# Patient Record
Sex: Male | Born: 1990 | Race: Black or African American | Hispanic: No | Marital: Single | Smoking: Never smoker
Health system: Southern US, Community
[De-identification: ages and names within clinical notes are randomized; demographics above are authoritative.]

## PROBLEM LIST (undated history)

## (undated) DIAGNOSIS — F319 Bipolar disorder, unspecified: Secondary | ICD-10-CM

## (undated) DIAGNOSIS — K5120 Ulcerative (chronic) proctitis without complications: Secondary | ICD-10-CM

## (undated) DIAGNOSIS — Z8619 Personal history of other infectious and parasitic diseases: Secondary | ICD-10-CM

## (undated) DIAGNOSIS — F909 Attention-deficit hyperactivity disorder, unspecified type: Secondary | ICD-10-CM

## (undated) DIAGNOSIS — K5190 Ulcerative colitis, unspecified, without complications: Secondary | ICD-10-CM

## (undated) HISTORY — DX: Ulcerative colitis, unspecified, without complications: K51.90

## (undated) HISTORY — DX: Attention-deficit hyperactivity disorder, unspecified type: F90.9

## (undated) HISTORY — PX: EXTERNAL EAR SURGERY: SHX627

## (undated) HISTORY — DX: Personal history of other infectious and parasitic diseases: Z86.19

## (undated) HISTORY — DX: Bipolar disorder, unspecified: F31.9

---
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
## 2009-07-24 ENCOUNTER — Ambulatory Visit: Payer: Self-pay | Admitting: Family Medicine

## 2009-07-24 LAB — CONVERTED CEMR LAB
ALT: 27 units/L (ref 0–53)
AST: 42 units/L — ABNORMAL HIGH (ref 0–37)
Albumin: 4 g/dL (ref 3.5–5.2)
Alkaline Phosphatase: 160 units/L — ABNORMAL HIGH (ref 39–117)
BUN: 16 mg/dL (ref 6–23)
Basophils Absolute: 0 10*3/uL (ref 0.0–0.1)
Basophils Relative: 0.1 % (ref 0.0–3.0)
Bilirubin, Direct: 0 mg/dL (ref 0.0–0.3)
CO2: 30 meq/L (ref 19–32)
Calcium: 9.4 mg/dL (ref 8.4–10.5)
Chloride: 107 meq/L (ref 96–112)
Creatinine, Ser: 1.1 mg/dL (ref 0.4–1.5)
Eosinophils Absolute: 0.1 10*3/uL (ref 0.0–0.7)
Eosinophils Relative: 1.8 % (ref 0.0–5.0)
GFR calc non Af Amer: 110.76 mL/min (ref >60)
Glucose, Bld: 92 mg/dL (ref 70–99)
HCT: 40.6 % (ref 39.0–52.0)
Hemoglobin: 13.8 g/dL (ref 13.0–17.0)
Lymphocytes Relative: 49.5 % — ABNORMAL HIGH (ref 12.0–46.0)
Lymphs Abs: 3.6 10*3/uL (ref 0.7–4.0)
MCHC: 34.1 g/dL (ref 30.0–36.0)
MCV: 94.8 fL (ref 78.0–100.0)
Monocytes Absolute: 0.6 10*3/uL (ref 0.1–1.0)
Monocytes Relative: 8 % (ref 3.0–12.0)
Neutro Abs: 3 10*3/uL (ref 1.4–7.7)
Neutrophils Relative %: 40.6 % — ABNORMAL LOW (ref 43.0–77.0)
Platelets: 227 10*3/uL (ref 150.0–400.0)
Potassium: 4.1 meq/L (ref 3.5–5.1)
RBC: 4.28 M/uL (ref 4.22–5.81)
RDW: 11.8 % (ref 11.5–14.6)
Sodium: 142 meq/L (ref 135–145)
TSH: 2.04 microintl units/mL (ref 0.35–5.50)
Total Bilirubin: 1 mg/dL (ref 0.3–1.2)
Total Protein: 6.8 g/dL (ref 6.0–8.3)
WBC: 7.3 10*3/uL (ref 4.5–10.5)

## 2009-07-30 ENCOUNTER — Ambulatory Visit: Payer: Self-pay | Admitting: Family Medicine

## 2009-12-04 ENCOUNTER — Emergency Department (HOSPITAL_COMMUNITY): Admission: EM | Admit: 2009-12-04 | Discharge: 2009-12-04 | Payer: Self-pay | Admitting: Emergency Medicine

## 2010-07-09 ENCOUNTER — Encounter (INDEPENDENT_AMBULATORY_CARE_PROVIDER_SITE_OTHER): Payer: Self-pay | Admitting: *Deleted

## 2010-09-09 ENCOUNTER — Ambulatory Visit: Payer: Self-pay | Admitting: Family Medicine

## 2010-12-22 ENCOUNTER — Ambulatory Visit: Admit: 2010-12-22 | Payer: Self-pay | Admitting: Family Medicine

## 2010-12-23 ENCOUNTER — Encounter: Payer: Self-pay | Admitting: Family Medicine

## 2010-12-23 ENCOUNTER — Ambulatory Visit
Admission: RE | Admit: 2010-12-23 | Discharge: 2010-12-23 | Payer: Self-pay | Source: Home / Self Care | Attending: Family Medicine | Admitting: Family Medicine

## 2010-12-24 LAB — CONVERTED CEMR LAB: Chlamydia, Swab/Urine, PCR: NEGATIVE

## 2011-01-04 LAB — CONVERTED CEMR LAB
Bilirubin Urine: NEGATIVE
Blood in Urine, dipstick: NEGATIVE
Chlamydia, Swab/Urine, PCR: POSITIVE — AB
GC Probe Amp, Urine: NEGATIVE
Glucose, Urine, Semiquant: NEGATIVE
Ketones, urine, test strip: NEGATIVE
Nitrite: NEGATIVE
Specific Gravity, Urine: >=1.03
Urobilinogen, UA: 0.2
WBC Urine, dipstick: NEGATIVE
pH: 5

## 2011-01-08 NOTE — Assessment & Plan Note (Signed)
Summary: BURNING WHEN URINATE X  1 WK .Marland KitchenCYD   Vital Signs:  Patient profile:   20 year old male Height:      69.5 inches Weight:      172 pounds BMI:     25.13 Temp:     97.8 degrees F oral Pulse rate:   68 / minute Pulse rhythm:   regular BP sitting:   122 / 82  (left arm) Cuff size:   regular  Vitals Entered By: Sherrian Divers CMA Deborra Medina) (September 09, 2010 10:38 AM) CC: burning upon urination   History of Present Illness: 20 yo here for dysuria.  Stared one week ago, not occuring every time he urinates. No back pain, fevers, chills, nausea or vomiting. Never had anything like this before. Sexually active but wears condoms with each encounter. No discharge from his penis or penile sores. No visible hematuria.  Current Medications (verified): 1)  None  Allergies (verified): No Known Drug Allergies  Past History:  Past Surgical History: Last updated: 07/30/2009 CHARTER ADMISSION:X 1 HBZJ:(6967) L EAR KELOID EXCISION (DR [facility]) 10/2007  Family History: Last updated: 07/30/2009 Father:A 38 (Estranged) [place] ; ELEVATED HEART RATE H/O OF DRUG PROBLEMS  Mother: A 63  RAD; BRONCHITIS Siblings: 1 HALF SISTER DECEASED 8 YOA MVA (Zohan WAS 7 YOA) CV: +MGGM DECEASED FROM MI ;ANGINA HBP: + MOTHERS FAMILY DM: IN MATERNAL FAMILY / M UNCLE GOUT/ARTHRITIS: PROSTATE CANCER:   MGM LUNG CANCER MET. TO BRAIN BREAST/OVARIAN/UTERINE CANCER:  COLON CANCER: MG AUNT DEPRESSION: +MGM DRUG ABUSE: + FATHER OTHER: + STROKE MGM  Social History: Last updated: 07/30/2009 Mother:LIVES WITH MOTHER Father: DAD IN [place]// DOES NOT SEE ON A REGULAR BASIS Siblings:  School name: GRADUATED FROM [employer]   Hobbies: BB, FB  Risk Factors: Caffeine Use: 0 (07/30/2009) Exercise: yes (07/30/2009)  Risk Factors: Smoking Status: quit (07/30/2009) Packs/Day: minimal exposure in the past. (07/30/2009)  Review of Systems      See HPI General:  Denies chills and  fever. GI:  Denies abdominal pain, nausea, and vomiting. GU:  Complains of dysuria; denies discharge, genital sores, hematuria, incontinence, urinary frequency, and urinary hesitancy. MS:  Denies low back pain.  Physical Exam  General:  Well appearing adolescent,no acute distress Abdomen:  Bowel sounds positive,abdomen soft and non-tender without masses, organomegaly or hernias noted. No CVA tenderness Psych:  Cognition and judgment appear intact. Alert and cooperative with normal attention span and concentration. No apparent delusions, illusions, hallucinations, good eye contact, good interaction.   Impression & Recommendations:  Problem # 1:  DYSURIA (ICD-788.1) Assessment New UA neg for infection, will send for culture. Will also check urine GC/Chlamydia.  Orders: UA Dipstick w/o Micro (manual) (81002) T-Chlamydia & GC Probe, Urine ([id number]) T-Culture, Urine ([id number])  Problem # 2:  ? of SEXUALLY TRANSMITTED DISEASE, EXPOSURE TO (ICD-V01.6) Assessment: New Check HIV and RPR as well today. Discussed sexual activity and STD risk.   Orders: Venipuncture (02585) T-HIV Antibody  (Reflex) [id number]) T-RPR (Syphilis) [phone])  Prior Medications (reviewed today): None Current Allergies (reviewed today): No known allergies   Laboratory Results   Urine Tests  Date/Time Received: September 09, 2010 10:48 AM   Routine Urinalysis   Color: yellow Appearance: Cloudy Glucose: negative   (Normal Range: Negative) Bilirubin: negative   (Normal Range: Negative) Ketone: negative   (Normal Range: Negative) Spec. Gravity: >=1.030   (Normal Range: 1.003-1.035) Blood: negative   (Normal Range: Negative) pH: 5.0   (Normal Range:  5.0-8.0) Protein: trace   (Normal Range: Negative) Urobilinogen: 0.2   (Normal Range: 0-1) Nitrite: negative   (Normal Range: Negative) Leukocyte Esterace: negative   (Normal Range: Negative)

## 2011-01-08 NOTE — Assessment & Plan Note (Signed)
Summary: DIARRHEA/CLE   Vital Signs:  Patient Profile:   20 Years Old Male Weight:      147.38 pounds Temp:     98 degrees F oral Pulse rate:   72 / minute Pulse rhythm:   regular BP sitting:   102 / 64  (left arm) Cuff size:   regular  Vitals Entered ByMarland Kitchen Christena Deem (January 04, 2008 4:05 PM)                 Chief Complaint:  Diarrhea.  Acute Pediatric Visit History:      The patient presents with abdominal pain, diarrhea, and headache.  These symptoms began one day ago.  He is not having fever.  Other comments include: LIGHTHEADNESS MOTHER WITH VIRUS , SICK TODAY.        There have been 4 diarrheal stools and 0 episodes of vomiting in the past 24 hours.        The abdominal pain has been present for the past day.  There is a history of anorexia.  The patient has no history of bloody stools.        Comments: 6/10 on pain scale in RUQ, diarhea started first then pain, no relief with BMs, constant.        Current Allergies (reviewed today): No known allergies      Physical Exam  General:      Well appearing adolescent,no acute distress Lungs:      Clear to ausc, no crackles, rhonchi or wheezing, no grunting, flaring or retractions  Heart:      RRR without murmur  Abdomen:      mild umbilical pain, right lateral, no rebound, no guarding no toxic appearing     Impression & Recommendations:  Problem # 1:  GASTROENTERITIS (ICD-558.9) likely viral.  Discussed s/s of appendicitis which are not present now, but he will call if severe RLQ pain, N, vomiting and fever.  In meantime treat supportively with plenty of fluids, rest.  Call if not improving. Orders: Est. Patient Level III (36859)       ] Prior Medications (reviewed today): None Current Allergies (reviewed today): No known allergies  Current Medications (including changes made in today's visit):  None

## 2011-01-08 NOTE — Letter (Signed)
Summary: Nadara Eaton letter  [place] at [facility]  [address] [place], [place] [address]   Phone: [phone]  Fax: [phone]       07/09/2010 MRN: [id number]  [facility] [address] [place], [place]  [address]  Dear Mr. [facility] [place] [place] announce the retirement of Arta Silence, M.D., from full-time practice at the [facility] office effective June 05, 2010 and his plans of returning part-time.  It is important to Dr. Hetty Ely and to our practice that you understand that [facility] has seven physicians in our office for your health care needs.  We will continue to offer the same exceptional care that you have today.    Dr. Hetty Ely has spoken to many of you about his plans for retirement and returning part-time in the fall.   We will continue to work with you through the transition to schedule appointments for you in the office and meet the high standards that [facility] is committed to.   Again, it is with great pleasure that we share the news that Dr. Hetty Ely will return to [facility] in October of 2011 with a reduced schedule.    If you have any questions, or would like to request an appointment with one of our physicians, please call us at [phone] and press the option for Scheduling an appointment.  We take pleasure in providing you with excellent patient care and look forward to seeing you at your next office visit.  Our [facility] Physicians are:  Tillman Abide, M.D. Laurita Quint, M.D. Roxy Manns, M.D. Kerby Nora, M.D. Hannah Beat, M.D. Ruthe Mannan, M.D. We proudly welcomed Raechel Ache, M.D. and Eustaquio Boyden, M.D. to the practice in July/August 2011.  Sincerely,  [facility]

## 2011-01-08 NOTE — Letter (Signed)
Summary: Out of School  [place] at [facility]  [address] [place],  [address]   Phone: [phone]  Fax: [phone]    January 04, 2008   Student:  Andres Labrum    To Whom It May Concern:   For Medical reasons, please excuse the above named student from school for the following dates:  Start:   January 04, 2008  End:    January 04, 2008  If you need additional information, please feel free to contact our office.   Sincerely,    Jinny Sanders, M.D.  AEB:lsf    ****This is a legal document and cannot be tampered with.  Schools are authorized to verify all information and to do so accordingly.

## 2011-01-08 NOTE — Consult Note (Signed)
Summary: [facility]/Consultation Bee   Imported By: Genice Rouge 04/18/2008 14:39:54  _____________________________________________________________________  External Attachment:    Type:   Image     Comment:   External Document

## 2011-01-08 NOTE — Progress Notes (Signed)
Summary: ? flu  Phone Note Call from Patient   Caller: Mom- blanche [phone] Call For: Raenette Rover MD Summary of Call: Pt suddenly came down with body aches around lunch today, and says he feels hot. Chest is hurting.  Mom is asking if tamiflu can be called in to [facility].  Advised fluids, rest. Initial call taken by: Marty Heck,  November 07, 2008 2:42 PM  Follow-up for Phone Call        I spoke with Bradford directly and discussed flu sxs...he could very well have flu. Stay home away from others as much as possible, keep fluid intake up and call if develops productive cough with fever that doesn't go away. He will feel like he has been run over by a Mack truck if he truly has the flu. Follow-up by: Raenette Rover MD,  November 07, 2008 5:17 PM

## 2011-01-08 NOTE — Assessment & Plan Note (Signed)
Summary: CPX/CLE  r/s from 12/25/10   Vital Signs:  Patient profile:   20 year old male Height:      70 inches Weight:      175.8 pounds Temp:     98.1 degrees F oral Pulse rate:   66 / minute Pulse rhythm:   regular BP sitting:   100 / 60  (left arm) Cuff size:   regular  Vitals Entered By: Sydell Axon LPN (December 23, 2010 3:31 PM) CC: 30 Minute checkup   History of Present Illness: Pt here for Comp Exam. He is going to [employer] for two years and then hoping to transfer to [employer] to play basketball. He has not totally given up going into AF. He is wondering about going pro in BB. His uncle knows  the head coach at [employer]. His uncle had played for [employer]. He has congestion today. He had been checked for Chlamydia and was positive. He was treated and is to be rechecked.  His girlfriend has had 4 miscarriages thusfar, she is 40. She had Chlamydia.   Preventive Screening-Counseling & Management  Alcohol-Tobacco     Alcohol drinks/day: <1     Alcohol type: [place] (Vodka)      Smoking Status: quit     Packs/Day: minimal exposure in the past.  Caffeine-Diet-Exercise     Caffeine use/day: 0     Does Patient Exercise: yes     Type of exercise: gym      Times/week: 6  Problems Prior to Update: 1)  ? 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 Take As Directed  Current Medications (verified): 1)  None  Allergies: No Known Drug Allergies  Past History:  Past Surgical History: Last updated: 07/30/2009 CHARTER ADMISSION:X 1 OACZ:(6606) L EAR KELOID EXCISION (DR [facility]) 10/2007  Family History: Last updated: 12/23/2010 Father:A 51 (Estranged) [employer] [employer] ; ELEVATED HEART RATE PRESUMED H/O  HEPATITIS H/O OF DRUG PROBLEMS  Mother: A 48  RAD; BRONCHITIS Siblings: 1 HALF SISTER DECEASED 8 YOA MVA ([place] WAS 7 YOA) CV: +MGGM DECEASED FROM MI ;ANGINA HBP: + MOTHERS FAMILY DM: IN MATERNAL FAMILY / M UNCLE GOUT/ARTHRITIS: PROSTATE CANCER:   MGM LUNG CANCER MET. TO BRAIN BREAST/OVARIAN/UTERINE CANCER:  COLON CANCER: MG AUNT DEPRESSION: +MGM DRUG ABUSE: + FATHER OTHER: + STROKE MGM  Social History: Last updated: 12/23/2010 Mother:LIVES WITH MOTHER Father: DAD IN [place]// DOES NOT SEE ON A REGULAR BASIS Siblings:  School name: GRADUATED FROM [employer]   Going to [employer] Hobbies: BB, FB  Risk Factors: Alcohol Use: <1 (12/23/2010) Caffeine Use: 0 (12/23/2010) Exercise: yes (12/23/2010)  Risk Factors: Smoking Status: quit (12/23/2010) Packs/Day: minimal exposure in the past. (12/23/2010)  Family History: Father:A 51 (Estranged) [employer] PO[employer]IVER [employer] ; ELEVATED HEART RATE PRESUMED H/O HEPATITIS H/O OF DRUG PROBLEMS  Mother: A 48  RAD; BRONCHITIS Siblings: 1 HALF SISTER DECEASED 8 YOA MVA (Haik WAS 7 YOA) CV: +MGGM DECEASED FROM MI ;ANGINA HBP: + MOTHERS FAMILY DM: IN MATERNAL FAMILY / M UNCLE GOUT/ARTHRITIS: PROSTATE CANCER:   MGM LUNG CANCER MET. TO BRAIN BREAST/OVARIAN/UTERINE CANCER:  COLON CANCER: MG AUNT DEPRESSION: +MGM DRUG ABUSE: + FATHER OTHER: + STROKE MGM  Social History: Mother:LIVES WITH MOTHER  Father: DAD IN [place]// DOES NOT SEE ON A REGULAR BASIS Siblings:  School name: GR[employer]UATED FROM [employer]   Going to [employer] Hobbies: BB, FB  Review of Systems General:  Denies chills, fatigue, fever, sweats, weakness, and weight loss. Eyes:  Denies blurring, discharge, and eye pain. ENT:  Denies decreased hearing, ear discharge, earache, and ringing in ears. CV:  Denies chest pain or discomfort, fainting, fatigue, shortness of breath with exertion, swelling of feet, and swelling of hands. Resp:  Denies cough, shortness of breath, and  wheezing. GI:  Denies abdominal pain, bloody stools, change in bowel habits, constipation, dark tarry stools, diarrhea, indigestion, loss of appetite, nausea, vomiting, vomiting blood, and yellowish skin color. GU:  Denies discharge, dysuria, nocturia, and urinary frequency. MS:  Denies joint pain, low back pain, muscle aches, cramps, and stiffness. Derm:  Denies dryness, itching, and rash. Neuro:  Denies numbness, poor balance, tingling, and tremors.  Physical Exam  General:  Well-developed,well-nourished,in no acute distress; alert,appropriate and cooperative throughout examination, attentive and responsive. Head:  Normocephalic and atraumatic without obvious abnormalities. No apparent alopecia. Sinuses NT. Eyes:  Conjunctiva clear bilaterally.  Ears:  External ear exam shows no significant lesions or deformities.  Otoscopic examination reveals clear canals, tympanic membranes are intact bilaterally without bulging, retraction, inflammation or discharge. Hearing is grossly normal bilaterally. Nose:  External nasal examination shows no deformity or inflammation. Nasal mucosa are pink and moist without lesions or exudates. Mouth:  Oral mucosa and oropharynx without lesions or exudates.  Teeth in good repair. Neck:  No deformities, masses, or tenderness noted. Chest Wall:  No deformities, masses, tenderness or gynecomastia noted. Breasts:  No masses or gynecomastia noted Lungs:  Normal respiratory effort, chest expands symmetrically. Lungs are clear to auscultation, no crackles or wheezes. Heart:  Normal rate and regular rhythm. S1 and S2 normal without gallop, murmur, click, rub or other extra sounds. Abdomen:  Bowel sounds positive,abdomen soft and non-tender without masses, organomegaly or hernias noted. No CVA tenderness Rectal:  Not done due to age. Genitalia:  Testes bilaterally descended without nodularity, tenderness or masses. No scrotal masses or lesions. No penis lesions or urethral  discharge. Tanner V. "mass" felt is epididymus, NT. Msk:  No deformity or scoliosis noted of thoracic or lumbar spine.   Pulses:  R and L carotid,radial,femoral,dorsalis pedis and posterior tibial pulses are full and equal bilaterally Extremities:  No clubbing, cyanosis, edema, or deformity noted with normal full range of motion of all joints.   Neurologic:  No cranial nerve deficits noted. Station and gait are normal. Sensory, motor and coordinative functions appear intact. Skin:  Intact without suspicious lesions or rashes, multiple tattos. Cervical Nodes:  No lymphadenopathy noted Inguinal Nodes:  No significant adenopathy Psych:  Cognition and judgment appear intact. Alert and cooperative with normal attention span and concentration. No apparent delusions, illusions, hallucinations, good eye contact, good interaction.   Impression & Recommendations:  Problem # 1:  HEALTH MAINTENANCE EXAM (ICD-V70.0) Assessment Comment Only  Reviewed preventive care protocols, scheduled due services, and updated immunizations.  Problem # 2:  KELOID SCAR, STEROID INJECTIONS (ICD-701.4) Assessment: Improved Flat, in front of left earlobe.  Problem # 3:  ADHD (ICD-314.01) Assessment: Unchanged Seems stable. Going to school at [employer] and motivated.  Problem # 4:  ? of SEXUALLY TRANSMITTED DISEASE, EXPOSURE TO (ICD-V01.6) Assessment: Improved Recieved trmt. Now here for recheck. Urine probe sent. Will report via phone tree. Orders: T-Chlamydia  Probe, urine [phone])  Patient Instructions: 1)  TOC urine sent for Chlamydia. 2)  RTC as needed.   Orders Added: 1)  T-Chlamydia  Probe, urine [phone] 2)  Est. Patient 18-39 years [[id number]]    Prior Medications (reviewed today): None Current Allergies (reviewed today): No known allergies

## 2011-01-08 NOTE — Assessment & Plan Note (Signed)
Summary: PAIN L BREAST/CLE   Vital Signs:  Patient Profile:   20 Years Old Male Height:     69.50 inches (140.97 cm) Weight:      152 pounds Temp:     96 degrees F tympanic Pulse rate:   68 / minute Pulse rhythm:   regular BP sitting:   110 / 70  (left arm) Cuff size:   regular  Vitals Entered ByGwinda Maine (March 06, 2008 4:07 PM)                 Chief Complaint:  BREAST PAIN// AND OTHER PROBLEMS.  History of Present Illness: Pt is here with mother. He was seen a few years ago (03/16/05)for left breast mass felt to be a cyst after hormonal breast hypertrophy the previous year resolved spontaneously on its own. He has done well with that but c/o pain in the left breast around the areolar area for the last few weeks and demonstrates where it hurts by squeezing the left areola. He denies trauma. He also complains of frontal headaches and low back pain.  His mother relates that he spendas lots of time lounging in various positions on his bed going back and forth between computer screen and television screen playing computer games. He denies visual acuity problems and does not think his frontal headaches are related tohis vision. He does do a fair amount of lat eye movement going back and forth between the two screens.    Prior Medications Reviewed Using: Patient Recall  Current Allergies: No known allergies         Impression & Recommendations:  Problem # 1:  BREAST PAIN, LEFT (ICD-611.71) Reassured, no pathol;ogical condition found. Orders: Est. Patient Level IV (99872)   Problem # 2:  BACK PAIN, LUMBAR (ICD-724.2) Appears benign but due to Chiropracter trmt, will have pt see ortho for diagnostic xrays to eval and treat. Orders: Orthopedic Referral (Ortho) Est. Patient Level IV ([id number])   Problem # 3:  FACIAL PAIN, FRONTAL (ICD-784.0) Assessment: New Suggested using guaif to relieve any congestion in the area and as needed tyl for discomfort. Also suggested  that the pt get more regular aerobic exercise, playing ball, running, etc. Orders: Est. Patient Level IV (76184)    Patient Instructions: 1)  Refer to [facility]. 2)  For congestion, take: 3)  GUAIFENESIN  624m by mouth AM and NOON   4)    ROBITUSSIN PLAIN (NO LETTERS, NO NAMES) two tablespoons  or 5)    [facility] MUCOUS RELIEF EXPECTORANT (400 mg) 11/2 TABS  or 6)    GUAIFENESIN (200 MG) 3 TABS  7)  Drink Lots of Fluids.        8)  45 mins spent with pt and mother.                ]

## 2011-01-08 NOTE — Assessment & Plan Note (Signed)
Summary: CPX/CLE   Vital Signs:  Patient profile:   20 year old male Height:      69.5 inches Weight:      168 pounds BMI:     24.54 Temp:     98 degrees F Pulse rate:   60 / minute Pulse rhythm:   regular BP sitting:   120 / 80  (left arm) Cuff size:   regular  Vitals Entered By: Lewanda Rife LPN (July 30, 2009 1:58 PM)  History of Present Illness: Pt here for CXomp Exam, hasn't had one in a while. He has no complaints and feels well. 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 He wants to enlist in the [employer].    Preventive Screening-Counseling & Management  Alcohol-Tobacco     Smoking Status: quit     Packs/Day: minimal exposure in the past.  Caffeine-Diet-Exercise     Caffeine use/day: 0     Does Patient Exercise: yes     Type of exercise: gym      Times/week: 6  Problems Prior to Update: 1)  Facial Pain, Frontal  (ICD-784.0) 2)  Breast Pain, Left  (ICD-611.71) 3)  Back Pain, Lumbar  (ICD-724.2) 4)  Bipolar Disorder Unspecified  (ICD-296.80) 5)  Adhd  (ICD-314.01)  Medications Prior to Update: 1)  None  Allergies (verified): No Known Drug Allergies  Past History:  Family History: Last updated: 07/30/2009 Father:A 50 (Estranged) UNCH POOR LIVER FCTN ; ELEVATED HEART RATE H/O OF DRUG PROBLEMS  Mother: A 46  RAD; BRONCHITIS Siblings: 1 HALF SISTER DECEASED 8 YOA MVA (Sadler WAS 7 YOA) CV: +MGGM DECEASED FROM MI ;ANGINA HBP: + MOTHERS FAMILY DM: IN MATERNAL FAMILY / M UNCLE GOUT/ARTHRITIS: PROSTATE CANCER:   MGM LUNG CANCER MET. TO BRAIN BREAST/OVARIAN/UTERINE CANCER:  COLON CANCER: MG AUNT DEPRESSION: +MGM DRUG ABUSE: + FATHER OTHER: + STROKE MGM  Social History: Last updated: 07/30/2009 Mother:LIVES WITH MOTHER Father: DAD IN [place]// DOES NOT SEE ON A REGULAR BASIS Siblings:  School name: GRADUATED FROM [employer], [employer]   Hobbies: BB, FB  Risk Factors: Caffeine Use: 0 (07/30/2009) Exercise: yes  (07/30/2009)  Risk Factors: Smoking Status: quit (07/30/2009) Packs/Day: minimal exposure in the past. (07/30/2009)  Past Surgical History: CHARTER ADMISSION:X 1 ZOXW:(9604) L EAR KELOID EXCISION (DR [place]) 10/2007  Family History: Father:A 50 (Estranged) UNCH POOR LIVER FCTN ; ELEVATED HEART RATE H/O OF DRUG PROBLEMS  Mother: A 46  RAD; BRONCHITIS Siblings: 1 HALF SISTER DECEASED 8 YOA MVA (Trashawn WAS 7 YOA) CV: +MGGM DECEASED FROM MI ;ANGINA HBP: + MOTHERS FAMILY DM: IN MATERNAL FAMILY / M UNCLE GOUT/ARTHRITIS: PROSTATE CANCER:   MGM LUNG CANCER MET. TO BRAIN BREAST/OVARIAN/UTERINE CANCER:  COLON CANCER: MG AUNT DEPRESSION: +MGM DRUG ABUSE: + FATHER OTHER: + STROKE MGM  Social History: Mother:LIVES WITH MOTHER Father: DAD IN [place]// DOES NOT SEE ON A REGULAR BASIS Siblings:  School name: GRADUATED FROM [employer], [employer]   Hobbies: BB, FB Smoking Status:  quit Packs/Day:  minimal exposure in the past. Caffeine use/day:  0 Does Patient Exercise:  yes  Review of Systems General:  Denies chills, fatigue, fever, loss of appetite, malaise, sleep disorder, sweats, weakness, and weight loss. Eyes:  Denies blurring, discharge, double vision, eye irritation, eye pain, halos, itching, light sensitivity, red eye, vision loss-1 eye, and vision loss-both eyes. ENT:  Denies decreased hearing, difficulty swallowing, ear discharge, earache, hoarseness, nasal congestion, nosebleeds, postnasal drainage, ringing in ears,  sinus pressure, and sore throat. CV:  Denies bluish discoloration of lips or nails, chest pain or discomfort, difficulty breathing at night, difficulty breathing while lying down, fainting, fatigue, leg cramps with exertion, lightheadness, near fainting, palpitations, shortness of breath with exertion, swelling of feet, swelling of hands, and weight gain. Resp:  Denies chest discomfort, chest pain with inspiration, cough, coughing up blood, excessive snoring,  hypersomnolence, morning headaches, pleuritic, shortness of breath, sputum productive, and wheezing. GI:  Denies abdominal pain, bloody stools, change in bowel habits, constipation, dark tarry stools, diarrhea, excessive appetite, gas, hemorrhoids, indigestion, loss of appetite, nausea, vomiting, vomiting blood, and yellowish skin color. GU:  Denies decreased libido, discharge, dysuria, erectile dysfunction, genital sores, hematuria, incontinence, nocturia, urinary frequency, and urinary hesitancy. MS:  Denies joint pain, joint redness, joint swelling, loss of strength, low back pain, mid back pain, muscle aches, muscle , cramps, muscle weakness, stiffness, and thoracic pain. Neuro:  Denies brief paralysis, difficulty with concentration, disturbances in coordination, falling down, headaches, inability to speak, memory loss, numbness, poor balance, seizures, sensation of room spinning, tingling, tremors, visual disturbances, and weakness.  Physical Exam  General:  Well appearing adolescent,no acute distress Head:  Normocephalic and atraumatic without obvious abnormalities. No apparent alopecia or balding. Sinuses NT. Eyes:  Conjunctiva clear bilaterally.  Ears:  External ear exam shows no significant lesions or deformities.  Otoscopic examination reveals clear canals, tympanic membranes are intact bilaterally without bulging, retraction, inflammation or discharge. Hearing is grossly normal bilaterally. Nose:  External nasal examination shows no deformity or inflammation. Nasal mucosa are pink and moist without lesions or exudates. Mouth:  Oral mucosa and oropharynx without lesions or exudates.  Teeth in good repair. Neck:  No deformities, masses, or tenderness noted. Chest Wall:  No deformities, masses, tenderness or gynecomastia noted. Breasts:  No masses or gynecomastia noted Lungs:  Normal respiratory effort, chest expands symmetrically. Lungs are clear to auscultation, no crackles or  wheezes. Heart:  Normal rate and regular rhythm. S1 and S2 normal without gallop, murmur, click, rub or other extra sounds. Abdomen:  Bowel sounds positive,abdomen soft and non-tender without masses, organomegaly or hernias noted. Genitalia:  Testes bilaterally descended without nodularity, tenderness or masses. No scrotal masses or lesions. No penis lesions or urethral discharge. Tanner V. Msk:  No deformity or scoliosis noted of thoracic or lumbar spine.   Pulses:  R and L carotid,radial,femoral,dorsalis pedis and posterior tibial pulses are full and equal bilaterally Extremities:  No clubbing, cyanosis, edema, or deformity noted with normal full range of motion of all joints.   Neurologic:  No cranial nerve deficits noted. Station and gait are normal. Plantar reflexes are down-going bilaterally. DTRs are symmetrical throughout. Sensory, motor and coordinative functions appear intact. Skin:  Intact without suspicious lesions or rashes, 3 tattos. Cervical Nodes:  No lymphadenopathy noted Inguinal Nodes:  No significant adenopathy Psych:  Cognition and judgment appear intact. Alert and cooperative with normal attention span and concentration. No apparent delusions, illusions, hallucinations, good eye contact, good interaction.   Impression & Recommendations:  Problem # 1:  HEALTH MAINTENANCE EXAM (ICD-V70.0) Assessment Comment Only Will need Tdap next time seen.  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  Problem # 4:  ADHD (ICD-314.01) Assessment: Improved Seems well adjusted at this point. Able to sit and talk very easily.  Prior Medications (reviewed today): None Current Allergies (reviewed today): No  known allergies

## 2011-03-09 LAB — POCT I-STAT, CHEM 8
BUN: 17 mg/dL (ref 6–23)
Calcium, Ion: 1.09 mmol/L — ABNORMAL LOW (ref 1.12–1.32)
Chloride: 103 mEq/L (ref 96–112)
Creatinine, Ser: 1.4 mg/dL (ref 0.4–1.5)
Glucose, Bld: 107 mg/dL — ABNORMAL HIGH (ref 70–99)
HCT: 38 % — ABNORMAL LOW (ref 39.0–52.0)
Hemoglobin: 12.9 g/dL — ABNORMAL LOW (ref 13.0–17.0)
Potassium: 3.7 mEq/L (ref 3.5–5.1)
Sodium: 136 mEq/L (ref 135–145)
TCO2: 25 mmol/L (ref 0–100)

## 2011-03-09 LAB — DIFFERENTIAL
Basophils Absolute: 0 10*3/uL (ref 0.0–0.1)
Basophils Relative: 0 % (ref 0–1)
Eosinophils Absolute: 0 10*3/uL (ref 0.0–0.7)
Eosinophils Relative: 0 % (ref 0–5)
Lymphocytes Relative: 9 % — ABNORMAL LOW (ref 12–46)
Lymphs Abs: 0.9 10*3/uL (ref 0.7–4.0)
Monocytes Absolute: 0.6 10*3/uL (ref 0.1–1.0)
Monocytes Relative: 6 % (ref 3–12)
Neutro Abs: 8.5 10*3/uL — ABNORMAL HIGH (ref 1.7–7.7)
Neutrophils Relative %: 84 % — ABNORMAL HIGH (ref 43–77)

## 2011-03-09 LAB — CBC
HCT: 37.8 % — ABNORMAL LOW (ref 39.0–52.0)
Hemoglobin: 13 g/dL (ref 13.0–17.0)
MCHC: 34.4 g/dL (ref 30.0–36.0)
MCV: 93.2 fL (ref 78.0–100.0)
Platelets: 220 10*3/uL (ref 150–400)
RBC: 4.06 MIL/uL — ABNORMAL LOW (ref 4.22–5.81)
RDW: 12.6 % (ref 11.5–15.5)
WBC: 10.2 10*3/uL (ref 4.0–10.5)

## 2011-03-09 LAB — D-DIMER, QUANTITATIVE (NOT AT ARMC): D-Dimer, Quant: <0.22 ug/mL-FEU (ref 0.00–0.48)

## 2011-04-21 NOTE — Op Note (Signed)
[name], Kenneth Terrell             ACCOUNT NO.:  [id number]   MEDICAL RECORD NO.:  [id number]          PATIENT TYPE:  AMB   LOCATION:  [place]                          FACILITY:  [facility]   PHYSICIAN:  Berneta Sages L. 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 Resistant to  conservative treatment as well as previous excision with recurrence. The  patient is now being prepared for excision of the area with plaster  reconstruction.   The procedure in detail were explained the patient's mother and the  patient as well preoperatively including the risks and complications and  they consented for surgery.   PROCEDURES DONE:  Wide excision of scar cicatrix measured approximately  4 cm in length and 4-1/2 cm in width. Plaster reconstruction.   ANESTHESIA:  General.   The patient underwent general anesthesia and intubated orally. Prep was  done to the face and scalp areas with Betadine solution. Walled off with  sterile towels and drapes so as to make a sterile field. One half  percent Xylocaine with Epinephrine 1:200,000 concentration was injected  locally for vasoconstriction. After waiting an appropriate amount of  time, I was able to excise the lesion all the way down to the underlying  deep superficial fascia.  After hemostasis, the flaps were transposed  after being freed up approximately 2 cm laterally and medially with the  Bovie unit of coagulation. Deep closure was done with 2-0 Monocryl x 2  layers, a subdermal suture of 3-0 Monocryl and then a running  subcuticular stitch of 3-0 Monocryl.  Quarter inch Steri-Strips and a  soft dressing were applied including Xeroform, 4 x 4, ABDs. He withstood  the procedures very well, was taken to the recovery room in excellent  condition. Estimated blood loss less than 50 cc. Complications none.      Odella Aquas. Towanda Malkin, M.D.  Electronically Signed     GLT/MEDQ  D:  12/05/2007  T:  12/05/2007  Job:  [id number]

## 2011-04-24 NOTE — Op Note (Signed)
Kenneth Terrell, Kenneth Terrell             ACCOUNT NO.:  [id number]   MEDICAL RECORD NO.:  [id number]          PATIENT TYPE:  AMB   LOCATION:  [place]                          FACILITY:  [facility]   PHYSICIAN:  Berneta Sages L. Trues[place], M.D.DATE OF BIRTH:  09/30/1991   DATE OF PROCEDURE:  09/20/2006  DATE OF DISCHARGE:                                 OPERATIVE REPORT   A 20 year old who has a keloid involving his left facial area that has been  nonresponsive to conservative treatment, including steroid creams,  injections multiple times, and massage.   PROCEDURES PLANNED:  1. Excision of keloid.  Symptoms:  Increased burning and itching,      refractory to conservative treatment.  2. Plastic closure planned.   The patient was taken to the operating room, placed on the operating room  table in the supine position, was given adequate general anesthesia,  intubated orally.  Prep was done to the left facial and neck areas with  Betadine solution, walled off with sterile towels and draped so as to make a  sterile field.  Xylocaine 0.5% with epinephrine was injected locally for  vasoconstriction and analgesia postop.  We used 10 mL.  The area was then  excised down the underlying subcutaneous tissue, and we removed the fill.  Next, the double hooked clips were placed and flaps were maintained and  dissected out using a Bovie anticoagulation out approximately 1 cm on each  side medially and laterally.  After proper hemostasis, the deep subcutaneous  tissues was then closed with 3-0 Monocryl x2 layers, a subdermal suture of 5-  0 Monocryl, and then a run of subcuticular stitch of 5-0 Monocryl.  The  edges of the wounds were injected with Kenalog 10 mg/mL, a total of a 0.5 mL  per side.  The wounds were cleansed.  Steri-Strips and soft dressings  applied to the areas.  Also a silicone gel patch was placed over the wound  for extra help in trying to prevent the keloid from returning.  The patient  tolerated all  the procedures very well, was taken to recovery in excellent  condition.      Odella Aquas. Towanda Malkin, M.D.  Electronically Signed     GLT/MEDQ  D:  09/20/2006  T:  09/20/2006  Job:  [id number]

## 2011-08-12 ENCOUNTER — Encounter: Payer: Self-pay | Admitting: Family Medicine

## 2011-08-13 ENCOUNTER — Ambulatory Visit (INDEPENDENT_AMBULATORY_CARE_PROVIDER_SITE_OTHER): Payer: PRIVATE HEALTH INSURANCE | Admitting: Family Medicine

## 2011-08-13 ENCOUNTER — Encounter: Payer: Self-pay | Admitting: Family Medicine

## 2011-08-13 DIAGNOSIS — J069 Acute upper respiratory infection, unspecified: Secondary | ICD-10-CM

## 2011-08-13 NOTE — Patient Instructions (Signed)
Take Guaifenesin (400mg ), take 11/2 tabs by mouth AM and NOON. Get GUAIFENESIN by  [facility] or [facility] and getting MUCOUS RELIEF EXPECTORANT/CONGESTION. DO NOT GET MUCINEX (Timed Release Guaifenesin)  Drink lots of fluids. Tylenol 325mg  1-2 tabs po q4h prn  May cont Melastonn for sleep for ass long as a month, then as needed. Gargle with 30ccs of warm salt water every half hour for 2 days as able.  May continue Ibuprofen, three 200mg  tabs after each meal. Call if sxs worsen.

## 2011-08-13 NOTE — Assessment & Plan Note (Signed)
Looks to be viral infection.  See instructions. Call back if worsens.

## 2011-08-13 NOTE — Progress Notes (Signed)
  Subjective:    Patient ID: Kenneth Terrell, male    DOB: Apr 11, 1991, 20 y.o.   MRN: [id number]  HPI Pt here for acute appt for ST, cough and runny nose. He is not going to [place], he is going to [facility]. He is having trouble with coughing at night and throughout the day with soreness of the chest and throat. His body aches and sxs began this past Sat.  He denies fever to 100. With no chills. He denies headache, ear apin 2 days ago that resolved, runny nose that is yellow and thin, ST, cough that is productive of yellow sputum, light red once. No N/V, no diarrhea. He has taken IBP, melatonin and robitussin DM.     Review of SystemsNoncontributory except as above.       Objective:   Physical Exam  Constitutional: He appears well-developed and well-nourished. No distress.  HENT:  Head: Normocephalic and atraumatic.  Right Ear: External ear normal.  Left Ear: External ear normal.  Nose: Nose normal.  Mouth/Throat: Oropharynx is clear and moist. No oropharyngeal exudate.       Mildly tender in max distribution. Mild erythema of posterior pharynx.  Eyes: Conjunctivae and EOM are normal. Pupils are equal, round, and reactive to light. Right eye exhibits no discharge. Left eye exhibits no discharge.  Neck: Normal range of motion. Neck supple.  Cardiovascular: Normal rate and regular rhythm.   Pulmonary/Chest: Effort normal and breath sounds normal. He has no wheezes.  Lymphadenopathy:    He has no cervical adenopathy.  Skin: He is not diaphoretic.          Assessment & Plan:

## 2011-08-20 ENCOUNTER — Ambulatory Visit (INDEPENDENT_AMBULATORY_CARE_PROVIDER_SITE_OTHER): Payer: PRIVATE HEALTH INSURANCE | Admitting: Family Medicine

## 2011-08-20 ENCOUNTER — Encounter: Payer: Self-pay | Admitting: Family Medicine

## 2011-08-20 DIAGNOSIS — H9190 Unspecified hearing loss, unspecified ear: Secondary | ICD-10-CM

## 2011-08-20 DIAGNOSIS — J069 Acute upper respiratory infection, unspecified: Secondary | ICD-10-CM

## 2011-08-20 NOTE — Patient Instructions (Signed)
Take Guaifenesin (400mg ), take 11/2 tabs by mouth AM and NOON. Get GUAIFENESIN by  [facility] and getting MUCOUS RELIEF EXPECTORANT/CONGESTION. DO NOT GET MUCINEX (Timed Release Guaifenesin) Drink lots of fluids. Take IBP (200mg ) take 4 at a time after brfst lunch and supper. Valsalva at least twice a day starting Sun. Call Wed if still a problem for referral to ENT.

## 2011-08-20 NOTE — Assessment & Plan Note (Signed)
Appears resolved

## 2011-08-20 NOTE — Progress Notes (Signed)
  Subjective:    Patient ID: Kenneth Terrell, male    DOB: 01-13-1991, 20 y.o.   MRN: [id number]  HPI Pt here one week after being seen for URI acutely again as acute appt for ear pain. Last time he had had some ear pain which had resolved by the time he was seen with ear exam being reasonably nml. Today he complains of difficulty in the right ear, no pain. He thinks the hearing is faded. Before he felt like something in his ear was shaking which is now better but now the hearing is bothered.  Everything else from last time has now resolved.   Review of SystemsNoncontributory except as above.       Objective:   Physical Exam  Constitutional: He appears well-developed and well-nourished. No distress.  HENT:  Head: Normocephalic and atraumatic.  Right Ear: External ear normal.  Left Ear: External ear normal.  Nose: Nose normal.  Mouth/Throat: Oropharynx is clear and moist.       Ears look nml bilat, no erythema, distortion or swelling.  Eyes: Conjunctivae and EOM are normal. Pupils are equal, round, and reactive to light. Right eye exhibits no discharge. Left eye exhibits no discharge.  Neck: Normal range of motion. Neck supple.  Cardiovascular: Normal rate and regular rhythm.   Pulmonary/Chest: Effort normal and breath sounds normal. He has no wheezes.  Lymphadenopathy:    He has no cervical adenopathy.  Skin: He is not diaphoretic.          Assessment & Plan:

## 2011-08-20 NOTE — Assessment & Plan Note (Signed)
Poss ETD with congestion behind the TM. See instructions.

## 2011-09-11 LAB — POCT HEMOGLOBIN-HEMACUE
Hemoglobin: 15.1
Operator id: 117931

## 2011-10-15 ENCOUNTER — Other Ambulatory Visit: Payer: PRIVATE HEALTH INSURANCE

## 2011-10-22 ENCOUNTER — Ambulatory Visit: Payer: PRIVATE HEALTH INSURANCE | Admitting: Family Medicine

## 2011-10-22 ENCOUNTER — Encounter: Payer: PRIVATE HEALTH INSURANCE | Admitting: Family Medicine

## 2012-01-14 ENCOUNTER — Ambulatory Visit (INDEPENDENT_AMBULATORY_CARE_PROVIDER_SITE_OTHER): Payer: PRIVATE HEALTH INSURANCE | Admitting: Family Medicine

## 2012-01-14 ENCOUNTER — Encounter: Payer: Self-pay | Admitting: Family Medicine

## 2012-01-14 DIAGNOSIS — S93409A Sprain of unspecified ligament of unspecified ankle, initial encounter: Secondary | ICD-10-CM

## 2012-01-14 DIAGNOSIS — S0181XA Laceration without foreign body of other part of head, initial encounter: Secondary | ICD-10-CM | POA: Insufficient documentation

## 2012-01-14 DIAGNOSIS — S93402A Sprain of unspecified ligament of left ankle, initial encounter: Secondary | ICD-10-CM | POA: Insufficient documentation

## 2012-01-14 DIAGNOSIS — S0180XA Unspecified open wound of other part of head, initial encounter: Secondary | ICD-10-CM

## 2012-01-14 NOTE — Assessment & Plan Note (Signed)
Out of window for suturing, likely doesn't need. Given facial hair, steri strips will not stick. Cleaned with normal saline as well and then dressed with triple abx ointment. No need for gauze.

## 2012-01-14 NOTE — Assessment & Plan Note (Addendum)
Anticipate low degree lateral ankle sprain of ATFL. Discussed this, placed in ASO brace. Strengthening exercises from [facility] pt advisor provided. Update [place] if not improving as expected.

## 2012-01-14 NOTE — Patient Instructions (Signed)
For chin - keep area covered as much as able.  Once scabs over, start using vaseline ointment to help speed healing.  Too late for sutures, doesn't seem like it will need this.  Steri strips today. For ankle - Likely had more severe lateral ankle sprain but seems to be healing well.  ASO brace for exercise.  Update [place] if not improving.  May continue ibuprofen. Good to see you today, call us with questions.

## 2012-01-14 NOTE — Progress Notes (Signed)
  Subjective:    Patient ID: Kenneth Terrell, male    DOB: [birth year], 21 y.o.   MRN: [id number]  HPI CC: check ankle and gash left chin  Rolled ankle 1 mo ago, continued pain.  Lateral ankle pain.  Worse with running.  Getting better but taking longer than in past.  Was playing basketball and rolled ankle.  Has used ice and ibuprofen.  Has rolled ankle in past but improved quicker.  Left lower chin gash - happened also playing basketball, cut jaw on another player's shoulder.  Sore.  No redness or swelling or pus.  No fevers/chills.  Used [facility] sticks to keep wound closed.  Review of Systems Per HPI    Objective:   Physical Exam  Nursing note and vitals reviewed. Constitutional: He appears well-developed and well-nourished. No distress.  Musculoskeletal:       Right ankle: Normal.       Left ankle: He exhibits normal range of motion and no swelling.       L ankle:  Tenderness at left lateral ankle at ATFL.   No increased laxity however. Neg talar tilt. 2+ DP/PT pulses. Sensation intact.  Skin: Skin is warm and dry. No rash noted.       ~1cm laceration right lower chin, no surrounding erythema or fluctuance/pus.  Most of wound covered by scab, lateral edge oozing  Psychiatric: He has a normal mood and affect.       Assessment & Plan:

## 2012-03-10 ENCOUNTER — Ambulatory Visit: Payer: PRIVATE HEALTH INSURANCE | Admitting: Family Medicine

## 2012-03-14 ENCOUNTER — Ambulatory Visit: Payer: PRIVATE HEALTH INSURANCE | Admitting: Family Medicine

## 2012-07-18 ENCOUNTER — Ambulatory Visit (INDEPENDENT_AMBULATORY_CARE_PROVIDER_SITE_OTHER): Payer: PRIVATE HEALTH INSURANCE | Admitting: Family Medicine

## 2012-07-18 ENCOUNTER — Ambulatory Visit (INDEPENDENT_AMBULATORY_CARE_PROVIDER_SITE_OTHER)
Admission: RE | Admit: 2012-07-18 | Discharge: 2012-07-18 | Disposition: A | Discharge status: Home / Self Care | Payer: PRIVATE HEALTH INSURANCE | Source: Ambulatory Visit | Attending: Family Medicine | Admitting: Family Medicine

## 2012-07-18 ENCOUNTER — Encounter: Payer: Self-pay | Admitting: Family Medicine

## 2012-07-18 VITALS — BP 108/72 | HR 56 | Temp 98.0°F | Wt 190.0 lb

## 2012-07-18 DIAGNOSIS — M25552 Pain in left hip: Secondary | ICD-10-CM

## 2012-07-18 DIAGNOSIS — M25559 Pain in unspecified hip: Secondary | ICD-10-CM

## 2012-07-18 MED ORDER — NAPROXEN 500 MG PO TABS: ORAL_TABLET | ORAL | Status: DC

## 2012-07-18 NOTE — Assessment & Plan Note (Signed)
concern for hip joint pathology given exam so xrays of left hip obtained - on my read overall normal - ?flattening of femoral head on left.  Will await radiology read.   Will treat as groin strain with ice/heat, NSAIDs and rest.   Provided with [facility] pt advisor on groin strain. If not improving in next few weeks, refer to PT vs consider sports medicine ([place]).

## 2012-07-18 NOTE — Patient Instructions (Addendum)
xrays overall stable, I want to see what radiologist thinks about one area though.  We will call you at [phone] (M) if change in plan. For now will treat as groin strain - elevate leg, use ice/heat to leg, use naprosyn twice daily with food for 5-7 days then as needed (don't take with over the counter med) Do stretching exercises provided from [facility] pt advisor. If not better in 2-3 weeks, let me know for physical therapy referral.

## 2012-07-18 NOTE — Progress Notes (Signed)
  Subjective:    Patient ID: Kenneth Terrell, male    DOB: Nov 14, 1991, 21 y.o.   MRN: [id number]  HPI CC: groin injury  Several months ago, injured left groin playing basketball.  Jumped in air, then when came down pushed left leg against ground.  With time and rest improved, but would come back intermittently esp after working out.  Squatting, any rotation of leg worsens pain.  Pain initially worsens, then improves and able to continue workouts.    Recently (2 d ago) when running in basketball, felt popping sensation left hip - has been bothering him ever since.  Worse pain with running sideways.  No h/o hip problems as child.  Using naproxyn prn.  Goes to [employer].  Review of Systems Per HPI    Objective:   Physical Exam  Vitals reviewed. Constitutional: He is oriented to person, place, and time. He appears well-developed and well-nourished. No distress.  Musculoskeletal: He exhibits no edema.       No midline spine tenderness.  Mild L paraspinous mm tightness/tenderness to palpation lumbar region.  Neg SLR bilaterally. Neg FABER bilaterally No pain at SIJ, GTB or sciatic notch. Pain localized to left hip with external rotation of left hip Strength intact bilateral lower extremities. No weakness with testing hamstrings, tender with contraction of left hamstrings  Neurological: He is alert and oriented to person, place, and time. He has normal strength. No sensory deficit. He exhibits normal muscle tone.       Assessment & Plan:

## 2012-10-09 ENCOUNTER — Encounter (HOSPITAL_COMMUNITY): Payer: Self-pay | Admitting: Emergency Medicine

## 2012-10-09 ENCOUNTER — Emergency Department (INDEPENDENT_AMBULATORY_CARE_PROVIDER_SITE_OTHER)
Admission: EM | Admit: 2012-10-09 | Discharge: 2012-10-09 | Disposition: A | Discharge status: Home / Self Care | Payer: Self-pay | Source: Home / Self Care | Attending: Emergency Medicine | Admitting: Emergency Medicine

## 2012-10-09 DIAGNOSIS — S335XXA Sprain of ligaments of lumbar spine, initial encounter: Secondary | ICD-10-CM

## 2012-10-09 MED ORDER — IBUPROFEN 800 MG PO TABS: 800.0000 mg | ORAL_TABLET | Freq: Three times a day (TID) | ORAL | Status: AC | PRN

## 2012-10-09 MED ORDER — CYCLOBENZAPRINE HCL 10 MG PO TABS: 10.0000 mg | ORAL_TABLET | Freq: Two times a day (BID) | ORAL | Status: DC | PRN
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
## 2012-10-11 ENCOUNTER — Encounter: Payer: Self-pay | Admitting: Family Medicine

## 2012-10-11 ENCOUNTER — Ambulatory Visit (INDEPENDENT_AMBULATORY_CARE_PROVIDER_SITE_OTHER): Payer: PRIVATE HEALTH INSURANCE | Admitting: Family Medicine

## 2012-10-11 ENCOUNTER — Ambulatory Visit (INDEPENDENT_AMBULATORY_CARE_PROVIDER_SITE_OTHER)
Admission: RE | Admit: 2012-10-11 | Discharge: 2012-10-11 | Disposition: A | Discharge status: Home / Self Care | Payer: PRIVATE HEALTH INSURANCE | Source: Ambulatory Visit | Attending: Family Medicine | Admitting: Family Medicine

## 2012-10-11 VITALS — BP 122/80 | HR 60 | Temp 98.1°F | Wt 186.0 lb

## 2012-10-11 DIAGNOSIS — M545 Low back pain, unspecified: Secondary | ICD-10-CM
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
## 2012-10-11 NOTE — Patient Instructions (Signed)
I think you have lower back sprain with significant left sided muscle tightness as well as sacroiliac joint inflammation. Continue ibuprofen 800mg  three times a day with food for next 5 days as well as fill flexeril previously prescribed. Use crutches for the next week. Do stretching exercises provided.

## 2012-10-13 ENCOUNTER — Telehealth: Payer: Self-pay

## 2012-10-13 NOTE — Telephone Encounter (Signed)
Pt request copy of 10/11/12 visit for insurance purpose. Copy done and Kenneth Terrell having pt sign record release.

## 2012-10-19 ENCOUNTER — Encounter: Payer: Self-pay | Admitting: Family Medicine

## 2012-10-19 ENCOUNTER — Ambulatory Visit (INDEPENDENT_AMBULATORY_CARE_PROVIDER_SITE_OTHER): Payer: PRIVATE HEALTH INSURANCE | Admitting: Family Medicine

## 2012-10-19 VITALS — BP 110/80 | HR 64 | Temp 98.1°F | Wt 180.5 lb

## 2012-10-19 DIAGNOSIS — M545 Low back pain, unspecified: Secondary | ICD-10-CM

## 2012-10-19 DIAGNOSIS — J40 Bronchitis, not specified as acute or chronic: Secondary | ICD-10-CM

## 2012-10-19 DIAGNOSIS — N62 Hypertrophy of breast: Secondary | ICD-10-CM

## 2012-10-19 MED ORDER — AZITHROMYCIN 250 MG PO TABS: ORAL_TABLET | ORAL | Status: DC

## 2012-10-19 NOTE — Assessment & Plan Note (Signed)
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  Recommend continue monitoring of this.  Possibly due to risperdal as this is a known side effect (<4%) Discussed I don't expect any problems from this small amt of gynecomastia.

## 2012-10-19 NOTE — Assessment & Plan Note (Signed)
Prior dx of lumbar strain with some L sacroiliitis still accurate.  Advised continue stretching/strengthening exercises provided last visit. As muscle spasm/stiffness has resolved, recommend stop flexeril, may start NSAID (ibuprofen 800mg ) he has at home.   Anticipate continued improvement.  Update me if sxs worsen.

## 2012-10-19 NOTE — Patient Instructions (Signed)
For back - I think healing well.  continue exercises provided.  May stop flexeril and use ibuprofen 800mg  as needed. For cough - sounds like you are developing bronchitis, but should resolve with time and supportive care - lots of fluid and rest.  Likely viral.  If continuing into weekend and next week, fill antibiotic to take to cover any bacterial process. Currently sounds more viral. For chest - you have possibly very mild case of gynecomastia on the left, but I don't anticipate any problems from this in future.  If enlarging, let me know.

## 2012-10-19 NOTE — Assessment & Plan Note (Signed)
Anticipate viral cause - discussed this. Wasp script for zpack in case cough worsening or persisting into next week (would be week 3 of cough/illness). For now continue robitussin, fluids, rest, supportive care.

## 2012-10-19 NOTE — Progress Notes (Signed)
  Subjective:    Patient ID: Kenneth Terrell, male    DOB: [birth year], 21 y.o.   MRN: [id number]  HPI CC: recheck back, check for gynecomastia.  Presents with mom.  See prior note for details.  Continued back pain.  Movement after sitting worsens left lower back pain.  Movement and activity diminishes pain.  Taking flexeril, doesn't think helping.  No sedation with this med.    Stiffness much better.  Not using NSAIDs.  Also with bad cold recently.  sxs ongoing for the last 10 days.  Started with ST, hoarse voice, RN.  Now progressed to PNDrainage and chest congestion.  Blowing nose with bloody mucous.  Coughing with yellow mucous.  No fevers/chills, HA, ear or tooth pain.  Denies coughing fits.  Cough worse during the day.  Taking robitussin and zyrtec for cough.  Gynecomastia - Ire and mom are concerned about risperdal causing gynecomastia.  Now off this medicine.  Was on this med from age 86yo to around age 21yo.  Concern for bilateral breast enlargement, more on left side.  Feels like extra tissue present on left.    Review of Systems Per HPI    Objective:   Physical Exam  Nursing note and vitals reviewed. Constitutional: He appears well-developed and well-nourished. No distress.  HENT:  Head: Normocephalic and atraumatic.  Right Ear: Hearing, tympanic membrane, external ear and ear canal normal.  Left Ear: Hearing, tympanic membrane, external ear and ear canal normal.  Nose: Mucosal edema present. No rhinorrhea. Right sinus exhibits no maxillary sinus tenderness and no frontal sinus tenderness. Left sinus exhibits no maxillary sinus tenderness and no frontal sinus tenderness.  Mouth/Throat: Uvula is midline and mucous membranes are normal. Posterior oropharyngeal edema and posterior oropharyngeal erythema present. No oropharyngeal exudate or tonsillar abscesses.  Eyes: Conjunctivae normal and EOM are normal. Pupils are equal, round, and reactive to light. No scleral icterus.    Neck: Normal range of motion. Neck supple.  Cardiovascular: Normal rate, regular rhythm, normal heart sounds and intact distal pulses.   No murmur heard. Pulmonary/Chest: Effort normal and breath sounds normal. No respiratory distress. He has no wheezes. He has no rales. Right breast exhibits no inverted nipple, no mass, no nipple discharge and no tenderness. Left breast exhibits mass. Left breast exhibits no inverted nipple, no nipple discharge and no tenderness.         Deep cough present but lungs clear  Musculoskeletal:       No significant midline spine tenderness. Minimal L paraspinous mm tightness, improved from prior exam. Mild tenderness L>R SIJ No difficulty with movement on and off exam table. No longer using crutches.  Lymphadenopathy:    He has no cervical adenopathy.  Skin: Skin is warm and dry. No rash noted.       Assessment & Plan:

## 2012-11-01 ENCOUNTER — Ambulatory Visit (INDEPENDENT_AMBULATORY_CARE_PROVIDER_SITE_OTHER): Payer: PRIVATE HEALTH INSURANCE | Admitting: Family Medicine

## 2012-11-01 ENCOUNTER — Ambulatory Visit: Payer: PRIVATE HEALTH INSURANCE

## 2012-11-01 VITALS — BP 120/72 | HR 60 | Resp 16 | Ht 70.0 in | Wt 186.0 lb

## 2012-11-01 DIAGNOSIS — J3489 Other specified disorders of nose and nasal sinuses: Secondary | ICD-10-CM

## 2012-11-01 DIAGNOSIS — M25531 Pain in right wrist: Secondary | ICD-10-CM

## 2012-11-01 DIAGNOSIS — R42 Dizziness and giddiness: Secondary | ICD-10-CM

## 2012-11-01 DIAGNOSIS — R0981 Nasal congestion: Secondary | ICD-10-CM

## 2012-11-01 DIAGNOSIS — S060X9A Concussion with loss of consciousness of unspecified duration, initial encounter: Secondary | ICD-10-CM

## 2012-11-01 DIAGNOSIS — M25539 Pain in unspecified wrist: Secondary | ICD-10-CM

## 2012-11-01 DIAGNOSIS — Z7184 Encounter for health counseling related to travel: Secondary | ICD-10-CM

## 2012-11-01 DIAGNOSIS — Z7189 Other specified counseling: Secondary | ICD-10-CM

## 2012-11-01 MED ORDER — SCOPOLAMINE 1 MG/3DAYS TD PT72: 1.0000 | MEDICATED_PATCH | TRANSDERMAL | Status: DC

## 2012-11-01 MED ORDER — IPRATROPIUM BROMIDE 0.03 % NA SOLN: 2.0000 | Freq: Two times a day (BID) | NASAL | Status: DC

## 2012-11-01 NOTE — Progress Notes (Signed)
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 There was no loss of consciousness and he has no headache now. Furthermore he has no neck pain. Nevertheless he continues to be a little bit dizzy when he moves his head suddenly. In addition, when he fell he landed on his outstretched right hand and has some pain just proximal to the base of, dorsally.  Patient mentions that he's going on a travel cruise in 5 days and would like some increase he sickness.  Patient denies weakness, diplopia, headache, loss of hearing, tinnitus, dysphagia.  Objective: No acute distress, young adult male with good muscle definition Skin: No abrasions other than his right little finger dorsally at the PIP joint Musculoskeletal: Good range of motion of hand with some tenderness in the anatomical snuffbox of his right hand Neurologically: Patient alert and cooperative  Cranial nerves III through XII intact  Motor exam: Intact  Date: Intact  HEENT: Unremarkable with normal fundi  [facility] reading (PRIMARY) by  Dr. Milus Glazier:  Negative right wrist  Assessment: Mild concussion, wrist sprain, nasal congestion, travel advice.. It appears the patient has mild injuries at this point. I have the x-rays over read to be sure.  1. Wrist pain, right  DG Wrist Complete Right  2. Travel advice encounter  scopolamine (TRANSDERM-SCOP) 1.5 MG  3. Concussion    4. Dizziness    5. Congestion of nasal sinus  ipratropium (ATROVENT) 0.03 % nasal spray     2

## 2012-11-01 NOTE — Patient Instructions (Signed)
Traveling Outside the [place]  · See your doctor at least 4 - 6 weeks before your trip. This allows time for immunizations to take effect. If it is less than 4 weeks before you leave, you should still see your caregiver. You might still benefit from shots or medicines and information about how to protect yourself while traveling.  · Your caregiver will ask you where you intend to travel, how long you intend to stay, and whether you may visit rural areas. This determines what vaccinations should be considered. Know your travel schedule when you visit your caregiver.  · Adolescents and children should seek guidance on their vaccination status from their caregiver. So should women who are breastfeeding or pregnant, and people with altered immunity (HIV/AIDS, diabetes).  CDC RECOMMENDS THE FOLLOWING VACCINES (AS NEEDED BY AGE AND BY WORLD REGION):  · Routine Vaccines: Be up to date on your routine vaccinations. Get boosters, if needed. These include diphtheria, tetanus, and pertussis (DPT), measles, mumps, and rubella (MMR), influenza (flu), and varicella (chickenpox). Also, meningococcal, if you are 11 to 22 years of age, and zoster (shingles) if over age 60. Possibly pneumococcal, if you are a smoker or have long-term (chronic) lung or heart disease.  · Typhoid: If you are visiting low income or developing countries.  · Yellow Fever: If traveling to an area where the disease is prevalent (endemic).  · HPV: (Human Papilloma Virus), if you are 26 years old or younger and intend to be sexually active.  · Rabies: If you might be exposed to wild or domestic animals. Pre-exposure rabies vaccine is urged for people doing more than short-term travel in countries where rabies is common (including [place]).  · Polio: A single one-time booster is advised for travel to [place] and [place].  · Hepatitis A: This is routinely given to children beginning at age 2 years. It is often advised for most foreign travel, including  [place].  · Hepatitis B: This is given routinely to infants, children, and adolescents.  · Meningococcal: This is advised for travel to developing countries, where risk is high. For example, parts of sub-[place] ("meningitis belt"). [place] requires vaccine for all pilgrims attending the Hajj (religious travel to [place]).  · Malaria: A vaccine does not yet exist. Oral medicines can prevent the usual types of malaria and drug-resistant strains. The most common medicine prescribed is LARIAM (mefloquine). It is taken once weekly before, during, and after travel. Other drugs are also used for malaria prevention, including chloroquine and doxycycline.  · Japanese B Encephalitis (JE): This is a moderately toxic vaccine. Use is generally limited to travelers to [place], who will have long rural exposure to mosquitoes, in areas with high likelihood of disease spreading (such as, rice paddies).  TO STAY HEALTHY, DO:  · Wash hands often, with soap and water.  · Drink only bottled or boiled water, or carbonated (bubbly) drinks in cans or bottles. Avoid tap water, fountain drinks, and ice cubes. If not possible, make water safer by BOTH filtering through an "absolute 1-micron or less" filter AND adding iodine tablets. Such filters are found in camping and outdoor supply stores.  · When buying carbonated drinks or bottled water, always inspect the bottle seal. Make sure it has not been previously opened. This could mean it was refilled with unclean beverages or water. If you suspect a bottle seal has been tampered with, return or discard it.  · Eat only thoroughly cooked food from a reputable restaurant or   food service provider, who routinely caters to foreign travelers. Only eat meat, fish, or shellfish that have been thoroughly cooked. Otherwise, they can infect you and cause gastroenteritis.  · Avoid foods that have been prepared and left standing at room temperature. These often support bacterial growth that can make  you ill.  · If you will be visiting an area where there is risk for malaria, take your malaria prevention medicine before, during, and after travel, as directed. (See your doctor for a prescription.)  · Protect yourself from mosquito bites:  · Pay special attention to mosquito protection between dusk and dawn. This is when malaria-carrying mosquitos are active.  · Wear long-sleeved shirts, long pants, and hats.  · Use insect repellants that contain DEET (diethylmethyltoluamide).  · Read and follow the directions and precautions on the product label.  · Apply insect repellent to all exposed skin.  · Do not put repellent on wounds or broken skin.  · Do not breathe in, swallow, or get DEET in your eyes. DEET is toxic if swallowed. If using a spray product, apply DEET to your face by spraying your hands and rubbing the product carefully over the face. Avoid your eyes and mouth.  · Purchase a bed net impregnated (treated) with the insecticide permethrin or deltamethrin. Or, spray the bed net with one of these insecticides. This is not needed if you are staying in air-conditioned or well-screened housing.  · DEET may be used on adults, children, and infants older than 2 months of age. Protect infants by using a carrier draped with mosquito netting, with an elastic edge for a tight fit.  · Children under 10 years old should not apply insect repellent themselves. Do not apply to young children's hands or around their eyes and mouth.  · If you are visiting areas where malaria occurs, read the malaria prevention recommendations on the CDC malaria website (www.cdc.gov/MALARIA). Your caregiver will guide you on the selection and use of an anti-malaria preventive medicine that you may need to take before, during, and after your visit.  · To prevent fungal and parasitic infections, keep feet clean and dry. Do not go barefoot.  · Always use condoms to reduce the risk of HIV and other sexually transmitted diseases.  · Try to travel  in vehicles that have seat belts, whenever possible. If renting a vehicle, try to rent a larger one for added protection. Wear helmets whenever bicycling or motorcycling. Avoid alcohol when operating any vehicle, even a bicycle. Avoid overcrowded, over-weighted, or top heavy buses or mini-vans. Be aware that pedestrian patterns vary greatly by country.  TO AVOID GETTING SICK:  · Do not eat food purchased from street vendors.  · Eat at restaurants that often cater to foreign travelers (leading hotels, hotel chains).  · Do not drink beverages with ice.  · Do not eat dairy products, unless you know they have been pasteurized.  · Do not share needles with anyone.  · Do not handle animals (especially monkeys, dogs, and cats). Avoid bites and serious diseases (including rabies and plague).  · Do not swim in fresh water. Salt water is often safer. Avoid swimming pools that are not chlorinated.  · Do not have unprotected sex.  WHAT YOU NEED TO BRING WITH YOU:  · Long-sleeved shirt, long pants, and a hat to wear outside. This is to prevent illnesses carried by insects (malaria, dengue, filariasis, leishmaniasis, onchocerciasis).  · Contact information card, for use in urgent situations. This should list   yourself. This should include a first aid kit and commonly needed medicines. ITEMS TO INCLUDE IN A TRAVEL HEALTH KIT:  Insect repellent containing DEET.  Bed nets impregnated with permethrin. (Can be purchased in camping or [employer] supply stores. Overseas, permethrin or another insecticide, deltamethrin, may be purchased to treat bed nets and clothes.)  Flying-insect spray or mosquito coils,  to help clear rooms of mosquitoes. The product should contain a pyrethroid insecticide. These insecticides quickly kill flying insects, including mosquitoes.  Over-the-counter anti-diarrhea medicine, to take if you have diarrhea.  Iodine tablets and water filters to purify water, if bottled water is not available.  Sunblock and sunglasses.  Antibacterial hand wipes or an alcohol-based hand sanitizer. Must contain at least 60% alcohol.  Extra pair of contacts or prescription glasses, or both, for people who wear corrective lenses.  Prescription medicines. Make sure you have enough to last during your trip, as well as a copy of the prescription(s).  Destination-related medicines, if applicable:  Anti-malaria medicines.  Medicine to prevent or treat high-altitude illness.  Pain or fever medicines (acetaminophen, aspirin, ibuprofen).  Stomach upset or diarrhea medicines:  Over-the-counter anti-diarrhea medicine (loperamide, bismuth subsalicylate).  Antibiotic for self-treatment of moderate to severe diarrhea.  Oral rehydration solution packets.  Mild laxative.  Antacid.  Items to treat throat and respiratory symptoms:  Antihistamine.  Decongestant, alone or combined with antihistamine.  Cough suppressant or expectorant (promotes the expulsion of mucus).  Throat lozenges.  Anti-motion sickness medicine.  Epinephrine auto-injector (such as an EpiPen), if you have a history of severe allergic reaction. Smaller dose packages are available for children.  Any medicines, prescription or over-the-counter, taken on a regular basis at home. For Basic First Aid  Disposable gloves (at least two pairs).  Adhesive bandages, multiple sizes.  Gauze.  Adhesive tape.  Elastic bandage wrap for sprains and strains.  Antiseptic.  Cotton swabs.  Tweezers.  Scissors.  Antifungal and antibacterial ointments or creams.  1% hydrocortisone cream.  Anti-itch gel or cream, for  insect bites and stings.  Aloe gel for sunburns.  Moleskin or molefoam for blisters.  Digital thermometer.  Saline eye drops.  First-aid quick reference card.  Commercial suture and syringe kits, to be used by a local caregiver. (These items will also require a letter from the prescribing physician, on official letterhead stationery.) Note that some of the above items are considered sharp. They will need to be packed in your checked luggage, not in a carry on.  AFTER YOU RETURN HOME: If you have visited a malaria risk area, continue taking your anti-malaria drug for 4 weeks (chloroquine, doxycycline, or mefloquine) or 7 days (atovaquone/proguanil) after leaving the risk area. Malaria is always a serious disease and may be a deadly illness. If you become ill with a fever or flu-like illness, while traveling or after you return home (for up to 1 year), you should seek immediate medical attention. Tell the caregiver your travel history. This information is courtesy of the Center for Disease Control (CDC).  Document Released: 11/04/2004 Document Revised: 02/15/2012 Document Reviewed: 09/23/2009 [facility] Patient Information 2013 [place], [place]. Concussion and Brain Injury A blow or jolt to the head can disrupt the normal function of the brain. This type of brain injury is often called a "concussion" or a "closed head injury." Concussions are usually not life-threatening. Even so, the effects of a concussion can be serious.  CAUSES  A concussion is caused by a blunt blow to the head. The blow might be  direct or indirect as described below.  Direct blow (running into another player during a soccer game, being hit in a fight, or hitting your head on a hard surface).  Indirect blow (when your head moves rapidly and violently back and forth like in a car crash). SYMPTOMS  The brain is very complex. Every head injury is different. Some symptoms may appear right away. Other symptoms may not show up  for days or weeks after the concussion. The signs of concussion can be hard to notice. Early on, problems may be missed by patients, family members, and caregivers. You may look fine even though you are acting or feeling differently.  These symptoms are usually temporary, but may last for days, weeks, or even longer. Symptoms include:  Mild headaches that will not go away.  Having more trouble than usual with:  Remembering things.  Paying attention or concentrating.  Organizing daily tasks.  Making decisions and solving problems.  Slowness in thinking, acting, speaking, or reading.  Getting lost or easily confused.  Feeling tired all the time or lacking energy (fatigue).  Feeling drowsy.  Sleep disturbances.  Sleeping more than usual.  Sleeping less than usual.  Trouble falling asleep.  Trouble sleeping (insomnia).  Loss of balance or feeling lightheaded or dizzy.  Nausea or vomiting.  Numbness or tingling.  Increased sensitivity to:  Sounds.  Lights.  Distractions. Other symptoms might include:  Vision problems or eyes that tire easily.  Diminished sense of taste or smell.  Ringing in the ears.  Mood changes such as feeling sad, anxious, or listless.  Becoming easily irritated or angry for little or no reason.  Lack of motivation. DIAGNOSIS  Your caregiver can usually diagnose a concussion or mild brain injury based on your description of your injury and your symptoms.  Your evaluation might include:  A brain scan to look for signs of injury to the brain. Even if the test shows no injury, you may still have a concussion.  Blood tests to be sure other problems are not present. TREATMENT   People with a concussion need to be examined and evaluated. Most people with concussions are treated in an emergency department, urgent care, or clinic. Some people must stay in the hospital overnight for further treatment.  Your caregiver will send you home with  important instructions to follow. Be sure to carefully follow them.  Tell your caregiver if you are already taking any medicines (prescription, over-the-counter, or natural remedies), or if you are drinking alcohol or taking illegal drugs. Also, talk with your caregiver if you are taking blood thinners (anticoagulants) or aspirin. These drugs may increase your chances of complications. All of this is important information that may affect treatment.  Only take over-the-counter or prescription medicines for pain, discomfort, or fever as directed by your caregiver. PROGNOSIS  How fast people recover from brain injury varies from person to person. Although most people have a good recovery, how quickly they improve depends on many factors. These factors include how severe their concussion was, what part of the brain was injured, their age, and how healthy they were before the concussion.  Because all head injuries are different, so is recovery. Most people with mild injuries recover fully. Recovery can take time. In general, recovery is slower in older persons. Also, persons who have had a concussion in the past or have other medical problems may find that it takes longer to recover from their current injury. Anxiety and depression may also make  it harder to adjust to the symptoms of brain injury. HOME CARE INSTRUCTIONS  Return to your normal activities slowly, not all at once. You must give your body and brain enough time for recovery.  Get plenty of sleep at night, and rest during the day. Rest helps the brain to heal.  Avoid staying up late at night.  Keep the same bedtime hours on weekends and weekdays.  Take daytime naps or rest breaks when you feel tired.  Limit activities that require a lot of thought or concentration (brain or cognitive rest). This includes:  Homework or job-related work.  Watching TV.  Computer work.  Avoid activities that could lead to a second brain injury, such as  contact or recreational sports, until your caregiver says it is okay. Even after your brain injury has healed, you should protect yourself from having another concussion.  Ask your caregiver when you can return to your normal activities such as driving, bicycling, or operating heavy equipment. Your ability to react may be slower after a brain injury.  Talk with your caregiver about when you can return to work or school.  Inform your teachers, school nurse, school counselor, coach, Event organiser, or work Production designer, theatre/television/film about your injury, symptoms, and restrictions. They should be instructed to report:  Increased problems with attention or concentration.  Increased problems remembering or learning new information.  Increased time needed to complete tasks or assignments.  Increased irritability or decreased ability to cope with stress.  Increased symptoms.  Take only those medicines that your caregiver has approved.  Do not drink alcohol until your caregiver says you are well enough to do so. Alcohol and certain other drugs may slow your recovery and can put you at risk of further injury.  If it is harder than usual to remember things, write them down.  If you are easily distracted, try to do one thing at a time. For example, do not try to watch TV while fixing dinner.  Talk with family members or close friends when making important decisions.  Keep all follow-up appointments. Repeated evaluation of your symptoms is recommended for your recovery. PREVENTION  Protect your head from future injury. It is very important to avoid another head or brain injury before you have recovered. In rare cases, another injury has lead to permanent brain damage, brain swelling, or death. Avoid injuries by using:  Seatbelts when riding in a car.  Alcohol only in moderation.  A helmet when biking, skiing, skateboarding, skating, or doing similar activities.  Safety measures in your home.  Remove clutter  and tripping hazards from floors and stairways.  Use grab bars in bathrooms and handrails by stairs.  Place non-slip mats on floors and in bathtubs.  Improve lighting in dim areas. SEEK MEDICAL CARE IF:  A head injury can cause lingering symptoms. You should seek medical care if you have any of the following symptoms for more than 3 weeks after your injury or are planning to return to sports:  Chronic headaches.  Dizziness or balance problems.  Nausea.  Vision problems.  Increased sensitivity to noise or light.  Depression or mood swings.  Anxiety or irritability.  Memory problems.  Difficulty concentrating or paying attention.  Sleep problems.  Feeling tired all the time. SEEK IMMEDIATE MEDICAL CARE IF:  You have had a blow or jolt to the head and you (or your family or friends) notice:  Severe or worsening headaches.  Weakness (even if only in one hand or one  leg or one part of the face), numbness, or decreased coordination.  Repeated vomiting.  Increased sleepiness or passing out.  One black center of the eye (pupil) is larger than the other.  Convulsions (seizures).  Slurred speech.  Increasing confusion, restlessness, agitation, or irritability.  Lack of ability to recognize people or places.  Neck pain.  Difficulty being awakened.  Unusual behavior changes.  Loss of consciousness. Older adults with a brain injury may have a higher risk of serious complications such as a blood clot on the brain. Headaches that get worse or an increase in confusion are signs of this complication. If these signs occur, see a caregiver right away. MAKE SURE YOU:   Understand these instructions.  Will watch your condition.  Will get help right away if you are not doing well or get worse. FOR MORE INFORMATION  Several groups help people with brain injury and their families. They provide information and put people in touch with local resources. These include support  groups, rehabilitation services, and a variety of health care professionals. Among these groups, the Brain Injury Association (BIA, www.biausa.org) has a Secretary/administrator that gathers scientific and educational information and works on a national level to help people with brain injury.  Document Released: 02/13/2004 Document Revised: 02/15/2012 Document Reviewed: 07/11/2008 [facility] Patient Information 2013 [place], [place].

## 2012-12-31 ENCOUNTER — Other Ambulatory Visit: Payer: Self-pay | Admitting: Family Medicine

## 2012-12-31 DIAGNOSIS — Z0000 Encounter for general adult medical examination without abnormal findings: Secondary | ICD-10-CM

## 2013-01-10 ENCOUNTER — Other Ambulatory Visit (INDEPENDENT_AMBULATORY_CARE_PROVIDER_SITE_OTHER): Payer: PRIVATE HEALTH INSURANCE

## 2013-01-10 DIAGNOSIS — Z0000 Encounter for general adult medical examination without abnormal findings: Secondary | ICD-10-CM

## 2013-01-10 LAB — BASIC METABOLIC PANEL
BUN: 20 mg/dL (ref 6–23)
CO2: 28 mEq/L (ref 19–32)
Calcium: 9.6 mg/dL (ref 8.4–10.5)
Chloride: 104 mEq/L (ref 96–112)
Creatinine, Ser: 1.3 mg/dL (ref 0.4–1.5)
GFR: 86.76 mL/min (ref >60.00)
Glucose, Bld: 91 mg/dL (ref 70–99)
Potassium: 3.9 mEq/L (ref 3.5–5.1)
Sodium: 138 mEq/L (ref 135–145)

## 2013-01-10 LAB — LIPID PANEL
Cholesterol: 167 mg/dL (ref 0–200)
HDL: 55.9 mg/dL (ref >39.00)
LDL Cholesterol: 101 mg/dL — ABNORMAL HIGH (ref 0–99)
Total CHOL/HDL Ratio: 3
Triglycerides: 51 mg/dL (ref 0.0–149.0)
VLDL: 10.2 mg/dL (ref 0.0–40.0)

## 2013-01-16 ENCOUNTER — Encounter: Payer: Self-pay | Admitting: Family Medicine

## 2013-01-16 ENCOUNTER — Ambulatory Visit (INDEPENDENT_AMBULATORY_CARE_PROVIDER_SITE_OTHER): Payer: 59 | Admitting: Family Medicine

## 2013-01-16 VITALS — BP 110/68 | HR 56 | Temp 98.2°F | Ht 70.0 in | Wt 183.0 lb

## 2013-01-16 DIAGNOSIS — Z0000 Encounter for general adult medical examination without abnormal findings: Secondary | ICD-10-CM

## 2013-01-16 DIAGNOSIS — J069 Acute upper respiratory infection, unspecified: Secondary | ICD-10-CM

## 2013-01-16 DIAGNOSIS — B081 Molluscum contagiosum: Secondary | ICD-10-CM

## 2013-01-16 NOTE — Assessment & Plan Note (Signed)
Supportive care as per instructions 

## 2013-01-16 NOTE — Assessment & Plan Note (Signed)
Discussed anticipate dx and course of illness

## 2013-01-16 NOTE — Progress Notes (Signed)
Subjective:    Patient ID: Kenneth Terrell, male    DOB: 1991/09/27, 22 y.o.   MRN: [id number]  HPI CC: annual exam  Has 3 cavities.  Sexual activity - monogamous for last 7 months.  No concerns with STDs.  H/o STD - gonorrhea age 77 yo treated.  GF on birth control.  Currently not exercising safe sex.  Seat belt use discussed.  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  Medications and allergies reviewed and updated in chart.  Past histories reviewed and updated if relevant as below. Patient Active Problem List  Diagnosis  . BIPOLAR DISORDER UNSPECIFIED  . ADHD  . Chin laceration  . Lower back pain  . Bronchitis  . Gynecomastia   No past medical history on file. Past Surgical History  Procedure Laterality Date  . [facility] admission  1999    x 1 week  . Keloid excision  10/2007    left ear (Dr. Shon Hough)  . External ear surgery     History  Substance Use Topics  . Smoking status: Never Smoker   . Smokeless tobacco: Never Used     Comment: quit 2011  . Alcohol Use: Yes     Comment: occassionally   Family History  Problem Relation Age of Onset  . Drug abuse Father   . Diabetes Maternal Uncle   . Cancer Maternal Grandmother     lung cancer met. to brain  . Depression Maternal Grandmother   . Stroke Maternal Grandmother   . Heart disease Other     MI; angina  . Cancer Other     colon   No Known Allergies Current Outpatient Prescriptions on File Prior to Visit  Medication Sig Dispense Refill  . cyclobenzaprine (FLEXERIL) 10 MG tablet Take 1 tablet (10 mg total) by mouth 2 (two) times daily as needed for muscle spasms.  10 tablet  0  . ipratropium (ATROVENT)  0.03 % nasal spray Place 2 sprays into the nose every 12 (twelve) hours.  30 mL  12  . loratadine (CLARITIN) 10 MG tablet Take 10 mg by mouth daily.       No current facility-administered medications on file prior to visit.     Review of Systems  Constitutional: Negative for fever, chills, activity change, appetite change, fatigue and unexpected weight change.  HENT: Positive for sore throat. Negative for hearing loss and neck pain.   Eyes: Negative for visual disturbance.  Respiratory: Positive for cough. Negative for chest tightness, shortness of breath and wheezing.   Cardiovascular: Negative for chest pain, palpitations and leg swelling.  Gastrointestinal: Negative for nausea, vomiting, abdominal pain, diarrhea, constipation, blood in stool and abdominal distention.  Genitourinary: Negative for hematuria and difficulty urinating.  Musculoskeletal: Negative for myalgias and arthralgias.  Skin: Negative for rash.  Neurological: Negative for dizziness, seizures, syncope and headaches.  Hematological: Does not bruise/bleed easily.  Psychiatric/Behavioral: Negative for dysphoric mood. The patient is not nervous/anxious.        Objective:   Physical Exam  Nursing note and vitals reviewed. Constitutional: He is oriented to person, place, and time. He appears well-developed and well-nourished. No distress.  HENT:  Head: Normocephalic and atraumatic.  Right  Ear: Hearing, tympanic membrane, external ear and ear canal normal.  Left Ear: Hearing, tympanic membrane, external ear and ear canal normal.  Nose: Nose normal.  Mouth/Throat: Oropharynx is clear and moist. No oropharyngeal exudate.  Eyes: Conjunctivae and EOM are normal. Pupils are equal, round, and reactive to light. No scleral icterus.  Neck: Normal range of motion. Neck supple.  Cardiovascular: Normal rate, regular rhythm, normal heart sounds and intact distal pulses.   No murmur heard. Pulses:      Radial pulses are 2+ on the  right side, and 2+ on the left side.  Pulmonary/Chest: Effort normal and breath sounds normal. No respiratory distress. He has no wheezes. He has no rales.  Abdominal: Soft. Bowel sounds are normal. He exhibits no distension and no mass. There is no tenderness. There is no rebound and no guarding.  Genitourinary: Penis normal.    Circumcised.  Umbilicated papules R inner thigh, nontender or pruritic.  Not verrucous  Musculoskeletal: Normal range of motion. He exhibits no edema.  Lymphadenopathy:    He has no cervical adenopathy.  Neurological: He is alert and oriented to person, place, and time.  CN grossly intact, station and gait intact  Skin: Skin is warm and dry. No rash noted.  Psychiatric: He has a normal mood and affect. His behavior is normal. Judgment and thought content normal.       Assessment & Plan:

## 2013-01-16 NOTE — Patient Instructions (Signed)
Good to see you today, call us with questions. Return as needed or in 2 years for next physical. Check on latest tetanus shot. You have a viral upper respiratory infection. Viral infections usually take 7-10 days to resolve.  The cough can last several weeks to go away. Push fluids and plenty of rest. May use ibuprofen, simple mucinex and robitussin or delsym for cough Please return if you are not improving as expected, or if you have high fevers (>101.5) or difficulty swallowing or worsening productive cough. Call clinic with questions.  Good to see you today.  Molluscum Contagiosum Molluscum contagiosum is a viral infection of the skin that causes smooth surfaced, firm, small (3 to 5 mm), dome-shaped bumps (papules) which are flesh-colored. The bumps usually do not hurt or itch. In children, they most often appear on the face, trunk, arms and legs. In adults, the growths are commonly found on the genitals, thighs, face, neck, and belly (abdomen). The infection may be spread to others by close (skin to skin) contact (such as occurs in schools and swimming pools), sharing towels and clothing, and through sexual contact. The bumps usually disappear without treatment in 2 to 4 months, especially in children. You may have them treated to avoid spreading them. Scraping (curetting) the middle part (central plug) of the bump with a needle or sharp curette, or application of liquid nitrogen for 8 or 9 seconds usually cures the infection. HOME CARE INSTRUCTIONS   Do not scratch the bumps. This may spread the infection to other parts of the body and to other people.  Avoid close contact with others, including sexual contact, until the bumps disappear. Do not share towels or clothing.  If liquid nitrogen was used, blisters will form. Leave the blisters alone and cover with a bandage. The tops will fall off by themselves in 7 to 14 days.  Four months without a lesion is usually a cure. SEEK IMMEDIATE  MEDICAL CARE IF:  You have a fever.  You develop swelling, redness, pain, tenderness, or warmth in the areas of the bumps. They may be infected. Document Released: 11/20/2000 Document Revised: 02/15/2012 Document Reviewed: 05/03/2009 [facility] Patient Information 2013 [place], [place].

## 2013-01-16 NOTE — Assessment & Plan Note (Signed)
Preventative protocols reviewed and updated unless pt declined. Discussed healthy diet and lifestyle.  

## 2013-01-21 ENCOUNTER — Other Ambulatory Visit: Payer: Self-pay

## 2013-10-12 ENCOUNTER — Other Ambulatory Visit: Payer: Self-pay

## 2013-12-26 ENCOUNTER — Encounter: Payer: Self-pay | Admitting: Family Medicine

## 2013-12-26 ENCOUNTER — Ambulatory Visit (INDEPENDENT_AMBULATORY_CARE_PROVIDER_SITE_OTHER): Payer: BC Managed Care – PPO | Admitting: Family Medicine

## 2013-12-26 VITALS — BP 100/60 | HR 56 | Temp 97.5°F | Wt 191.5 lb

## 2013-12-26 DIAGNOSIS — A630 Anogenital (venereal) warts: Secondary | ICD-10-CM

## 2013-12-26 DIAGNOSIS — R300 Dysuria: Secondary | ICD-10-CM | POA: Insufficient documentation

## 2013-12-26 DIAGNOSIS — Z113 Encounter for screening for infections with a predominantly sexual mode of transmission: Secondary | ICD-10-CM

## 2013-12-26 LAB — POCT URINALYSIS DIPSTICK
Bilirubin, UA: NEGATIVE
Blood, UA: NEGATIVE
Glucose, UA: NEGATIVE
Ketones, UA: NEGATIVE
Nitrite, UA: NEGATIVE
Spec Grav, UA: 1.015
Urobilinogen, UA: 0.2
pH, UA: 6

## 2013-12-26 MED ORDER — AZITHROMYCIN 1 G PO PACK: 1.0000 g | PACK | Freq: Once | ORAL | Status: DC

## 2013-12-26 MED ORDER — CEFTRIAXONE SODIUM 250 MG IJ SOLR
250.0000 mg | Freq: Once | INTRAMUSCULAR | Status: AC
  Administered 2013-12-26: 250 mg via INTRAMUSCULAR

## 2013-12-26 NOTE — Progress Notes (Signed)
Pre-visit discussion using our clinic review tool. No additional management support is needed unless otherwise documented below in the visit note.  

## 2013-12-26 NOTE — Patient Instructions (Addendum)
Rocephin shot today as well as sent home with azithromycin slurry. Notify your partners to be tested. Testing done today as well - we will call you with results.

## 2013-12-26 NOTE — Addendum Note (Signed)
Addended by: Sydell AxonLAWS, Elira Colasanti C on: 12/26/2013 11:06 AM   Modules accepted: Orders

## 2013-12-26 NOTE — Assessment & Plan Note (Signed)
Discussed.

## 2013-12-26 NOTE — Addendum Note (Signed)
Addended by: Eustaquio BoydenGUTIERREZ, Chakara Bognar on: 12/26/2013 01:58 PM   Modules accepted: Orders

## 2013-12-26 NOTE — Addendum Note (Signed)
Addended by: Sydell AxonLAWS, Shigeo Baugh C on: 12/26/2013 10:50 AM   Modules accepted: Orders

## 2013-12-26 NOTE — Progress Notes (Addendum)
   Subjective:    Patient ID: Kenneth Terrell, male    DOB: 07/26/1991, 23 y.o.   MRN: [id number]  HPI CC: burning with urination  1.5 wk h/o dysuria and some itching on ventral penis.  No fevers/chills, abd pain, nausea, urethral discharge, swollen glands.  No URI sxs.  Hasn't tried anything for this other than increasing water.  H/o chlamydia 6 yrs ago, treated. Currently sexually active, 4 partners in the last year.  Uses condoms, but recently one broke.  Worried about STDs  Past Medical History  Diagnosis Date  . BIPOLAR DISORDER UNSPECIFIED 01/11/2008    Qualifier: Diagnosis of  By: Lorrene Reidombs LPN, Burna MortimerWanda    . ADHD 01/11/2008    Annotation: 3 YOA Qualifier: Diagnosis of  By: Lorrene Reidombs LPN, Wanda       Review of Systems Per HPI    Objective:   Physical Exam  Nursing note and vitals reviewed. Constitutional: He appears well-developed and well-nourished. No distress.  Genitourinary: Testes normal and penis normal. Right testis shows no mass, no swelling and no tenderness. Right testis is descended. Left testis shows no mass, no swelling and no tenderness. Left testis is descended. Circumcised. No phimosis, penile erythema or penile tenderness. No discharge found.  Small verucca on left penile shaft  Lymphadenopathy:       Right: No inguinal adenopathy present.       Left: No inguinal adenopathy present.       Assessment & Plan:

## 2013-12-26 NOTE — Assessment & Plan Note (Addendum)
High risk for STDs. suspicious for gonorrhea or chlamydia urethritis - treat with 250mg  IM CTX and 1gm Azithromycin PO. Check CT/GC urine, as well as UA/Cx if positive. Check HIV/RPR Discussed importance of monogamous relationship and protection.

## 2013-12-27 LAB — GC/CHLAMYDIA PROBE AMP, URINE
Chlamydia, Swab/Urine, PCR: NEGATIVE
GC Probe Amp, Urine: NEGATIVE

## 2013-12-27 LAB — RPR

## 2013-12-27 LAB — HIV ANTIBODY (ROUTINE TESTING W REFLEX): HIV: NONREACTIVE

## 2013-12-28 LAB — URINE CULTURE
Colony Count: NO GROWTH
Organism ID, Bacteria: NO GROWTH

## 2014-08-04 ENCOUNTER — Encounter: Payer: Self-pay | Admitting: Family Medicine

## 2014-08-04 ENCOUNTER — Ambulatory Visit (INDEPENDENT_AMBULATORY_CARE_PROVIDER_SITE_OTHER): Payer: BC Managed Care – PPO | Admitting: Family Medicine

## 2014-08-04 VITALS — BP 120/80 | HR 58 | Temp 97.6°F | Ht 70.0 in | Wt 192.0 lb

## 2014-08-04 DIAGNOSIS — J019 Acute sinusitis, unspecified: Secondary | ICD-10-CM

## 2014-08-04 MED ORDER — AZITHROMYCIN 250 MG PO TABS: ORAL_TABLET | ORAL | Status: DC

## 2014-08-04 NOTE — Progress Notes (Signed)
Pre visit review using our clinic review tool, if applicable. No additional management support is needed unless otherwise documented below in the visit note. 

## 2014-08-04 NOTE — Progress Notes (Signed)
   Subjective:    Patient ID: Kenneth Terrell, male    DOB: 1991-01-15, 23 y.o.   MRN: [id number]  HPI Here for 4 days of sinus pressure, PND, and coughing up yellow sputum. No fever.    Review of Systems  Constitutional: Negative.   HENT: Positive for congestion, postnasal drip and sinus pressure.   Eyes: Negative.   Respiratory: Positive for cough.        Objective:   Physical Exam  Constitutional: He appears well-developed and well-nourished.  HENT:  Right Ear: External ear normal.  Left Ear: External ear normal.  Nose: Nose normal.  Mouth/Throat: Oropharynx is clear and moist.  Eyes: Conjunctivae are normal.  Pulmonary/Chest: Effort normal and breath sounds normal.  Lymphadenopathy:    He has no cervical adenopathy.          Assessment & Plan:  Add Mucinex

## 2014-08-30 ENCOUNTER — Ambulatory Visit (INDEPENDENT_AMBULATORY_CARE_PROVIDER_SITE_OTHER): Payer: BC Managed Care – PPO | Admitting: Family Medicine

## 2014-08-30 ENCOUNTER — Encounter: Payer: Self-pay | Admitting: Family Medicine

## 2014-08-30 VITALS — BP 108/68 | HR 46 | Temp 98.3°F | Wt 191.8 lb

## 2014-08-30 DIAGNOSIS — S93409A Sprain of unspecified ligament of unspecified ankle, initial encounter: Secondary | ICD-10-CM

## 2014-08-30 DIAGNOSIS — S93402A Sprain of unspecified ligament of left ankle, initial encounter: Secondary | ICD-10-CM | POA: Insufficient documentation

## 2014-08-30 NOTE — Progress Notes (Signed)
   BP 108/68  Pulse 46  Temp(Src) 98.3 F (36.8 C) (Oral)  Wt 191 lb 12 oz (86.977 kg)  SpO2 98%   CC: L foot injury  Subjective:    Patient ID: Kenneth Terrell, male    DOB: 1991-04-25, 23 y.o.   MRN: [id number]  HPI: Kenneth Terrell is a 23 y.o. male presenting on 08/30/2014 for Foot Injury   DOI: 08/25/2014 Playing basketball jumped and landed with ankle inversion. Very swollen initially, slowly improving.  Has been soaking in ice and using hot water.  Has been taking ibuprofen at home.  Did use crutches for first few days but now able to walk regularly. No bruising.  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  Relevant past medical, surgical, family and social history reviewed and updated as indicated.  Allergies and medications reviewed and updated. Current Outpatient Prescriptions on File Prior to Visit  Medication Sig  . Multiple Vitamin (MULTIVITAMIN) tablet Take 1 tablet by mouth daily.   No current facility-administered medications on file prior to visit.    Review of Systems Per HPI unless specifically indicated above    Objective:    BP 108/68  Pulse 46  Temp(Src) 98.3 F (36.8 C) (Oral)  Wt 191 lb 12 oz (86.977 kg)  SpO2 98%  Physical Exam  Nursing note and vitals reviewed. Constitutional: He appears well-developed and well-nourished. No distress.  Musculoskeletal: He exhibits no edema.  2+ DP bilaterally. Tender to palpation at ATFL on left and diffusely at L lateral ankle ligament. No pain at navicular bone, base of 5th MT, or malleoli. Able to bear weight without trouble. No ligament laxity bilaterally.       Assessment & Plan:   Problem List Items Addressed This Visit   Left ankle sprain - Primary     No concerns for fracture today. Treat with ASO brace, continue ibuprofen, elevation of leg, and heating pad. Update if not improving as expected. Provided with exercises from [facility] pt advisor on ankle  sprain. Look into wobble board for balance exercises to improve proprioception and hopefully avoid future sprains.        Follow up plan: Return if symptoms worsen or fail to improve.

## 2014-08-30 NOTE — Progress Notes (Signed)
Pre visit review using our clinic review tool, if applicable. No additional management support is needed unless otherwise documented below in the visit note. 

## 2014-08-30 NOTE — Assessment & Plan Note (Signed)
No concerns for fracture today. Treat with ASO brace, continue ibuprofen, elevation of leg, and heating pad. Update if not improving as expected. Provided with exercises from [facility] pt advisor on ankle sprain. Look into wobble board for balance exercises to improve proprioception and hopefully avoid future sprains.

## 2014-08-30 NOTE — Patient Instructions (Addendum)
Left ankle sprain - treat with continued heating pad, elevation of leg, use ankle brace on left, and do exercises provided today. Look into balance exercise for left ankle to prevent future injury (wobble board).

## 2014-09-13 ENCOUNTER — Ambulatory Visit: Payer: BC Managed Care – PPO | Admitting: Family Medicine

## 2014-09-14 ENCOUNTER — Ambulatory Visit: Payer: BC Managed Care – PPO | Admitting: Family Medicine

## 2014-09-21 ENCOUNTER — Other Ambulatory Visit: Payer: Self-pay

## 2014-10-18 ENCOUNTER — Ambulatory Visit (INDEPENDENT_AMBULATORY_CARE_PROVIDER_SITE_OTHER): Payer: BC Managed Care – PPO | Admitting: Internal Medicine

## 2014-10-18 ENCOUNTER — Encounter: Payer: Self-pay | Admitting: Internal Medicine

## 2014-10-18 VITALS — BP 124/72 | HR 57 | Temp 98.4°F | Wt 190.0 lb

## 2014-10-18 DIAGNOSIS — M545 Low back pain, unspecified: Secondary | ICD-10-CM

## 2014-10-18 DIAGNOSIS — S161XXA Strain of muscle, fascia and tendon at neck level, initial encounter: Secondary | ICD-10-CM

## 2014-10-18 NOTE — Progress Notes (Signed)
Subjective:    Patient ID: Kenneth Terrell, male    DOB: 1991-04-21, 23 y.o.   MRN: [id number]  HPI  Pt presents to the clinic today with c/o pain in his neck. He reports this started about 3 days ago. He denies any specific injury to the area but reports he was doing plyometrics with a exercise ball on his neck. He does report muscle tightness and difficultylooking to the left. He also started noticing low back pain last night. He feels like his muscles are tight in that area as well. He describes the pain as achy and stiff. He denies numbness or tingling in the arms or legs. He denies loss of bowel or bladder. He did try getting in the hot tub without any relief but reports he has not tried anything OTC.  Review of Systems      Past Medical History  Diagnosis Date  . BIPOLAR DISORDER UNSPECIFIED 01/11/2008    Qualifier: Diagnosis of  By: Zara Council LPN, Mariann Laster    . ADHD 01/11/2008    Annotation: 3 YOA Qualifier: Diagnosis of  By: Zara Council LPN, Mariann Laster    . History of chlamydia 2008    treated    Current Outpatient Prescriptions  Medication Sig Dispense Refill  . Multiple Vitamin (MULTIVITAMIN) tablet Take 1 tablet by mouth daily.     No current facility-administered medications for this visit.    No Known Allergies  Family History  Problem Relation Age of Onset  . Drug abuse Father   . Diabetes Maternal Uncle   . Cancer Maternal Grandmother     lung cancer met. to brain  . Depression Maternal Grandmother   . Stroke Maternal Grandmother   . Heart disease Other     MI; angina  . Cancer Other     colon    History   Social History  . Marital Status: Single    Spouse Name: N/A    Number of Children: N/A  . Years of Education: N/A   Occupational History  . Student     Going to [employer]   Social History Main Topics  . Smoking status: Never Smoker   . Smokeless tobacco: Never Used     Comment: quit 2011  . Alcohol Use: No  . Drug Use: No  . Sexual Activity: Yes   Other  Topics Concern  . Not on file   Social History Narrative   Lives with mother   Dad is in [place]//does not see on a regular basis   Hobbies; BB, FB   Graduated from [employer], [place], wants to be certified personal trainer - to go to school in [place]   Activity: gym 6d/wk, T25   Diet: lots of sweets, good water, fruits/vegetables seldom     Constitutional: Denies fever, malaise, fatigue, headache or abrupt weight changes.  Musculoskeletal: Pt reports low back pain and neck pain. Denies difficulty with gait, or joint pain and swelling.    No other specific complaints in a complete review of systems (except as listed in HPI above).  Objective:   Physical Exam   BP 124/72 mmHg  Pulse 57  Temp(Src) 98.4 F (36.9 C) (Oral)  Wt 190 lb (86.183 kg)  SpO2 99% Wt Readings from Last 3 Encounters:  10/18/14 190 lb (86.183 kg)  08/30/14 191 lb 12 oz (86.977 kg)  08/04/14 192 lb (87.091 kg)    General: Appears his stated age, well developed, well nourished in NAD. Cardiovascular: Normal rate and rhythm.  S1,S2 noted.  No murmur, rubs or gallops noted.  Pulmonary/Chest: Normal effort and positive vesicular breath sounds. No respiratory distress. No wheezes, rales or ronchi noted.  Musculoskeletal: Normal flexion, extension of the cervical spine. Decreased lateral rotation and lateral flexion to the left. No pain with palpation of the cervical spine. Normal flexion, extension and lateral rotation of the lumbar spine. No pain with palpation of the lumbar spine. Pain with palpation of the paralumbar muscles noted. No difficulty with gait.    BMET    Component Value Date/Time   NA 138 01/10/2013 0855   K 3.9 01/10/2013 0855   CL 104 01/10/2013 0855   CO2 28 01/10/2013 0855   GLUCOSE 91 01/10/2013 0855   BUN 20 01/10/2013 0855   CREATININE 1.3 01/10/2013 0855   CALCIUM 9.6 01/10/2013 0855   GFRNONAA 110.76 07/24/2009 0926    Lipid Panel     Component Value Date/Time   CHOL 167  01/10/2013 0855   TRIG 51.0 01/10/2013 0855   HDL 55.90 01/10/2013 0855   CHOLHDL 3 01/10/2013 0855   VLDL 10.2 01/10/2013 0855   LDLCALC 101* 01/10/2013 0855    CBC    Component Value Date/Time   WBC 10.2 12/04/2009 0321   RBC 4.06* 12/04/2009 0321   HGB 12.9* 12/04/2009 0327   HCT 38.0* 12/04/2009 0327   PLT 220 12/04/2009 0321   MCV 93.2 12/04/2009 0321   MCHC 34.4 12/04/2009 0321   RDW 12.6 12/04/2009 0321   LYMPHSABS 0.9 12/04/2009 0321   MONOABS 0.6 12/04/2009 0321   EOSABS 0.0 12/04/2009 0321   BASOSABS 0.0 12/04/2009 0321    Hgb A1C No results found for: HGBA1C      Assessment & Plan:   Muscle strain of neck and back:  Stretching exercises given No medicine ball exercise for the next week Ibuprofen for inflammation A heating pad for comfort  Should resolve in 1-2 weeks  RTC as needed or if symptoms persist or worsen

## 2014-10-18 NOTE — Patient Instructions (Signed)
Back Exercises These exercises may help you when beginning to rehabilitate your injury. Your symptoms may resolve with or without further involvement from your physician, physical therapist or athletic trainer. While completing these exercises, remember:   Restoring tissue flexibility helps normal motion to return to the joints. This allows healthier, less painful movement and activity.  An effective stretch should be held for at least 30 seconds.  A stretch should never be painful. You should only feel a gentle lengthening or release in the stretched tissue. STRETCH - Extension, Prone on Elbows   Lie on your stomach on the floor, a bed will be too soft. Place your palms about shoulder width apart and at the height of your head.  Place your elbows under your shoulders. If this is too painful, stack pillows under your chest.  Allow your body to relax so that your hips drop lower and make contact more completely with the floor.  Hold this position for __________ seconds.  Slowly return to lying flat on the floor. Repeat __________ times. Complete this exercise __________ times per day.  RANGE OF MOTION - Extension, Prone Press Ups   Lie on your stomach on the floor, a bed will be too soft. Place your palms about shoulder width apart and at the height of your head.  Keeping your back as relaxed as possible, slowly straighten your elbows while keeping your hips on the floor. You may adjust the placement of your hands to maximize your comfort. As you gain motion, your hands will come more underneath your shoulders.  Hold this position __________ seconds.  Slowly return to lying flat on the floor. Repeat __________ times. Complete this exercise __________ times per day.  RANGE OF MOTION- Quadruped, Neutral Spine   Assume a hands and knees position on a firm surface. Keep your hands under your shoulders and your knees under your hips. You may place padding under your knees for  comfort.  Drop your head and point your tail bone toward the ground below you. This will round out your low back like an angry cat. Hold this position for __________ seconds.  Slowly lift your head and release your tail bone so that your back sags into a large arch, like an old horse.  Hold this position for __________ seconds.  Repeat this until you feel limber in your low back.  Now, find your "sweet spot." This will be the most comfortable position somewhere between the two previous positions. This is your neutral spine. Once you have found this position, tense your stomach muscles to support your low back.  Hold this position for __________ seconds. Repeat __________ times. Complete this exercise __________ times per day.  STRETCH - Flexion, Single Knee to Chest   Lie on a firm bed or floor with both legs extended in front of you.  Keeping one leg in contact with the floor, bring your opposite knee to your chest. Hold your leg in place by either grabbing behind your thigh or at your knee.  Pull until you feel a gentle stretch in your low back. Hold __________ seconds.  Slowly release your grasp and repeat the exercise with the opposite side. Repeat __________ times. Complete this exercise __________ times per day.  STRETCH - Hamstrings, Standing  Stand or sit and extend your right / left leg, placing your foot on a chair or foot stool  Keeping a slight arch in your low back and your hips straight forward.  Lead with your chest and   lean forward at the waist until you feel a gentle stretch in the back of your right / left knee or thigh. (When done correctly, this exercise requires leaning only a small distance.)  Hold this position for __________ seconds. Repeat __________ times. Complete this stretch __________ times per day. STRENGTHENING - Deep Abdominals, Pelvic Tilt   Lie on a firm bed or floor. Keeping your legs in front of you, bend your knees so they are both pointed  toward the ceiling and your feet are flat on the floor.  Tense your lower abdominal muscles to press your low back into the floor. This motion will rotate your pelvis so that your tail bone is scooping upwards rather than pointing at your feet or into the floor.  With a gentle tension and even breathing, hold this position for __________ seconds. Repeat __________ times. Complete this exercise __________ times per day.  STRENGTHENING - Abdominals, Crunches   Lie on a firm bed or floor. Keeping your legs in front of you, bend your knees so they are both pointed toward the ceiling and your feet are flat on the floor. Cross your arms over your chest.  Slightly tip your chin down without bending your neck.  Tense your abdominals and slowly lift your trunk high enough to just clear your shoulder blades. Lifting higher can put excessive stress on the low back and does not further strengthen your abdominal muscles.  Control your return to the starting position. Repeat __________ times. Complete this exercise __________ times per day.  STRENGTHENING - Quadruped, Opposite UE/LE Lift   Assume a hands and knees position on a firm surface. Keep your hands under your shoulders and your knees under your hips. You may place padding under your knees for comfort.  Find your neutral spine and gently tense your abdominal muscles so that you can maintain this position. Your shoulders and hips should form a rectangle that is parallel with the floor and is not twisted.  Keeping your trunk steady, lift your right hand no higher than your shoulder and then your left leg no higher than your hip. Make sure you are not holding your breath. Hold this position __________ seconds.  Continuing to keep your abdominal muscles tense and your back steady, slowly return to your starting position. Repeat with the opposite arm and leg. Repeat __________ times. Complete this exercise __________ times per day. Document Released:  12/11/2005 Document Revised: 02/15/2012 Document Reviewed: 03/07/2009 ExitCare Patient Information 2015 [employer]. This information is not intended to replace advice given to you by your health care provider. Make sure you discuss any questions you have with your health care provider.  

## 2014-10-18 NOTE — Progress Notes (Signed)
Pre visit review using our clinic review tool, if applicable. No additional management support is needed unless otherwise documented below in the visit note. 

## 2015-02-04 ENCOUNTER — Other Ambulatory Visit (INDEPENDENT_AMBULATORY_CARE_PROVIDER_SITE_OTHER): Payer: 59

## 2015-02-04 ENCOUNTER — Other Ambulatory Visit: Payer: Self-pay | Admitting: Family Medicine

## 2015-02-04 DIAGNOSIS — Z0000 Encounter for general adult medical examination without abnormal findings: Secondary | ICD-10-CM

## 2015-02-04 LAB — COMPREHENSIVE METABOLIC PANEL
ALT: 30 U/L (ref 0–53)
AST: 32 U/L (ref 0–37)
Albumin: 4.3 g/dL (ref 3.5–5.2)
Alkaline Phosphatase: 94 U/L (ref 39–117)
BUN: 13 mg/dL (ref 6–23)
CO2: 31 mEq/L (ref 19–32)
Calcium: 9.7 mg/dL (ref 8.4–10.5)
Chloride: 103 mEq/L (ref 96–112)
Creatinine, Ser: 1.31 mg/dL (ref 0.40–1.50)
GFR: 85.98 mL/min (ref >60.00)
Glucose, Bld: 95 mg/dL (ref 70–99)
Potassium: 4.4 mEq/L (ref 3.5–5.1)
Sodium: 138 mEq/L (ref 135–145)
Total Bilirubin: 0.7 mg/dL (ref 0.2–1.2)
Total Protein: 7 g/dL (ref 6.0–8.3)

## 2015-02-06 ENCOUNTER — Ambulatory Visit (INDEPENDENT_AMBULATORY_CARE_PROVIDER_SITE_OTHER): Payer: 59 | Admitting: Family Medicine

## 2015-02-06 ENCOUNTER — Encounter: Payer: Self-pay | Admitting: Family Medicine

## 2015-02-06 VITALS — BP 112/80 | HR 52 | Temp 98.2°F | Ht 70.0 in | Wt 201.5 lb

## 2015-02-06 DIAGNOSIS — Z0000 Encounter for general adult medical examination without abnormal findings: Secondary | ICD-10-CM

## 2015-02-06 DIAGNOSIS — K625 Hemorrhage of anus and rectum: Secondary | ICD-10-CM | POA: Insufficient documentation

## 2015-02-06 DIAGNOSIS — S8991XA Unspecified injury of right lower leg, initial encounter: Secondary | ICD-10-CM | POA: Insufficient documentation

## 2015-02-06 DIAGNOSIS — Z113 Encounter for screening for infections with a predominantly sexual mode of transmission: Secondary | ICD-10-CM

## 2015-02-06 MED ORDER — OMEPRAZOLE 40 MG PO CPDR: 40.0000 mg | DELAYED_RELEASE_CAPSULE | Freq: Every day | ORAL | Status: DC

## 2015-02-06 NOTE — Assessment & Plan Note (Signed)
No fissure or hemorrhoids noted today. iFOB today. Pt will monitor for improvement. If persistent bleed to update me. Discussed avoiding spicy foods, increasing water and fiber in diet for 1 regular soft stool per day Pt agrees with plan.

## 2015-02-06 NOTE — Assessment & Plan Note (Addendum)
Preventative protocols reviewed and updated unless pt declined. Discussed healthy diet and lifestyle.  STD screen today. 

## 2015-02-06 NOTE — Progress Notes (Signed)
BP 112/80 mmHg  Pulse 52  Temp(Src) 98.2 F (36.8 C) (Oral)  Ht  (1.778 m)  Wt 201 lb 8 oz (91.4 kg)  BMI 28.91 kg/m2   CC: CPE  Subjective:    Patient ID: Kenneth Terrell, male    DOB: 04-17-1991, 24 y.o.   MRN: [id number]  HPI: Kenneth Terrell is a 24 y.o. male presenting on 02/06/2015 for Annual Exam   ?ADHD and bipolar - actually had eval by psychiatrist 2-3 yrs ago, thought misdiagnosis and more consistnet with depression. Currently no sxs or concerns.  Not currently sexually active. 4 partners in past year. Requests STD screen.   Notices worsening GERD sxs. Denies EtOH, NSAIDs. Recent blood in stool over last week - with wiping and in stool. H/o anal fissure in past. Possible hemorrhoids in the past. No pain with stooling now.   On Sunday R knee injury playing football. Inversion injury to ankle and knee. Since then having some knee discomfort which is slowly improving.  Preventative: H/o STD - gonorrhea age 74 yo treated.  Seat belt use discussed. No suspicious moles on skin. 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 Interim medical history since our last visit reviewed. Allergies and medications reviewed and updated. No current outpatient prescriptions on file prior to visit.   No current facility-administered medications on file prior to visit.    Review of Systems  Constitutional: Negative for fever, chills, activity change, appetite change, fatigue and unexpected weight change.  HENT: Negative for hearing loss.   Eyes: Negative for visual disturbance.  Respiratory: Negative  for cough, chest tightness, shortness of breath and wheezing.   Cardiovascular: Negative for chest pain, palpitations and leg swelling.  Gastrointestinal: Positive for blood in stool. Negative for nausea, vomiting, abdominal pain, diarrhea, constipation and abdominal distention.  Genitourinary: Negative for hematuria and difficulty urinating.  Musculoskeletal: Negative for myalgias, arthralgias and neck pain.  Skin: Negative for rash.  Neurological: Negative for dizziness, seizures, syncope and headaches.  Hematological: Negative for adenopathy. Does not bruise/bleed easily.  Psychiatric/Behavioral: Negative for dysphoric mood. The patient is not nervous/anxious.    Per HPI unless specifically indicated above     Objective:    BP 112/80 mmHg  Pulse 52  Temp(Src) 98.2 F (36.8 C) (Oral)  Ht  (1.778 m)  Wt 201 lb 8 oz (91.4 kg)  BMI 28.91 kg/m2  Wt Readings from Last 3 Encounters:  02/06/15 201 lb 8 oz (91.4 kg)  10/18/14 190 lb (86.183 kg)  08/30/14 191 lb 12 oz (86.977 kg)    Physical Exam  Constitutional: He is oriented to person, place, and time. He appears well-developed and well-nourished. No distress.  HENT:  Head: Normocephalic and atraumatic.  Right Ear: Hearing, tympanic membrane, external ear and ear canal normal.  Left Ear: Hearing, tympanic membrane, external ear and ear canal normal.  Nose: Nose normal.  Mouth/Throat: Uvula is midline, oropharynx is clear and moist and mucous membranes are normal. No oropharyngeal exudate, posterior oropharyngeal edema or posterior oropharyngeal erythema.  Eyes: Conjunctivae and EOM are normal. Pupils are equal, round, and reactive to  light. No scleral icterus.  Neck: Normal range of motion. Neck supple. No thyromegaly present.  Cardiovascular: Normal rate, regular rhythm, normal heart sounds and intact distal pulses.   No murmur heard. Pulses:      Radial pulses are 2+ on the right side, and 2+ on the left side.    Pulmonary/Chest: Effort normal and breath sounds normal. No respiratory distress. He has no wheezes. He has no rales.  Abdominal: Soft. Bowel sounds are normal. He exhibits no distension and no mass. There is no tenderness. There is no rebound and no guarding. Hernia confirmed negative in the right inguinal area and confirmed negative in the left inguinal area.  Genitourinary: Testes normal and penis normal. Right testis shows no mass, no swelling and no tenderness. Right testis is descended. Left testis shows no mass, no swelling and no tenderness. Left testis is descended. Circumcised.  Few genital warts L shaft  Musculoskeletal: Normal range of motion. He exhibits no edema.  R knee without ligament laxity, FROM without crepitus  Lymphadenopathy:    He has no cervical adenopathy.       Right: No inguinal adenopathy present.       Left: No inguinal adenopathy present.  Neurological: He is alert and oriented to person, place, and time.  CN grossly intact, station and gait intact  Skin: Skin is warm and dry. No rash noted.  Psychiatric: He has a normal mood and affect. His behavior is normal. Judgment and thought content normal.  Nursing note and vitals reviewed.  Results for orders placed or performed in visit on 02/04/15  Comprehensive metabolic panel  Result Value Ref Range   Sodium 138 135 - 145 mEq/L   Potassium 4.4 3.5 - 5.1 mEq/L   Chloride 103 96 - 112 mEq/L   CO2 31 19 - 32 mEq/L   Glucose, Bld 95 70 - 99 mg/dL   BUN 13 6 - 23 mg/dL   Creatinine, Ser 4.091.31 0.40 - 1.50 mg/dL   Total Bilirubin 0.7 0.2 - 1.2 mg/dL   Alkaline Phosphatase 94 39 - 117 U/L   AST 32 0 - 37 U/L   ALT 30 0 - 53 U/L   Total Protein 7.0 6.0 - 8.3 g/dL   Albumin 4.3 3.5 - 5.2 g/dL   Calcium 9.7 8.4 - 81.110.5 mg/dL   GFR 91.4785.98 >82.95>60.00 mL/min      Assessment & Plan:   Problem List Items Addressed This Visit    Right knee injury    Anticipate R knee strain - supportive care discussed. Should heal well.       Healthcare maintenance - Primary    Preventative protocols reviewed and updated unless pt declined. Discussed healthy diet and lifestyle.  STD screen today.      BRBPR (bright red blood per rectum)    No fissure or hemorrhoids noted today. iFOB today. Pt will monitor for improvement. If persistent bleed to update me. Discussed avoiding spicy foods, increasing water and fiber in diet for 1 regular soft stool per day Pt agrees with plan.      Relevant Orders   Fecal occult blood, imunochemical    Other Visit Diagnoses    Screen for STD (sexually transmitted disease)        Relevant Orders    HIV antibody    RPR    GC/chlamydia probe amp, genital        Follow up plan: Return in about 1 year (around 02/06/2016), or as needed, for annual  exam, prior fasting for blood work.

## 2015-02-06 NOTE — Patient Instructions (Addendum)
Pass by lab to pick up stool kit and for blood work. Try omeprazole 3m daily for 3wk then as needed for heartburn. Afterwards you can try over the counter pepcid or zantac. I think you have knee strain. Should heal well. May use tylenol.  For bleed - do stool kit. Increase fiber and water in diet for regular stool habits. Monitor over next 1-2 weeks. If persistent blood let me know for further evaluation. Return as needed or in 1 year for next physical

## 2015-02-06 NOTE — Addendum Note (Signed)
Addended by: Baldomero LamyHAVERS, Christna Kulick C on: 02/06/2015 03:44 PM   Modules accepted: Kipp BroodSmartSet

## 2015-02-06 NOTE — Progress Notes (Signed)
Pre visit review using our clinic review tool, if applicable. No additional management support is needed unless otherwise documented below in the visit note. 

## 2015-02-06 NOTE — Assessment & Plan Note (Signed)
Anticipate R knee strain - supportive care discussed. Should heal well.

## 2015-02-07 LAB — HIV ANTIBODY (ROUTINE TESTING W REFLEX): HIV 1&2 Ab, 4th Generation: NONREACTIVE

## 2015-02-07 LAB — GC/CHLAMYDIA PROBE AMP
CT Probe RNA: NEGATIVE
GC Probe RNA: NEGATIVE

## 2015-02-07 LAB — RPR

## 2015-02-13 ENCOUNTER — Ambulatory Visit: Payer: PRIVATE HEALTH INSURANCE

## 2015-02-14 ENCOUNTER — Encounter: Payer: Self-pay | Admitting: Family Medicine

## 2015-02-14 ENCOUNTER — Ambulatory Visit (INDEPENDENT_AMBULATORY_CARE_PROVIDER_SITE_OTHER): Payer: 59 | Admitting: Family Medicine

## 2015-02-14 VITALS — BP 110/76 | HR 49 | Temp 98.3°F | Ht 70.0 in | Wt 202.5 lb

## 2015-02-14 DIAGNOSIS — Z23 Encounter for immunization: Secondary | ICD-10-CM

## 2015-02-14 DIAGNOSIS — S63641A Sprain of metacarpophalangeal joint of right thumb, initial encounter: Secondary | ICD-10-CM | POA: Insufficient documentation

## 2015-02-14 DIAGNOSIS — S51811A Laceration without foreign body of right forearm, initial encounter: Secondary | ICD-10-CM

## 2015-02-14 DIAGNOSIS — S6991XA Unspecified injury of right wrist, hand and finger(s), initial encounter: Secondary | ICD-10-CM

## 2015-02-14 NOTE — Progress Notes (Signed)
   Subjective:    Patient ID: Kenneth Terrell, male    DOB: 07/20/1991, 24 y.o.   MRN: [id number]  HPI 24 year old male presents with new laceration on right arm. He reports injuring yesterday 1 PM when breaking down a shed. Cut with rusted piece of aluminum. He treated with peroxide and applied antibioitic ointment. Minimal pain and redness.   Last Td: ?  Also noted 2 months ago during basketball, he thinks  forced flxion.  He iced it. Immediately and for a few weeks after he noted inability to do thumb abduction.  Improved strength but not back all the way  Yet. Pain in on radial aspect of  MCP joint.      Review of Systems  Constitutional: Negative for fever, fatigue and unexpected weight change.  HENT: Negative for congestion, ear pain, postnasal drip and rhinorrhea.   Eyes: Negative for pain.  Respiratory: Negative for cough, shortness of breath and wheezing.   Cardiovascular: Negative for chest pain.  Skin: Negative for rash.  Neurological: Positive for weakness. Negative for numbness.       Objective:   Physical Exam  Constitutional: Vital signs are normal. He appears well-developed and well-nourished.  HENT:  Head: Normocephalic.  Right Ear: Hearing normal.  Left Ear: Hearing normal.  Nose: Nose normal.  Mouth/Throat: Oropharynx is clear and moist and mucous membranes are normal.  Neck: Trachea normal. Carotid bruit is not present. No thyroid mass and no thyromegaly present.  Cardiovascular: Normal rate, regular rhythm and normal pulses.  Exam reveals no gallop, no distant heart sounds and no friction rub.   No murmur heard. No peripheral edema  Pulmonary/Chest: Effort normal and breath sounds normal. No respiratory distress.  Musculoskeletal:       Right hand: He exhibits tenderness. He exhibits normal range of motion, no bony tenderness, normal two-point discrimination, normal capillary refill, no deformity, no laceration and no swelling. Normal sensation  noted. Normal strength noted. He exhibits no thumb/finger opposition.       Left hand: He exhibits normal range of motion, no tenderness, no bony tenderness, normal two-point discrimination, normal capillary refill, no deformity, no laceration and no swelling. Normal sensation noted. Normal strength noted. He exhibits no thumb/finger opposition.       Hands: ligaments intact  Neurological: He has normal strength. He displays no atrophy. No sensory deficit. He exhibits normal muscle tone.  Skin: Skin is warm, dry and intact. No rash noted.  Right dorsal arm : shallow laceration well approximated 6 inches in length, minimal surrounding redness.  Psychiatric: He has a normal mood and affect. His speech is normal and behavior is normal. Thought content normal.          Assessment & Plan:

## 2015-02-14 NOTE — Progress Notes (Signed)
Pre visit review using our clinic review tool, if applicable. No additional management support is needed unless otherwise documented below in the visit note. 

## 2015-02-14 NOTE — Assessment & Plan Note (Signed)
2 month out from injury. Improving strength, no ligament or tendon injury seen. Work on hand PT at home, NSAID for tenderness and ice after work/game.

## 2015-02-14 NOTE — Patient Instructions (Signed)
Keep laceration clean with warm soapy water daily. Apply antibiotic ointment. Work on increasing grip strength in right thumb with ball exercises as discussed.

## 2015-02-14 NOTE — Assessment & Plan Note (Signed)
Tdap given.  Well healing, well approximated edges.

## 2015-04-17 ENCOUNTER — Ambulatory Visit (INDEPENDENT_AMBULATORY_CARE_PROVIDER_SITE_OTHER): Payer: 59 | Admitting: Family Medicine

## 2015-04-17 ENCOUNTER — Encounter: Payer: Self-pay | Admitting: Family Medicine

## 2015-04-17 VITALS — BP 110/76 | HR 52 | Temp 97.8°F | Ht 70.0 in | Wt 199.8 lb

## 2015-04-17 DIAGNOSIS — L237 Allergic contact dermatitis due to plants, except food: Secondary | ICD-10-CM | POA: Diagnosis not present

## 2015-04-17 MED ORDER — DEXAMETHASONE SODIUM PHOSPHATE 10 MG/ML IJ SOLN
10.0000 mg | Freq: Once | INTRAMUSCULAR | Status: AC
  Administered 2015-04-17: 10 mg via INTRAMUSCULAR

## 2015-04-17 NOTE — Progress Notes (Signed)
Pre visit review using our clinic review tool, if applicable. No additional management support is needed unless otherwise documented below in the visit note. 

## 2015-04-17 NOTE — Progress Notes (Signed)
   Dr. Karleen HampshireSpencer T. Pedro Oldenburg, MD, CAQ Sports [facility] [address] [place], [id number] Phone: [phone] Fax: [phone]  04/17/2015  Patient: Kenneth SearBrandon L Terrell, MRN: [id number], DOB: 09/23/1991, 24 y.o.  Primary Physician:  Eustaquio BoydenJavier Gutierrez, MD  Chief Complaint: Rash  Subjective:   Kenneth SearBrandon L Fischetti is a 24 y.o. very pleasant male patient who presents with the following:  Poison ivy: outbreak of poison ivy. 1 week. Arms, back, legs.   Middle and elementary school: took some antidepressants.   Prefers a shot.   Past Medical History, Surgical History, Social History, Family History, Problem List, Medications, and Allergies have been reviewed and updated if relevant.   GEN: No acute illnesses, no fevers, chills. GI: No n/v/d, eating normally Pulm: No SOB Interactive and getting along well at home.  Otherwise, ROS is as per the HPI.  Objective:   BP 110/76 mmHg  Pulse 52  Temp(Src) 97.8 F (36.6 C) (Oral)  Ht 5\' 10"  (1.778 m)  Wt 199 lb 12 oz (90.606 kg)  BMI 28.66 kg/m2  GEN: WDWN, NAD, Non-toxic, A & O x 3 HEENT: Atraumatic, Normocephalic. Neck supple. No masses, No LAD. Ears and Nose: No external deformity. EXTR: No c/c/e NEURO Normal gait.  PSYCH: Normally interactive. Conversant. Not depressed or anxious appearing.  Calm demeanor.  SKIN: extensive poison ivy, arms, back and legs  Laboratory and Imaging Data:  Assessment and Plan:   Poison ivy - Plan: dexamethasone (DECADRON) injection 10 mg  I confirmed with him his mental health history, he has not had any problems since middle school, and he has not been on medication since he was 24 yo.  Signed,  Elpidio GaleaSpencer T. Caelin Rayl, MD   Patient's Medications   No medications on file

## 2015-08-26 ENCOUNTER — Telehealth: Payer: Self-pay | Admitting: Family Medicine

## 2015-08-26 ENCOUNTER — Ambulatory Visit: Payer: 59 | Admitting: Family Medicine

## 2015-08-26 NOTE — Telephone Encounter (Signed)
Pt did not come in for their appt today for an acute visit. Please let me know if pt needs to be contacted immediately for follow up or no follow up needed. Best phone number to contact pt is [phone].

## 2015-08-26 NOTE — Telephone Encounter (Signed)
No f/u

## 2015-08-27 ENCOUNTER — Encounter: Payer: Self-pay | Admitting: Family Medicine

## 2015-08-27 ENCOUNTER — Ambulatory Visit (INDEPENDENT_AMBULATORY_CARE_PROVIDER_SITE_OTHER): Payer: 59 | Admitting: Family Medicine

## 2015-08-27 VITALS — BP 122/78 | HR 64 | Temp 98.0°F | Wt 196.5 lb

## 2015-08-27 DIAGNOSIS — S63641A Sprain of metacarpophalangeal joint of right thumb, initial encounter: Secondary | ICD-10-CM

## 2015-08-27 DIAGNOSIS — M545 Low back pain, unspecified: Secondary | ICD-10-CM

## 2015-08-27 DIAGNOSIS — S46002A Unspecified injury of muscle(s) and tendon(s) of the rotator cuff of left shoulder, initial encounter: Secondary | ICD-10-CM

## 2015-08-27 MED ORDER — DICLOFENAC SODIUM 1 % TD GEL: 1.0000 "application " | Freq: Three times a day (TID) | TRANSDERMAL | Status: DC

## 2015-08-27 NOTE — Progress Notes (Signed)
BP 122/78 mmHg  Pulse 64  Temp(Src) 98 F (36.7 C) (Oral)  Wt 196 lb 8 oz (89.132 kg)   CC: back pain Subjective:    Patient ID: Kenneth Terrell, male    DOB: [birth year], 24 y.o.   MRN: [id number]  HPI: KASHIF POOLER is a 24 y.o. male presenting on 08/27/2015 for Back Pain and Shoulder Pain   4 mo h/o intermittent lower back pain. biofreeze and heating pad are helpful. BC powders and advil not helpful, upset stomach. Back pain started while cutting trees down in June. Actually back is feeling better today (after biofreeze and heating pad). He has been playing basketball on concrete floor. Denies numbness/weakness, shooting pain down legs.  Also with L shoulder pain. Yesterday while playing basketball collided with another player and felt shoulder pop. 1.5 mo ago also injured L shoulder - similar injury. Points to lateral shoulder as site of pain.   Also with R thumb pain. Yesterday while playing basketball injured thumb. Iced thumb last night. No redness, swelling , warmth of thumb.  Relevant past medical, surgical, family and social history reviewed and updated as indicated. Interim medical history since our last visit reviewed. Allergies and medications reviewed and updated. No current outpatient prescriptions on file prior to visit.   No current facility-administered medications on file prior to visit.    Review of Systems Per HPI unless specifically indicated above     Objective:    BP 122/78 mmHg  Pulse 64  Temp(Src) 98 F (36.7 C) (Oral)  Wt 196 lb 8 oz (89.132 kg)  Wt Readings from Last 3 Encounters:  08/27/15 196 lb 8 oz (89.132 kg)  04/17/15 199 lb 12 oz (90.606 kg)  02/14/15 202 lb 8 oz (91.853 kg)    Physical Exam  Constitutional: He appears well-developed and well-nourished. No distress.  Muscular  Musculoskeletal: He exhibits no edema.  No pain midline cervical thoracic or lumbar spine No paracervical mm tenderness, no trap mm tenderness R shoulder  WNL L Shoulder exam: No deformity of shoulders on inspection. No pain with palpation of shoulder landmarks. FROM in abduction and forward flexion, discomfort between 80-120 degrees. + pain with testing SITS in internal rotation. + pain with empty can sign. Neg Yerguson, Speed test. No significant impingement. No pain with crossover test. No pain with rotation of humeral head in GH joint.   No paraspinous mm tenderness Neg SLR bilaterally. Neg FABER. No pain at SIJ, GTB or sciatic notch bilaterally.  L thumb WNL R thumb - tender swelling present lateral 1st MCP joint. No UCL laxity. No pain at anatomical snuffbox or at [facility]. FROM at thumb and wrist. Neurovascularly intact.  Skin: Skin is warm and dry. No rash noted.  Psychiatric: He has a normal mood and affect.  Vitals reviewed.      Assessment & Plan:   Problem List Items Addressed This Visit    Lower back pain    Anticipate benign lumbar strain/sprain, pain has currently resolved, not consistent with HNP or other cause. Seems to have strong core muscles. Encouraged stretching after all exercise. Discussed importance of supportive shoewear Update if recurrent lower back pain for referral to chiropractor. Pt agrees.      Sprain of metacarpophalangeal joint of right thumb    Recurrent injury. No evidence of fracture or gamekeeper's thumb injury. Place in thumb spica wrist brace today. voltaren gel, continue ice. Oral NSAIDs cause GI distress. If not improving, consider xray R thumb. pt  agrees with plan.      Injury of left rotator cuff - Primary    Anticipate supraspinatus tendonitis vs injury, not consistent with full thickness tear, shoulder separation or other injury. rec PT, voltaren gel (oral NSAIDs cause GI distress), continue ice/heat. Given exercises from [facility] pt advisor on RTC injury. Update if not improving with treatment plan for referral to sports med.  Pt agrees with plan.      Relevant Orders   Ambulatory  referral to Physical Therapy       Follow up plan: Return if symptoms worsen or fail to improve.

## 2015-08-27 NOTE — Assessment & Plan Note (Signed)
Recurrent injury. No evidence of fracture or gamekeeper's thumb injury. Place in thumb spica wrist brace today. voltaren gel, continue ice. Oral NSAIDs cause GI distress. If not improving, consider xray R thumb. pt agrees with plan.

## 2015-08-27 NOTE — Assessment & Plan Note (Signed)
Anticipate benign lumbar strain/sprain, pain has currently resolved, not consistent with HNP or other cause. Seems to have strong core muscles. Encouraged stretching after all exercise. Discussed importance of supportive shoewear Update if recurrent lower back pain for referral to chiropractor. Pt agrees.

## 2015-08-27 NOTE — Assessment & Plan Note (Addendum)
Anticipate supraspinatus tendonitis vs injury, not consistent with full thickness tear, shoulder separation or other injury. rec PT, voltaren gel (oral NSAIDs cause GI distress), continue ice/heat. Given exercises from [facility] pt advisor on RTC injury. Update if not improving with treatment plan for referral to sports med.  Pt agrees with plan.

## 2015-08-27 NOTE — Progress Notes (Signed)
Pre visit review using our clinic review tool, if applicable. No additional management support is needed unless otherwise documented below in the visit note. 

## 2015-08-27 NOTE — Patient Instructions (Addendum)
For thumb - you have a thumb sprain. Continue icing thumb joint, try voltaren gel. We will place you in thumb spica wrist brace. If not improving with this let us know. For back - looking ok today. Possible lumbar strain. If pain returning, let me know for referral to chiropractor. Make sure to stretch after exercising every time you exercise. For left shoulder - I think you injured your rotator cuff. Do exercises provided today. Try voltaren gel. We will refer you to physical therapy. If no improvement or worsening, return to see our sports medicine doctor Dr Patsy Lager.

## 2015-09-11 ENCOUNTER — Encounter: Payer: Self-pay | Admitting: *Deleted

## 2015-09-11 ENCOUNTER — Ambulatory Visit (INDEPENDENT_AMBULATORY_CARE_PROVIDER_SITE_OTHER): Payer: 59 | Admitting: Family Medicine

## 2015-09-11 ENCOUNTER — Encounter: Payer: Self-pay | Admitting: Family Medicine

## 2015-09-11 VITALS — BP 108/68 | HR 52 | Temp 97.9°F | Wt 199.2 lb

## 2015-09-11 DIAGNOSIS — M62830 Muscle spasm of back: Secondary | ICD-10-CM | POA: Diagnosis not present

## 2015-09-11 MED ORDER — METHOCARBAMOL 750 MG PO TABS: 375.0000 mg | ORAL_TABLET | Freq: Three times a day (TID) | ORAL | Status: DC | PRN
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
## 2015-09-11 NOTE — Patient Instructions (Signed)
I think this is lumbar paraspinous and lat dorsi muscle spasm/strain on left - treat with robaxin muscle relaxant sent to [facility], take aleve 2 tablets twice daily with food for next 5 days, heating pad to back, and do exercises provided today. Out of work for 5 days. Return here or seek urgent care if worsening back pain, spreading numbness, or loss of function of leg or bowel/bladder accidents.

## 2015-09-11 NOTE — Progress Notes (Signed)
Pre visit review using our clinic review tool, if applicable. No additional management support is needed unless otherwise documented below in the visit note. 
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
## 2015-09-27 ENCOUNTER — Encounter: Payer: Self-pay | Admitting: Family Medicine

## 2015-09-27 ENCOUNTER — Ambulatory Visit (INDEPENDENT_AMBULATORY_CARE_PROVIDER_SITE_OTHER)
Admission: RE | Admit: 2015-09-27 | Discharge: 2015-09-27 | Disposition: A | Discharge status: Home / Self Care | Payer: 59 | Source: Ambulatory Visit | Attending: Family Medicine | Admitting: Family Medicine

## 2015-09-27 ENCOUNTER — Ambulatory Visit (INDEPENDENT_AMBULATORY_CARE_PROVIDER_SITE_OTHER): Payer: 59 | Admitting: Family Medicine

## 2015-09-27 VITALS — BP 130/76 | HR 46 | Temp 98.3°F | Ht 70.0 in | Wt 195.2 lb

## 2015-09-27 DIAGNOSIS — M545 Low back pain, unspecified: Secondary | ICD-10-CM

## 2015-09-27 MED ORDER — NAPROXEN 500 MG PO TABS: 500.0000 mg | ORAL_TABLET | Freq: Two times a day (BID) | ORAL | Status: DC
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
## 2015-09-27 NOTE — Patient Instructions (Signed)
Xray now  Thinking about physical therapy  Try the naproxen with food twice daily (don't use the gel with this)  Use the muscle relaxer as needed -is fine  Use heat  Walking is the best thing for your back   Will update when films are read    Off work today and Monday

## 2015-09-27 NOTE — Progress Notes (Signed)
Pre visit review using our clinic review tool, if applicable. No additional management support is needed unless otherwise documented below in the visit note. 

## 2015-09-29 NOTE — Assessment & Plan Note (Signed)
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

## 2015-09-30 ENCOUNTER — Telehealth: Payer: Self-pay | Admitting: Family Medicine

## 2015-09-30 DIAGNOSIS — M545 Low back pain, unspecified: Secondary | ICD-10-CM

## 2015-09-30 NOTE — Telephone Encounter (Signed)
-----   Message from Tammi Sou, [place] sent at 09/30/2015  9:43 AM EDT ----- Pt not on Mychart so Mychart deactivated per pt request, pt notified of xray results and Dr. Marliss Coots comments on Mychart. Pt agrees with referral to PT for back, I advise pt one of our [facility] will call to schedule appt, please put referral in

## 2015-09-30 NOTE — Telephone Encounter (Signed)
Done Will cc his PCP so aware

## 2016-03-07 DIAGNOSIS — K5190 Ulcerative colitis, unspecified, without complications: Secondary | ICD-10-CM

## 2016-03-07 HISTORY — DX: Ulcerative colitis, unspecified, without complications: K51.90

## 2016-03-07 HISTORY — PX: COLONOSCOPY: SHX174

## 2016-03-11 ENCOUNTER — Ambulatory Visit: Payer: Self-pay | Admitting: Primary Care

## 2016-03-12 ENCOUNTER — Ambulatory Visit (INDEPENDENT_AMBULATORY_CARE_PROVIDER_SITE_OTHER): Payer: BLUE CROSS/BLUE SHIELD | Admitting: Internal Medicine

## 2016-03-12 ENCOUNTER — Encounter: Payer: Self-pay | Admitting: Internal Medicine

## 2016-03-12 VITALS — BP 122/64 | HR 78 | Temp 98.7°F | Wt 195.0 lb

## 2016-03-12 DIAGNOSIS — K625 Hemorrhage of anus and rectum: Secondary | ICD-10-CM | POA: Diagnosis not present

## 2016-03-12 LAB — CBC
HCT: 39 % (ref 39.0–52.0)
Hemoglobin: 12.9 g/dL — ABNORMAL LOW (ref 13.0–17.0)
MCHC: 32.9 g/dL (ref 30.0–36.0)
MCV: 93.3 fl (ref 78.0–100.0)
Platelets: 268 10*3/uL (ref 150.0–400.0)
RBC: 4.18 Mil/uL — ABNORMAL LOW (ref 4.22–5.81)
RDW: 13 % (ref 11.5–15.5)
WBC: 5.4 10*3/uL (ref 4.0–10.5)

## 2016-03-12 LAB — COMPREHENSIVE METABOLIC PANEL
ALT: 37 U/L (ref 0–53)
AST: 30 U/L (ref 0–37)
Albumin: 4 g/dL (ref 3.5–5.2)
Alkaline Phosphatase: 71 U/L (ref 39–117)
BUN: 19 mg/dL (ref 6–23)
CO2: 30 mEq/L (ref 19–32)
Calcium: 9.6 mg/dL (ref 8.4–10.5)
Chloride: 105 mEq/L (ref 96–112)
Creatinine, Ser: 1.64 mg/dL — ABNORMAL HIGH (ref 0.40–1.50)
GFR: 65.76 mL/min (ref >60.00)
Glucose, Bld: 73 mg/dL (ref 70–99)
Potassium: 4.3 mEq/L (ref 3.5–5.1)
Sodium: 139 mEq/L (ref 135–145)
Total Bilirubin: 0.5 mg/dL (ref 0.2–1.2)
Total Protein: 6.8 g/dL (ref 6.0–8.3)

## 2016-03-12 NOTE — Progress Notes (Signed)
Pre visit review using our clinic review tool, if applicable. No additional management support is needed unless otherwise documented below in the visit note. 

## 2016-03-12 NOTE — Progress Notes (Signed)
Subjective:    Patient ID: Kenneth Terrell, male    DOB: 04-04-1991, 25 y.o.   MRN: [id number]  HPI  Pt presents to the clinic today with c/o BRBPR. He reports this first started a year ago. It is intermittent but occurs > 3 days out of the week. The blood is bright red. He only notices it after wiping, but reports it is a moderate amount. He has mild abdominal cramping just prior to bowel movements but denies abdominal pain. Cramping resolves with bowel movements. He denies rectal itching or pain. He does not use NSAIDS. He is sexually active but denies anal intercourse. He denies constipation. He has no personal or family history of IBS, UC or Chron's disease.  Review of Systems      Past Medical History  Diagnosis Date  . BIPOLAR DISORDER UNSPECIFIED 01/11/2008    actually thought more depression - ?misdiagnosis  . ADHD 01/11/2008    3 YOA   . History of chlamydia 2008    treated    Current Outpatient Prescriptions  Medication Sig Dispense Refill  . glucosamine-chondroitin 500-400 MG tablet Take 3 tablets by mouth daily.    . Multiple Vitamin (MULTIVITAMIN) tablet Take 1 tablet by mouth daily.    . Pyridoxine HCl (VITAMIN B-6 PO) Take 1 capsule by mouth daily.     No current facility-administered medications for this visit.    No Known Allergies  Family History  Problem Relation Age of Onset  . Drug abuse Father   . Diabetes Maternal Uncle   . Cancer Maternal Grandmother     lung cancer met. to brain  . Depression Maternal Grandmother   . Stroke Maternal Grandmother   . CAD Other     MI; angina  . Cancer Other     colon    Social History   Social History  . Marital Status: Single    Spouse Name: N/A  . Number of Children: N/A  . Years of Education: N/A   Occupational History  . Student     Going to [employer]   Social History Main Topics  . Smoking status: Never Smoker   . Smokeless tobacco: Never Used     Comment: quit 2011  . Alcohol Use: No  . Drug Use:  No  . Sexual Activity: Yes   Other Topics Concern  . 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  Gastrointestinal: Pt reports blood in stool. Denies abdominal pain, bloating, constipation, diarrhea.  GU: Denies urgency, frequency, pain with urination, burning sensation, blood in urine, odor or discharge.  No other specific complaints in a complete review of systems (except as listed in HPI above).  Objective:   Physical Exam  BP 122/64 mmHg  Pulse 78  Temp(Src) 98.7 F (37.1 C) (Oral)  Wt 195 lb (88.451 kg)  SpO2 99% Wt Readings from Last 3 Encounters:  03/12/16 195 lb (88.451 kg)  09/27/15 195 lb 4 oz (88.565 kg)  09/11/15 199 lb 4 oz (90.379 kg)    General: Appears his stated age, in NAD. Cardiovascular:  Normal rate and rhythm. S1,S2 noted.  No murmur, rubs or gallops noted.  Pulmonary/Chest: Normal effort and positive vesicular breath sounds. No respiratory distress. No wheezes, rales or ronchi noted.  Abdomen: Soft and nontender. Normal bowel sounds. No distention or masses noted.  Rectal: No external hemorrhoid noted. Normal rectal tone. No internal mass noted. No active bleeding. He shows me a picture on his phone- moderate amount of blood noted in toilet.  BMET    Component Value Date/Time   NA 138 02/04/2015 1428   K 4.4 02/04/2015 1428   CL 103 02/04/2015 1428   CO2 31 02/04/2015 1428   GLUCOSE 95 02/04/2015 1428   BUN 13 02/04/2015 1428   CREATININE 1.31 02/04/2015 1428   CALCIUM 9.7 02/04/2015 1428   GFRNONAA 110.76 07/24/2009 0926    Lipid Panel     Component Value Date/Time   CHOL 167  01/10/2013 0855   TRIG 51.0 01/10/2013 0855   HDL 55.90 01/10/2013 0855   CHOLHDL 3 01/10/2013 0855   VLDL 10.2 01/10/2013 0855   LDLCALC 101* 01/10/2013 0855    CBC    Component Value Date/Time   WBC 10.2 12/04/2009 0321   RBC 4.06* 12/04/2009 0321   HGB 12.9* 12/04/2009 0327   HCT 38.0* 12/04/2009 0327   PLT 220 12/04/2009 0321   MCV 93.2 12/04/2009 0321   MCHC 34.4 12/04/2009 0321   RDW 12.6 12/04/2009 0321   LYMPHSABS 0.9 12/04/2009 0321   MONOABS 0.6 12/04/2009 0321   EOSABS 0.0 12/04/2009 0321   BASOSABS 0.0 12/04/2009 0321    Hgb A1C No results found for: HGBA1C       Assessment & Plan:   BRBPR:  Hemoccult positive CBC and CMET today Given duration of bleeding, refferal placed to GI for further evaluation   Follow up with PCP after your evaluation/testing by GI

## 2016-03-12 NOTE — Patient Instructions (Signed)
Stool for Occult Blood Test WHY AM I HAVING THIS TEST? Stool for occult blood, or fecal occult blood test (FOBT), is a test that is used to screen for gastrointestinal (GI) bleeding, which may be an indicator of colon cancer. This test can also detect small amounts of blood in your stool (feces) from other causes, such as ulcers or hemorrhoids. This test is usually done as part of an annual routine examination after age 26. WHAT KIND OF SAMPLE IS TAKEN? A sample of your stool is required for this test. Your health care provider may collect the sample with a swab of the rectum. Or, you may be instructed to collect the sample in a container at home. If you are instructed to collect the sample, your health care provider will provide you with the instructions and the supplies that you will need to do that. WILL I NEED TO COLLECT SAMPLES AT HOME?  A stool sample may need to be collected at home. When collecting a sample at home, make sure that you:  Use the sterile containers and other supplies that were given to you from the lab.  Do not mix urine, toilet paper, or water with your sample.  Label all slides and containers with your name and the date when you collected the sample. Your health care provider or lab staff will give you one or more test "cards." You will collect a separate sample from three different stools, usually on different days that follow each other. Follow these steps for each sample: 1. Collect a stool sample into a clean container. 2. With an applicator stick, apply a thin smear of stool sample onto each filter paper square or window that is on the card. 3. Allow the filter paper to dry. After it is dry, the sample will be stable. Usually, you will return all of the samples to your health care provider or lab at the same time.  HOW DO I PREPARE FOR THE TEST?  Do not eat any red meat within three days before testing.  Follow your health care provider's instructions about eating  and drinking prior to the test. Your health care provider may instruct you to avoid other foods or substances.  Ask your health care provider about taking or not taking your medicines prior to the test. You may be instructed to avoid certain medicines that are known to interfere with this test. HOW ARE THE TEST RESULTS REPORTED? Your test results will be reported as either positive or negative. It is your responsibility to obtain your test results. Ask the lab or department performing the test when and how you will get your results. WHAT DO THE RESULTS MEAN? A negative test result means that there is no occult blood within the stool. A negative result is normal. A positive test result may mean that there is blood in the stool. Causes of blood in the stool include:  GI tumors.  Certain GI diseases.  GI trauma or recent surgery.  Hemorrhoids. Talk with your health care provider to discuss your results, treatment options, and if necessary, the need for more tests. Talk with your health care provider if you have any questions about your results.   This information is not intended to replace advice given to you by your health care provider. Make sure you discuss any questions you have with your health care provider.   Document Released: 12/18/2004 Document Revised: 12/14/2014 Document Reviewed: 04/20/2014 Elsevier Interactive Patient Education [employer].

## 2016-03-16 ENCOUNTER — Telehealth: Payer: Self-pay | Admitting: Family Medicine

## 2016-03-16 NOTE — Telephone Encounter (Signed)
Kenneth ReichertMarian pt care coordinator is going to contact Dr Marvell FullerGessner's office to see if pt can be seen sooner.

## 2016-03-16 NOTE — Telephone Encounter (Signed)
PLEASE NOTE: All timestamps contained within this report are represented as [place] Standard Time. CONFIDENTIALTY NOTICE: This fax transmission is intended only for the addressee. It contains information that is legally privileged, confidential or otherwise protected from use or disclosure. If you are not the intended recipient, you are strictly prohibited from reviewing, disclosing, copying using or disseminating any of this information or taking any action in reliance on or regarding this information. If you have received this fax in error, please notify us immediately by telephone so that we can arrange for its return to [place]. Phone: [phone], Toll-Free: [phone], Fax: [phone] Page: 1 of 1 Call Id: [id number] [place] Patient Name: Kenneth Terrell DOB: Sep 25, 1991 Initial Comment Caller states, son is losing blood every day, through his stool, but the GI Dr appt is not until sometime in May. He is feeling cold now as well. Nurse Assessment Nurse: Marcelline Deist, RN, Lynda Date/Time (Eastern Time): 03/16/2016 2:34:55 PM Confirm and document reason for call. If symptomatic, describe symptoms. You must click the next button to save text entered. ---Caller states he is losing blood every day, through his stool, but the GI Dr. appt. is not until April 28th. He is feeling cold now as well. His stomach hurts before the episodes when he has to use the bathroom. Sometimes he has rectal bleeding without stool. His maternal grandmother had colon cancer. Has the patient traveled out of the country within the last 30 days? ---Not Applicable Does the patient have any new or worsening symptoms? ---Yes Will a triage be completed? ---Yes Related visit to physician within the last 2 weeks? ---Yes Does the PT have any chronic conditions? (i.e. diabetes, asthma, etc.) ---No Is this a behavioral health or  substance abuse call? ---No Guidelines Guideline Title Affirmed Question Affirmed Notes Rectal Bleeding [1] MODERATE rectal bleeding (small blood clots, passing blood without stool, or toilet water turns red) AND [2] more than once a day Final Disposition User Go to ED Now Marcelline Deist, RN, Kermit Balo Comments Caller states bleeding happens several times a day, enough to turn the toilet water red. he is not sure if there are clots or not. Mother would like her son seen by a GI Dr. sooner than April 28th. She would like to be called with another referral or plan of action. Referrals GO TO FACILITY UNDECIDED REFERRED TO PCP OFFICE Disagree/Comply: Comply

## 2016-03-16 NOTE — Telephone Encounter (Signed)
GI ppt was moved up to tomorrow, 03/17/16 at 8:30am with Dr Loletha Carrow, patient is aware of new appt.

## 2016-03-17 ENCOUNTER — Encounter: Payer: Self-pay | Admitting: Gastroenterology

## 2016-03-17 ENCOUNTER — Ambulatory Visit (INDEPENDENT_AMBULATORY_CARE_PROVIDER_SITE_OTHER): Payer: BLUE CROSS/BLUE SHIELD | Admitting: Gastroenterology

## 2016-03-17 VITALS — BP 120/82 | HR 64 | Ht 70.0 in | Wt 200.0 lb

## 2016-03-17 DIAGNOSIS — K625 Hemorrhage of anus and rectum: Secondary | ICD-10-CM

## 2016-03-17 DIAGNOSIS — R1033 Periumbilical pain: Secondary | ICD-10-CM | POA: Diagnosis not present

## 2016-03-17 DIAGNOSIS — R197 Diarrhea, unspecified: Secondary | ICD-10-CM

## 2016-03-17 NOTE — Progress Notes (Signed)
[facility] Consult Note:  History: Kenneth Terrell 03/17/2016  Referring physician: [name], MD  Reason for consult/chief complaint: Rectal Bleeding   Subjective HPI: Kenneth Terrell was referred by his PCP for about 2 months of rectal bleeding and diarrhea. He does not recall any clear trigger, he just started developing crampy lower abdominal pain with some urgency for BMs, as well as loose stool mixed with bright red blood. This occurs every day, and he also describes what sounds like tenesmus. He denies anorexia, weight loss, painful redness of the eyes, skin rash, chest pain, dyspnea, dysuria He had no foreign travel or antibiotic use prior to symptom onset.  ROS:  Review of Systems  Constitutional: Negative for appetite change and unexpected weight change.  HENT: Negative for mouth sores and voice change.   Eyes: Negative for pain and redness.  Respiratory: Negative for cough and shortness of breath.   Cardiovascular: Negative for chest pain and palpitations.  Genitourinary: Negative for dysuria and hematuria.  Musculoskeletal: Negative for myalgias and arthralgias.  Skin: Negative for pallor and rash.  Neurological: Negative for weakness and headaches.  Hematological: Negative for adenopathy.     Past Medical History: Past Medical History  Diagnosis Date  . BIPOLAR DISORDER UNSPECIFIED 01/11/2008    actually thought more depression - ?misdiagnosis  . ADHD 01/11/2008    3 YOA   . History of chlamydia 2008    treated     Past Surgical History: Past Surgical History  Procedure Laterality Date  . [facility] admission  1999    x 1 week  . Keloid excision  10/2007    left ear (Dr. Towanda Malkin)  . External ear surgery       Family History: Family History  Problem Relation Age of Onset  . Drug abuse Father   . Diabetes Maternal Uncle   . Cancer Maternal Grandmother     lung cancer met. to brain  . Depression Maternal Grandmother   . Stroke Maternal  Grandmother   . CAD Other     MI; angina  . Cancer Other     colon    Social History: Social History   Social History  . Marital Status: Single    Spouse Name: N/A  . Number of Children: N/A  . Years of Education: N/A   Occupational History  . Student     Going to [employer]   Social History Main Topics  . Smoking status: Never Smoker   . Smokeless tobacco: Never Used     Comment: quit 2011  . Alcohol Use: No  . Drug Use: No  . Sexual Activity: Yes   Other Topics Concern  . 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 glucosamine-chondroitin 500-400 MG tablet Take 3 tablets by mouth daily.    . Multiple Vitamin (MULTIVITAMIN) tablet Take 1 tablet by mouth daily.    . Pyridoxine HCl (VITAMIN B-6 PO) Take 1 capsule by mouth daily.     No current facility-administered medications for this visit.      ___________________________________________________________________ Objective  Exam:  BP 120/82 mmHg  Pulse 64  Ht 5' 10"  (1.778 m)  Wt 200 lb (90.719 kg)  BMI 28.70 kg/m2   General: this is a(n) Healthy-appearing young man with good muscle mass   Eyes: sclera anicteric, no redness  ENT: oral mucosa moist without lesions, no cervical or supraclavicular lymphadenopathy, good dentition  CV: RRR without murmur, S1/S2, no JVD, no peripheral edema  Resp: clear to auscultation bilaterally, normal RR and effort noted  GI: soft, mild LLQ tenderness, with active bowel sounds. No guarding or palpable organomegaly noted.  Skin; warm and dry, no rash or jaundice noted, multiple  tattoos  Neuro: awake, alert and oriented x 3. Normal gross motor function and fluent speech  Labs:  Lab Results  Component Value Date   WBC 5.4 03/12/2016   HGB 12.9* 03/12/2016   HCT 39.0 03/12/2016   MCV 93.3 03/12/2016   PLT 268.0 03/12/2016     Assessment: Encounter Diagnoses  Name Primary?  . Diarrhea, unspecified type Yes  . Rectal bleeding   . Periumbilical abdominal cramping     I think he most likely has IBD, less likely hemorrhoids because of the pain and change in bowel habits, very unlikely malignancy at his age with this symptom complex.  Plan:  Colonoscopy next week.  The benefits and risks of the planned procedure were described in detail with the patient or (when appropriate) their health care proxy.  Risks were outlined as including, but not limited to, bleeding, infection, perforation, adverse medication reaction leading to cardiac or pulmonary decompensation, or pancreatitis (if ERCP).  The limitation of incomplete mucosal visualization was also discussed.  No guarantees or warranties were given.   Thank you for the courtesy of this consult.  Please call me with any questions or concerns.  Nelida Meuse III

## 2016-03-17 NOTE — Patient Instructions (Addendum)
You have been scheduled for a colonoscopy. Please follow written instructions given to you at your visit today.  Please pick up your prep supplies at the pharmacy within the next 1-3 days. If you use inhalers (even only as needed), please bring them with you on the day of your procedure. Your physician has requested that you go to www.startemmi.com and enter the access code given to you at your visit today. This web site gives a general overview about your procedure. However, you should still follow specific instructions given to you by our office regarding your preparation for the procedure.   If you are age 26 or younger, your body mass index should be between 19-25. Your Body mass index is 28.7 kg/(m^2). If this is out of the aformentioned range listed, please consider follow up with your Primary Care Provider.    Thank you for choosing [facility]  Dr Amada JupiterHenry Danis III

## 2016-03-23 ENCOUNTER — Other Ambulatory Visit: Payer: Self-pay | Admitting: Gastroenterology

## 2016-03-23 ENCOUNTER — Ambulatory Visit (AMBULATORY_SURGERY_CENTER): Payer: BLUE CROSS/BLUE SHIELD | Admitting: Gastroenterology

## 2016-03-23 ENCOUNTER — Encounter: Payer: Self-pay | Admitting: Gastroenterology

## 2016-03-23 VITALS — BP 122/62 | HR 52 | Temp 96.9°F | Resp 18

## 2016-03-23 DIAGNOSIS — R197 Diarrhea, unspecified: Secondary | ICD-10-CM

## 2016-03-23 DIAGNOSIS — K625 Hemorrhage of anus and rectum: Secondary | ICD-10-CM | POA: Diagnosis not present

## 2016-03-23 MED ORDER — SODIUM CHLORIDE 0.9 % IV SOLN: 500.0000 mL | INTRAVENOUS | Status: DC

## 2016-03-23 MED ORDER — MESALAMINE 1000 MG RE SUPP: 1000.0000 mg | Freq: Every day | RECTAL | Status: DC

## 2016-03-23 MED ORDER — BUDESONIDE 9 MG PO TB24: 1.0000 | ORAL_TABLET | Freq: Every day | ORAL | Status: DC

## 2016-03-23 NOTE — Patient Instructions (Signed)
Discharge instructions given. Biopsies taken. Resume previous medications. YOU HAD AN ENDOSCOPIC PROCEDURE TODAY AT THE [facility]:   Refer to the procedure report that was given to you for any specific questions about what was found during the examination.  If the procedure report does not answer your questions, please call your gastroenterologist to clarify.  If you requested that your care partner not be given the details of your procedure findings, then the procedure report has been included in a sealed envelope for you to review at your convenience later.  YOU SHOULD EXPECT: Some feelings of bloating in the abdomen. Passage of more gas than usual.  Walking can help get rid of the air that was put into your GI tract during the procedure and reduce the bloating. If you had a lower endoscopy (such as a colonoscopy or flexible sigmoidoscopy) you may notice spotting of blood in your stool or on the toilet paper. If you underwent a bowel prep for your procedure, you may not have a normal bowel movement for a few days.  Please Note:  You might notice some irritation and congestion in your nose or some drainage.  This is from the oxygen used during your procedure.  There is no need for concern and it should clear up in a day or so.  SYMPTOMS TO REPORT IMMEDIATELY:   Following lower endoscopy (colonoscopy or flexible sigmoidoscopy):  Excessive amounts of blood in the stool  Significant tenderness or worsening of abdominal pains  Swelling of the abdomen that is new, acute  Fever of 100F or higher   For urgent or emergent issues, a gastroenterologist can be reached at any hour by calling [phone].   DIET: Your first meal following the procedure should be a small meal and then it is ok to progress to your normal diet. Heavy or fried foods are harder to digest and may make you feel nauseous or bloated.  Likewise, meals heavy in dairy and vegetables can increase bloating.  Drink  plenty of fluids but you should avoid alcoholic beverages for 24 hours.  ACTIVITY:  You should plan to take it easy for the rest of today and you should NOT DRIVE or use heavy machinery until tomorrow (because of the sedation medicines used during the test).    FOLLOW UP: Our staff will call the number listed on your records the next business day following your procedure to check on you and address any questions or concerns that you may have regarding the information given to you following your procedure. If we do not reach you, we will leave a message.  However, if you are feeling well and you are not experiencing any problems, there is no need to return our call.  We will assume that you have returned to your regular daily activities without incident.  If any biopsies were taken you will be contacted by phone or by letter within the next 1-3 weeks.  Please call us at [phone] if you have not heard about the biopsies in 3 weeks.    SIGNATURES/CONFIDENTIALITY: You and/or your care partner have signed paperwork which will be entered into your electronic medical record.  These signatures attest to the fact that that the information above on your After Visit Summary has been reviewed and is understood.  Full responsibility of the confidentiality of this discharge information lies with you and/or your care-partner. 

## 2016-03-23 NOTE — Op Note (Signed)
[facility] Patient Name: Kenneth Terrell Procedure Date: 03/23/2016 10:51 AM MRN: [id number] Endoscopist: Sherilyn Cooter L. [name] , MD Age: 25 Date of Birth: September 30, 1991 Gender: Male Procedure:                Colonoscopy Indications:              Lower abdominal pain, Chronic diarrhea, Rectal                            bleeding Medicines:                Monitored Anesthesia Care Procedure:                Pre-Anesthesia Assessment:                           - Prior to the procedure, a History and Physical                            was performed, and patient medications and                            allergies were reviewed. The patient's tolerance of                            previous anesthesia was also reviewed. The risks                            and benefits of the procedure and the sedation                            options and risks were discussed with the patient.                            All questions were answered, and informed consent                            was obtained. Prior Anticoagulants: The patient has                            taken no previous anticoagulant or antiplatelet                            agents. ASA Grade Assessment: I - A normal, healthy                            patient. After reviewing the risks and benefits,                            the patient was deemed in satisfactory condition to                            undergo the procedure.                           After obtaining  informed consent, the colonoscope                            was passed under direct vision. Throughout the                            procedure, the patient's blood pressure, pulse, and                            oxygen saturations were monitored continuously. The                            Model CF-HQ190L [id number]) scope was introduced                            through the anus and advanced to the the terminal                            ileum. The colonoscopy was  performed without                            difficulty. The patient tolerated the procedure                            well. The quality of the bowel preparation was                            good. The terminal ileum, ileocecal valve,                            appendiceal orifice, and rectum were photographed.                            The quality of the bowel preparation was evaluated                            using the BBPS [facility] Bowel Preparation Scale)                            with scores of: Right Colon = 2, Transverse Colon =                            2 and Left Colon = 2. The total BBPS score equals                            6. The bowel preparation used was Miralax. Scope In: 11:01:08 AM Scope Out: 11:11:53 AM Scope Withdrawal Time: 0 hours 7 minutes 44 seconds  Total Procedure Duration: 0 hours 10 minutes 45 seconds  Findings:                 The perianal and digital rectal examinations were                            normal.  Inflammation characterized by congestion (edema),                            erythema, friability, granularity and loss of                            vascularity was found in a continuous and                            circumferential pattern from the rectum to the                            splenic flexure. This was moderate in severity.                            Biopsies were taken with a cold forceps for                            histology.                           The terminal ileum appeared normal.                           The exam was otherwise without abnormality. Complications:            No immediate complications. Estimated Blood Loss:     Estimated blood loss was minimal. Impression:               - Left-sided ulcerative colitis. Inflammation was                            found from the rectum to the splenic flexure. This                            was moderate in severity. Biopsied.                            - The examined portion of the ileum was normal.                           - The examination was otherwise normal. Recommendation:           - Patient has a contact number available for                            emergencies. The signs and symptoms of potential                            delayed complications were discussed with the                            patient. Return to normal activities tomorrow.                            Written discharge instructions were  provided to the                            patient.                           - Resume previous diet.                           - Use Uceris (budesonide) 9 mg PO daily.                           - Use Canasa 1000 mg suppository 1 per rectum QHS                            for 2 weeks.                           - Await pathology results, then arrange office                            follow up.                           - Continue present medications.                           - No recommendation at this time regarding repeat                            colonoscopy due to pending clinical progress. [name] L. Myrtie Neitheranis, MD 03/23/2016 11:20:19 AM This report has been signed electronically.

## 2016-03-23 NOTE — Progress Notes (Signed)
Report given to PACU RN, vss 

## 2016-03-23 NOTE — Progress Notes (Signed)
Called to room to assist during endoscopic procedure.  Patient ID and intended procedure confirmed with present staff. Received instructions for my participation in the procedure from the performing physician.  

## 2016-03-24 ENCOUNTER — Telehealth: Payer: Self-pay

## 2016-03-24 ENCOUNTER — Encounter: Payer: Self-pay | Admitting: Family Medicine

## 2016-03-24 NOTE — Telephone Encounter (Signed)
  Follow up Call-  Call back number 03/23/2016  Post procedure Call Back phone  # [phone]  Permission to leave phone message Yes     Patient questions:  Do you have a fever, pain , or abdominal swelling? No. Pain Score  0 *  Have you tolerated food without any problems? Yes.    Have you been able to return to your normal activities? Yes.    Do you have any questions about your discharge instructions: Diet   No. Medications  No. Follow up visit  No.  Do you have questions or concerns about your Care? No.  Actions: * If pain score is 4 or above: No action needed, pain <4.

## 2016-03-25 ENCOUNTER — Other Ambulatory Visit: Payer: Self-pay | Admitting: Family Medicine

## 2016-03-25 ENCOUNTER — Telehealth: Payer: Self-pay | Admitting: Gastroenterology

## 2016-03-25 ENCOUNTER — Other Ambulatory Visit (INDEPENDENT_AMBULATORY_CARE_PROVIDER_SITE_OTHER): Payer: BLUE CROSS/BLUE SHIELD

## 2016-03-25 DIAGNOSIS — Z13220 Encounter for screening for lipoid disorders: Secondary | ICD-10-CM | POA: Diagnosis not present

## 2016-03-25 DIAGNOSIS — N289 Disorder of kidney and ureter, unspecified: Secondary | ICD-10-CM | POA: Diagnosis not present

## 2016-03-25 DIAGNOSIS — K51811 Other ulcerative colitis with rectal bleeding: Secondary | ICD-10-CM | POA: Diagnosis not present

## 2016-03-25 LAB — COMPREHENSIVE METABOLIC PANEL
ALT: 33 U/L (ref 0–53)
AST: 32 U/L (ref 0–37)
Albumin: 4.1 g/dL (ref 3.5–5.2)
Alkaline Phosphatase: 68 U/L (ref 39–117)
BUN: 15 mg/dL (ref 6–23)
CO2: 31 mEq/L (ref 19–32)
Calcium: 9.6 mg/dL (ref 8.4–10.5)
Chloride: 102 mEq/L (ref 96–112)
Creatinine, Ser: 1.57 mg/dL — ABNORMAL HIGH (ref 0.40–1.50)
GFR: 69.14 mL/min (ref >60.00)
Glucose, Bld: 97 mg/dL (ref 70–99)
Potassium: 4.1 mEq/L (ref 3.5–5.1)
Sodium: 139 mEq/L (ref 135–145)
Total Bilirubin: 0.7 mg/dL (ref 0.2–1.2)
Total Protein: 7 g/dL (ref 6.0–8.3)

## 2016-03-25 LAB — LIPID PANEL
Cholesterol: 176 mg/dL (ref 0–200)
HDL: 57.7 mg/dL (ref >39.00)
LDL Cholesterol: 105 mg/dL — ABNORMAL HIGH (ref 0–99)
NonHDL: 118.12
Total CHOL/HDL Ratio: 3
Triglycerides: 67 mg/dL (ref 0.0–149.0)
VLDL: 13.4 mg/dL (ref 0.0–40.0)

## 2016-03-25 LAB — CBC WITH DIFFERENTIAL/PLATELET
Basophils Absolute: 0 K/uL (ref 0.0–0.1)
Basophils Relative: 0.4 % (ref 0.0–3.0)
Eosinophils Absolute: 0.2 K/uL (ref 0.0–0.7)
Eosinophils Relative: 1.8 % (ref 0.0–5.0)
HCT: 38.5 % — ABNORMAL LOW (ref 39.0–52.0)
Hemoglobin: 12.9 g/dL — ABNORMAL LOW (ref 13.0–17.0)
Lymphocytes Relative: 13.8 % (ref 12.0–46.0)
Lymphs Abs: 1.5 K/uL (ref 0.7–4.0)
MCHC: 33.5 g/dL (ref 30.0–36.0)
MCV: 92.1 fl (ref 78.0–100.0)
Monocytes Absolute: 1.1 K/uL — ABNORMAL HIGH (ref 0.1–1.0)
Monocytes Relative: 10.1 % (ref 3.0–12.0)
Neutro Abs: 7.8 K/uL — ABNORMAL HIGH (ref 1.4–7.7)
Neutrophils Relative %: 73.9 % (ref 43.0–77.0)
Platelets: 282 K/uL (ref 150.0–400.0)
RBC: 4.18 Mil/uL — ABNORMAL LOW (ref 4.22–5.81)
RDW: 12.5 % (ref 11.5–15.5)
WBC: 10.6 K/uL — ABNORMAL HIGH (ref 4.0–10.5)

## 2016-03-25 LAB — TSH: TSH: 1.85 u[IU]/mL (ref 0.35–4.50)

## 2016-03-25 NOTE — Telephone Encounter (Signed)
Awaiting auth from [employer]. Tried to call pt. No answer.

## 2016-03-26 ENCOUNTER — Encounter: Payer: Self-pay | Admitting: Gastroenterology

## 2016-03-26 NOTE — Telephone Encounter (Signed)
Patients mother is concerned about Kenneth Terrell not having any current medications for his ulcerative colitis. I explained that we are waiting for the insurance company to approve the the Uceris and [place] Canasa. I explained that I requsted them as a stat/urget review. Kenneth Terrell is going to have [place] call the insurance company and try to rush the process.

## 2016-03-26 NOTE — Telephone Encounter (Signed)
It there anything we can do while we wait for the approval on both the Uceris and the Canasa? We have samples of Uceris (12 tabs total) but not the Canasa. Please advise.

## 2016-03-26 NOTE — Telephone Encounter (Signed)
Patient notified and aware we are awaiting prior auth from insurance. Prior auth forms faxed over stat/ urgent.

## 2016-03-27 ENCOUNTER — Encounter: Payer: Self-pay | Admitting: Family Medicine

## 2016-03-27 MED ORDER — BALSALAZIDE DISODIUM 750 MG PO CAPS: 2250.0000 mg | ORAL_CAPSULE | Freq: Three times a day (TID) | ORAL | Status: DC

## 2016-03-27 MED ORDER — PREDNISONE 10 MG PO TABS: 10.0000 mg | ORAL_TABLET | Freq: Every day | ORAL | Status: DC

## 2016-03-27 NOTE — Telephone Encounter (Signed)
I reviewed your notes.  Here's what we will do to get him feeling better while we wait for his insurance company's answer.  Prednisone 10 mg tablets:  4 tablets once daily for 5 days 3 tablets once daily for 5 days 2 tablets once daily for 5 days 1 tablet once daily for 5 days Half tablet once daily for 4 days, then stop  Rx  #55,  no refills  Also, prescribe colazal 750 mg tablets.  Take three tablets three times daily .  Rx #270, RF: 2

## 2016-03-27 NOTE — Telephone Encounter (Signed)
Per Dr Myrtie Neitheranis. He would like to find out what substitutes are covered by pt insurance. Per [employer] there are not any substitutes for the Uceris, and for the Canasa the only covered medications not requiring a prior auth are Sulfasalazine tabs, Azulfidine tabs and Colazal tabs. Currently per [employer] they do have [facility] medications on urgent review and have been sent to the medical director for review (ref # [id number])

## 2016-03-27 NOTE — Telephone Encounter (Signed)
Notified pt of the updated information. He states understanding. Also left a message for pts mother Mrs. Castell.

## 2016-03-27 NOTE — Telephone Encounter (Signed)
Left a voicemail for Kenneth Terrell, informed of new RX sent to the pharmacy.

## 2016-03-30 NOTE — Telephone Encounter (Signed)
Called pt again on Friday afternoon. Pt was notified and aware. He states understanding of the new medication directions. Pt was given a follow up for 05-07-2016 to discuss long term medication use for ulcerative colitis.

## 2016-04-02 ENCOUNTER — Ambulatory Visit (INDEPENDENT_AMBULATORY_CARE_PROVIDER_SITE_OTHER): Payer: BLUE CROSS/BLUE SHIELD | Admitting: Family Medicine

## 2016-04-02 ENCOUNTER — Encounter: Payer: Self-pay | Admitting: Family Medicine

## 2016-04-02 VITALS — BP 110/80 | HR 64 | Temp 98.0°F | Ht 70.0 in | Wt 198.0 lb

## 2016-04-02 DIAGNOSIS — Z0000 Encounter for general adult medical examination without abnormal findings: Secondary | ICD-10-CM

## 2016-04-02 DIAGNOSIS — K51811 Other ulcerative colitis with rectal bleeding: Secondary | ICD-10-CM | POA: Diagnosis not present

## 2016-04-02 NOTE — Assessment & Plan Note (Addendum)
Preventative protocols reviewed and updated unless pt declined. Discussed healthy diet and lifestyle.  STD screen next visit.

## 2016-04-02 NOTE — Patient Instructions (Addendum)
Finish prednisone course. Price out and try colazal (balsalazide) for ulcerative colitis. You will likely need daily medicine to control ulcerative colitis symptoms. STD screen next time.  Return as needed or in 6 months for follow up visit and labs. Good to see you today, call us with questions. Keep appointment with Dr Myrtie Neitheranis.  Health Maintenance, Male A healthy lifestyle and preventative care can promote health and wellness.  Maintain regular health, dental, and eye exams.  Eat a healthy diet. Foods like vegetables, fruits, whole grains, low-fat dairy products, and lean protein foods contain the nutrients you need and are low in calories. Decrease your intake of foods high in solid fats, added sugars, and salt. Get information about a proper diet from your health care provider, if necessary.  Regular physical exercise is one of the most important things you can do for your health. Most adults should get at least 150 minutes of moderate-intensity exercise (any activity that increases your heart rate and causes you to sweat) each week. In addition, most adults need muscle-strengthening exercises on 2 or more days a week.   Maintain a healthy weight. The body mass index (BMI) is a screening tool to identify possible weight problems. It provides an estimate of body fat based on height and weight. Your health care provider can find your BMI and can help you achieve or maintain a healthy weight. For males 20 years and older:  A BMI below 18.5 is considered underweight.  A BMI of 18.5 to 24.9 is normal.  A BMI of 25 to 29.9 is considered overweight.  A BMI of 30 and above is considered obese.  Maintain normal blood lipids and cholesterol by exercising and minimizing your intake of saturated fat. Eat a balanced diet with plenty of fruits and vegetables. Blood tests for lipids and cholesterol should begin at age 26 and be repeated every 5 years. If your lipid or cholesterol levels are high, you  are over age 26, or you are at high risk for heart disease, you may need your cholesterol levels checked more frequently.Ongoing high lipid and cholesterol levels should be treated with medicines if diet and exercise are not working.  If you smoke, find out from your health care provider how to quit. If you do not use tobacco, do not start.  Lung cancer screening is recommended for adults aged 55-80 years who are at high risk for developing lung cancer because of a history of smoking. A yearly low-dose CT scan of the lungs is recommended for people who have at least a 30-pack-year history of smoking and are current smokers or have quit within the past 15 years. A pack year of smoking is smoking an average of 1 pack of cigarettes a day for 1 year (for example, a 30-pack-year history of smoking could mean smoking 1 pack a day for 30 years or 2 packs a day for 15 years). Yearly screening should continue until the smoker has stopped smoking for at least 15 years. Yearly screening should be stopped for people who develop a health problem that would prevent them from having lung cancer treatment.  If you choose to drink alcohol, do not have more than 2 drinks per day. One drink is considered to be 12 oz (360 mL) of beer, 5 oz (150 mL) of wine, or 1.5 oz (45 mL) of liquor.  Avoid the use of street drugs. Do not share needles with anyone. Ask for help if you need support or instructions about stopping  the use of drugs.  High blood pressure causes heart disease and increases the risk of stroke. High blood pressure is more likely to develop in:  People who have blood pressure in the end of the normal range (100-139/85-89 mm Hg).  People who are overweight or obese.  People who are African American.  If you are 73-54 years of age, have your blood pressure checked every 3-5 years. If you are 38 years of age or older, have your blood pressure checked every year. You should have your blood pressure measured  twice--once when you are at a hospital or clinic, and once when you are not at a hospital or clinic. Record the average of the two measurements. To check your blood pressure when you are not at a hospital or clinic, you can use:  An automated blood pressure machine at a pharmacy.  A home blood pressure monitor.  If you are 44-38 years old, ask your health care provider if you should take aspirin to prevent heart disease.  Diabetes screening involves taking a blood sample to check your fasting blood sugar level. This should be done once every 3 years after age 13 if you are at a normal weight and without risk factors for diabetes. Testing should be considered at a younger age or be carried out more frequently if you are overweight and have at least 1 risk factor for diabetes.  Colorectal cancer can be detected and often prevented. Most routine colorectal cancer screening begins at the age of 18 and continues through age 60. However, your health care provider may recommend screening at an earlier age if you have risk factors for colon cancer. On a yearly basis, your health care provider may provide home test kits to check for hidden blood in the stool. A small camera at the end of a tube may be used to directly examine the colon (sigmoidoscopy or colonoscopy) to detect the earliest forms of colorectal cancer. Talk to your health care provider about this at age 31 when routine screening begins. A direct exam of the colon should be repeated every 5-10 years through age 47, unless early forms of precancerous polyps or small growths are found.  People who are at an increased risk for hepatitis B should be screened for this virus. You are considered at high risk for hepatitis B if:  You were born in a country where hepatitis B occurs often. Talk with your health care provider about which countries are considered high risk.  Your parents were born in a high-risk country and you have not received a shot to  protect against hepatitis B (hepatitis B vaccine).  You have HIV or AIDS.  You use needles to inject street drugs.  You live with, or have sex with, someone who has hepatitis B.  You are a man who has sex with other men (MSM).  You get hemodialysis treatment.  You take certain medicines for conditions like cancer, organ transplantation, and autoimmune conditions.  Hepatitis C blood testing is recommended for all people born from [birth year] through 1965 and any individual with known risk factors for hepatitis C.  Healthy men should no longer receive prostate-specific antigen (PSA) blood tests as part of routine cancer screening. Talk to your health care provider about prostate cancer screening.  Testicular cancer screening is not recommended for adolescents or adult males who have no symptoms. Screening includes self-exam, a health care provider exam, and other screening tests. Consult with your health care provider about any  symptoms you have or any concerns you have about testicular cancer.  Practice safe sex. Use condoms and avoid high-risk sexual practices to reduce the spread of sexually transmitted infections (STIs).  You should be screened for STIs, including gonorrhea and chlamydia if:  You are sexually active and are younger than 24 years.  You are older than 24 years, and your health care provider tells you that you are at risk for this type of infection.  Your sexual activity has changed since you were last screened, and you are at an increased risk for chlamydia or gonorrhea. Ask your health care provider if you are at risk.  If you are at risk of being infected with HIV, it is recommended that you take a prescription medicine daily to prevent HIV infection. This is called pre-exposure prophylaxis (PrEP). You are considered at risk if:  You are a man who has sex with other men (MSM).  You are a heterosexual man who is sexually active with multiple partners.  You take drugs by  injection.  You are sexually active with a partner who has HIV.  Talk with your health care provider about whether you are at high risk of being infected with HIV. If you choose to begin PrEP, you should first be tested for HIV. You should then be tested every 3 months for as long as you are taking PrEP.  Use sunscreen. Apply sunscreen liberally and repeatedly throughout the day. You should seek shade when your shadow is shorter than you. Protect yourself by wearing long sleeves, pants, a wide-brimmed hat, and sunglasses year round whenever you are outdoors.  Tell your health care provider of new moles or changes in moles, especially if there is a change in shape or color. Also, tell your health care provider if a mole is larger than the size of a pencil eraser.  A one-time screening for abdominal aortic aneurysm (AAA) and surgical repair of large AAAs by ultrasound is recommended for men aged 59-75 years who are current or former smokers.  Stay current with your vaccines (immunizations).   This information is not intended to replace advice given to you by your health care provider. Make sure you discuss any questions you have with your health care provider.   Document Released: 05/21/2008 Document Revised: 12/14/2014 Document Reviewed: 04/20/2011 Elsevier Interactive Patient Education [employer].

## 2016-04-02 NOTE — Assessment & Plan Note (Signed)
Symptoms improved currently on prednisone course.  Did not price out or try colazal, wants to try treating disease with herbal supplements (curcumin, fish oil, probiotic, asks about green vibrance powder). Advised he will likely need chronic medication to manage chronic inflammatory bowel disease, and herbal treatments may not be ideal for him. Encouraged he start colazal until f/u with GI 05/2016 and discuss further treatment recs at that time.

## 2016-04-02 NOTE — Progress Notes (Signed)
Pre visit review using our clinic review tool, if applicable. No additional management support is needed unless otherwise documented below in the visit note. 
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
## 2016-04-03 ENCOUNTER — Ambulatory Visit: Payer: BLUE CROSS/BLUE SHIELD | Admitting: Internal Medicine

## 2016-04-15 ENCOUNTER — Encounter: Payer: Self-pay | Admitting: Family Medicine

## 2016-04-15 ENCOUNTER — Ambulatory Visit (INDEPENDENT_AMBULATORY_CARE_PROVIDER_SITE_OTHER): Payer: BLUE CROSS/BLUE SHIELD | Admitting: Family Medicine

## 2016-04-15 VITALS — BP 120/70 | HR 64 | Temp 98.2°F | Ht 70.0 in | Wt 196.0 lb

## 2016-04-15 DIAGNOSIS — Z113 Encounter for screening for infections with a predominantly sexual mode of transmission: Secondary | ICD-10-CM

## 2016-04-15 DIAGNOSIS — Z8619 Personal history of other infectious and parasitic diseases: Secondary | ICD-10-CM

## 2016-04-15 DIAGNOSIS — R369 Urethral discharge, unspecified: Secondary | ICD-10-CM | POA: Diagnosis not present

## 2016-04-15 MED ORDER — AZITHROMYCIN 1 G PO PACK: 1.0000 g | PACK | Freq: Once | ORAL | Status: DC

## 2016-04-15 NOTE — Progress Notes (Signed)
   Subjective:    Patient ID: Kenneth SearBrandon L Terrell, male    DOB: 06/08/1991, 25 y.o.   MRN: [id number]  HPI  Acute work-in visit. Patient seen with concern for possible recurrent chlamydia. He was with a new partner about 2 weeks ago. About 5 days after intercourse he noted onset of some clear urethral discharge with mild burning with urination. No purulent discharge. Onset of symptoms about 9 days ago. No fevers or chills. He states he had chlamydia twice previously and symptoms are almost exactly the same. Previous HIV and RPR testing several months ago normal. Denies any fever, chills, skin rash, adenopathy, arthralgias. He has improved symptomatically in past with Azithromycin for chlamydia infections.  Past Medical History  Diagnosis Date  . BIPOLAR DISORDER UNSPECIFIED 01/11/2008    actually thought more depression - ?misdiagnosis  . ADHD 01/11/2008    3 YOA   . History of chlamydia 2008    treated  . Ulcerative colitis (HCC) 03/2016    by colonoscopy   Past Surgical History  Procedure Laterality Date  . [facility] admission  1999    x 1 week  . Keloid excision  10/2007    left ear (Dr. [place])  . External ear surgery    . Colonoscopy  03/2016    Ulcerative colitis by biopsy ([facility])    reports that he has never smoked. He has never used smokeless tobacco. He reports that he does not drink alcohol or use illicit drugs. family history includes CAD in his other; Cancer in his maternal grandmother and other; Colon cancer in his maternal grandmother; Depression in his maternal grandmother; Diabetes in his maternal uncle; Drug abuse in his father; Stroke in his maternal grandmother. No Known Allergies    Review of Systems  Constitutional: Negative for fever, chills and unexpected weight change.  Gastrointestinal: Negative for abdominal pain.  Genitourinary: Positive for discharge (clear as per HPI). Negative for frequency, hematuria, decreased urine volume and genital sores.    Musculoskeletal: Negative for arthralgias.  Skin: Negative for rash.  Hematological: Negative for adenopathy.       Objective:   Physical Exam  Constitutional: He appears well-developed and well-nourished.  Cardiovascular: Normal rate and regular rhythm.   Pulmonary/Chest: Effort normal and breath sounds normal. No respiratory distress. He has no wheezes. He has no rales.  Skin: No rash noted.          Assessment & Plan:  Probable recurrent urethritis. Will check urine for GC and Chlamydia as well as trichomonas. Go ahead and cover empirically with Zithromax 1 g 1 dose pending results. We recommended consideration for concommitent coverage with Rocephin for GC but he declines as he is having symptoms consistent with prior chlamydia. Check HIV and RPR We discussed STD prevention with barrier protection.  Kristian CoveyBruce W Neema Barreira MD [facility]

## 2016-04-15 NOTE — Patient Instructions (Signed)
Chlamydia, Male Chlamydia is an infection. It is spread through sexual contact. Chlamydia can be in different areas of the body. These areas include the urethra, throat, or rectum. It is important to treat chlamydia as soon as possible. It can damage other organs.  CAUSES  Chlamydia is caused by bacteria. It is a sexually transmitted disease. This means that it is passed from an infected partner during intimate contact. This contact could be with the genitals, mouth, or rectal area.  SIGNS AND SYMPTOMS  There may not be any symptoms. This is often the case early in the infection. If there are symptoms, they are usually mild and may only be noticeable in the morning. Symptoms you may notice include:   Burning with urination.  Pain or swelling in the testicles.  Watery mucus-like discharge from the penis.  Long-standing (chronic) pelvic pain after frequent infections.  Pain, swelling, or itching around the anus.  A sore throat.  Itching, burning, or redness in the eyes, or discharge from the eyes. DIAGNOSIS  To diagnose this infection, your health care provider will do a pelvic exam. A sample of urine or a swab from the rectum may be taken for testing.  TREATMENT  Chlamydia is treated with antibiotic medicines. Your health care provider may test you for infection again 3 months after treatment. HOME CARE INSTRUCTIONS  Take your antibiotic medicine as directed by your health care provider. Finish the antibiotic even if you start to feel better. Incomplete treatment will put you at risk for not being able to have children (sterility).   Take medicines only as directed by your health care provider.   Rest.   Inform any sexual partners about your infection. Even if they are symptom free or have a negative culture or evaluation, they should be treated for the condition.   Do not have sex (intercourse) until treatment is completed and your health care provider says it is okay.   Keep  all follow-up visits as directed by your health care provider.   Not all test results are available during your visit. If your test results are not back during the visit, make an appointment with your health care provider to find out the results. Do not assume everything is normal if you have not heard from your health care provider or the medical facility. It is your responsibility to get your test results. SEEK MEDICAL CARE IF:  You develop new joint pain.  You have a fever. SEEK IMMEDIATE MEDICAL CARE IF:   Your pain increases.   You have abnormal discharge.   You have pain during intercourse. MAKE SURE YOU:   Understand these instructions.  Will watch your condition.  Will get help right away if you are not doing well or get worse.   This information is not intended to replace advice given to you by your health care provider. Make sure you discuss any questions you have with your health care provider.   Document Released: 11/23/2005 Document Revised: 12/14/2014 Document Reviewed: 06/01/2013 Elsevier Interactive Patient Education 2016 [employer]  

## 2016-04-15 NOTE — Progress Notes (Signed)
Pre visit review using our clinic review tool, if applicable. No additional management support is needed unless otherwise documented below in the visit note. 

## 2016-04-16 ENCOUNTER — Encounter: Payer: Self-pay | Admitting: *Deleted

## 2016-04-16 LAB — HIV ANTIBODY (ROUTINE TESTING W REFLEX): HIV 1&2 Ab, 4th Generation: NONREACTIVE

## 2016-04-16 LAB — GC/CHLAMYDIA PROBE AMP
CT Probe RNA: NOT DETECTED
GC Probe RNA: NOT DETECTED

## 2016-04-16 LAB — RPR

## 2016-05-07 ENCOUNTER — Ambulatory Visit: Payer: BLUE CROSS/BLUE SHIELD | Admitting: Gastroenterology

## 2016-05-07 ENCOUNTER — Telehealth: Payer: Self-pay

## 2016-05-07 NOTE — Telephone Encounter (Signed)
Pt has cancelled his follow up appointment for 05-07-2016. Pt. was to come in the office to discuss long term medication for ulcerative colitis (Colazol). Pt has moved his follow up to 06-23-2017.

## 2016-06-23 ENCOUNTER — Ambulatory Visit (INDEPENDENT_AMBULATORY_CARE_PROVIDER_SITE_OTHER): Payer: BLUE CROSS/BLUE SHIELD | Admitting: Gastroenterology

## 2016-06-23 ENCOUNTER — Encounter: Payer: Self-pay | Admitting: Gastroenterology

## 2016-06-23 VITALS — BP 120/76 | HR 64 | Ht 70.0 in | Wt 198.0 lb

## 2016-06-23 DIAGNOSIS — K51811 Other ulcerative colitis with rectal bleeding: Secondary | ICD-10-CM

## 2016-06-23 DIAGNOSIS — R197 Diarrhea, unspecified: Secondary | ICD-10-CM | POA: Diagnosis not present

## 2016-06-23 MED ORDER — BUDESONIDE 9 MG PO TB24: 9.0000 mg/kg/h | ORAL_TABLET | Freq: Every day | ORAL | Status: DC

## 2016-06-23 NOTE — Progress Notes (Signed)
[facility] Progress Note  Chief Complaint: Left-sided ulcerative colitis with diarrhea  Subjective History:  Kenneth Terrell follows up for his left-sided ulcerative colitis diagnosed 3 months ago. I initially prescribed Canasa and Uceris, his insurance would not cover them. I put him on a short course of prednisone and began Colazal 3 tablets 3 times a day. He felt much better on those medications, with resolution of diarrhea abdominal pain and rectal bleeding. However, he only took the Colazal for a month, and says he did not bring with him during a trip to [place] in early June. He reschedule his June 1 office visit because [place] was [place] to [place] [facility] [place]. He resumed the Colozal about 2 weeks ago but has not had improvement in symptoms yet. He is now back to having some intermittent lower abdominal cramps diarrhea, and some small-volume rectal bleeding a few days a week.  ROS: Cardiovascular:  no chest pain Respiratory: no dyspnea His appetite has been good and his weight stable.  The patient's Past Medical, Family and Social History were reviewed and are on file in the EMR.  Objective:  Med list reviewed  Vital signs in last 24 hrs: Filed Vitals:   06/23/16 1001  BP: 120/76  Pulse: 64    Physical Exam He is a muscular well-appearing young man  HEENT: sclera anicteric, oral mucosa moist without lesions  Neck: supple, no thyromegaly, JVD or lymphadenopathy  Cardiac: RRR without murmurs, S1S2 heard, no peripheral edema  Pulm: clear to auscultation bilaterally, normal RR and effort noted  Abdomen: soft, no tenderness, with active bowel sounds. No guarding or palpable hepatosplenomegaly.  Skin; warm and dry, no jaundice or rash  Recent Labs:  His biopsies were consistent with ulcerative colitis  @ASSESSMENTPLANBEGIN @ Assessment: Encounter Diagnoses  Name Primary?  . Other ulcerative colitis with rectal bleeding (HCC) Yes  . Diarrhea, unspecified type     Suboptimal symptom  control due to treatment noncompliance.  Plan: We gave him 6 days worth of Canasa suppository which he is agreeable to using nightly. Continue his current medication, but it is only until we can hopefully get approval for something that will be more effective such as Uceris or Lialda.  I also suspect his compliance, and therefore symptom control, will be much better with once daily medication. Follow-up in 3 months or sooner as needed  Total time 25 minutes, over half spent in counseling and coordination of care.  Topics discussed: Natural history of ulcerative colitis, the fact that it is a chronic condition requiring long-term medications with follow-up, and the need to call me if symptoms worsen. Kenneth Terrell

## 2016-06-23 NOTE — Patient Instructions (Signed)
If you are age 26 or older, your body mass index should be between 23-30. Your Body mass index is 28.41 kg/(m^2). If this is out of the aforementioned range listed, please consider follow up with your Primary Care Provider.  If you are age 364 or younger, your body mass index should be between 19-25. Your Body mass index is 28.41 kg/(m^2). If this is out of the aformentioned range listed, please consider follow up with your Primary Care Provider.   Please use the Canasa suppositories one by rectum each night.  We will work with your insurance company to approve Uceris.  Thank you for choosing [facility]  Dr Amada JupiterHenry Danis III

## 2016-06-26 ENCOUNTER — Telehealth: Payer: Self-pay | Admitting: Family Medicine

## 2016-06-26 ENCOUNTER — Ambulatory Visit (INDEPENDENT_AMBULATORY_CARE_PROVIDER_SITE_OTHER): Payer: BLUE CROSS/BLUE SHIELD | Admitting: Family Medicine

## 2016-06-26 ENCOUNTER — Encounter: Payer: Self-pay | Admitting: Family Medicine

## 2016-06-26 ENCOUNTER — Telehealth: Payer: Self-pay | Admitting: Gastroenterology

## 2016-06-26 VITALS — BP 118/73 | HR 50 | Temp 98.8°F | Ht 70.0 in | Wt 196.0 lb

## 2016-06-26 DIAGNOSIS — H5711 Ocular pain, right eye: Secondary | ICD-10-CM | POA: Diagnosis not present

## 2016-06-26 DIAGNOSIS — Z7689 Persons encountering health services in other specified circumstances: Secondary | ICD-10-CM

## 2016-06-26 NOTE — Telephone Encounter (Signed)
Pt is seeing Dr. Sarajane Jews at 1:30.

## 2016-06-26 NOTE — Telephone Encounter (Signed)
Patient Name: Kenneth Terrell  DOB: December 09, 1990    Initial Comment caller states his eye is irritated, red and sensitive to light   Nurse Assessment  Nurse: Verlin Fester RN, Stanton Kidney Date/Time (Eastern Time): 06/26/2016 9:36:29 AM  Confirm and document reason for call. If symptomatic, describe symptoms. You must click the next button to save text entered. ---Patient states his right eye is irritated, red and sensitive to light. States he wears contacts.  Has the patient traveled out of the country within the last 30 days? ---No  Does the patient have any new or worsening symptoms? ---Yes  Will a triage be completed? ---Yes  Related visit to physician within the last 2 weeks? ---No  Does the PT have any chronic conditions? (i.e. diabetes, asthma, etc.) ---No  Is this a behavioral health or substance abuse call? ---No     Guidelines    Guideline Title Affirmed Question Affirmed Notes  Eye - Red Without Pus Eye pain present > 24 hours    Final Disposition User   See Physician within 24 Hours [name], RN, [facility]    Referrals  REFERRED TO PCP OFFICE   Disagree/Comply: Leta Baptist

## 2016-06-26 NOTE — Progress Notes (Signed)
Pre visit review using our clinic review tool, if applicable. No additional management support is needed unless otherwise documented below in the visit note. 

## 2016-06-26 NOTE — Telephone Encounter (Signed)
Pt has been notified and aware of the new RX request. He states that the samples of Canasa are helping a lot.

## 2016-06-26 NOTE — Telephone Encounter (Signed)
[employer] denied the Uceris. New RX for Lialda 1.2 g 4 caps a day has been requested for a prior auth.. Will await to hear from insurance.

## 2016-06-26 NOTE — Progress Notes (Signed)
   Subjective:    Patient ID: Kenneth Terrell, male    DOB: 03/16/1991, 25 y.o.   MRN: [id number]  HPI Here for 4 days of pain and redness in the right eye.no recent trauma. He normally wears contacts but today he is not wearing the right contact. He has been very sensitive to light in the right eye. The left eye is fine. No URI or sinus symptoms. No discharge or matting in the eye.    Review of Systems  Constitutional: Negative.   HENT: Negative for postnasal drip, sinus pressure and sore throat.   Eyes: Positive for photophobia, pain and redness. Negative for discharge, itching and visual disturbance.  Respiratory: Negative.   Neurological: Negative.        Objective:   Physical Exam  Constitutional: He is oriented to person, place, and time. He appears well-developed and well-nourished.  In mild pain  HENT:  Head: Normocephalic and atraumatic.  Right Ear: External ear normal.  Left Ear: External ear normal.  Nose: Nose normal.  Mouth/Throat: Oropharynx is clear and moist.  Eyes: EOM are normal. Pupils are equal, round, and reactive to light. Right eye exhibits no discharge. Left eye exhibits no discharge.  Right cojunctiva is red, no DC is seen. He is very photophobic in the right eye, not the left   Neck: Normal range of motion. Neck supple. No thyromegaly present.  Pulmonary/Chest: Effort normal and breath sounds normal.  Lymphadenopathy:    He has no cervical adenopathy.  Neurological: He is alert and oriented to person, place, and time.          Assessment & Plan:  I am concerned about possible iritis or glaucoma in the eye, so we will arrange for him to see Ophthalmology this afternoon. Written out of work 06-23-16 unitl 07-01-16.  Nelwyn SalisburyFRY,Hadlie Gipson A, MD

## 2016-06-26 NOTE — Telephone Encounter (Signed)
Hopefully, his insurance will approve Lialda.  They previously denied canasa  Since he only has 6 days of canasa samples, please send a script for hydrocortisone 25 mg suppository, #10,no refill, if his insurance will approve.

## 2016-06-29 MED ORDER — HYDROCORTISONE ACETATE 25 MG RE SUPP: 25.0000 mg | Freq: Every day | RECTAL | 0 refills | Status: AC

## 2016-06-29 NOTE — Telephone Encounter (Signed)
Left message for pt to return call.

## 2016-06-29 NOTE — Telephone Encounter (Signed)
Lialda was also denied. Insurance states that he needs to try Delzicol. Rx for the Hydrocortisone was sent to the pharmacy. Please advise on the Delzicol.

## 2016-07-06 ENCOUNTER — Other Ambulatory Visit: Payer: Self-pay

## 2016-07-08 ENCOUNTER — Other Ambulatory Visit: Payer: Self-pay

## 2016-07-08 DIAGNOSIS — K51811 Other ulcerative colitis with rectal bleeding: Secondary | ICD-10-CM

## 2016-07-08 MED ORDER — MESALAMINE 400 MG PO CPDR: 1200.0000 mg | DELAYED_RELEASE_CAPSULE | Freq: Three times a day (TID) | ORAL | Status: DC

## 2016-07-08 NOTE — Telephone Encounter (Signed)
New RX for Delzicol 400mg  caps 3 caps three times a day #270 sent to pts pharmacy. Left a detailed voicemail to inform pt of medication update.

## 2016-07-09 ENCOUNTER — Ambulatory Visit: Payer: BLUE CROSS/BLUE SHIELD | Admitting: Family Medicine

## 2016-07-09 ENCOUNTER — Ambulatory Visit (INDEPENDENT_AMBULATORY_CARE_PROVIDER_SITE_OTHER): Payer: BLUE CROSS/BLUE SHIELD | Admitting: Family Medicine

## 2016-07-09 ENCOUNTER — Encounter: Payer: Self-pay | Admitting: Family Medicine

## 2016-07-09 VITALS — BP 100/62 | HR 62 | Temp 97.7°F | Ht 70.0 in | Wt 199.2 lb

## 2016-07-09 DIAGNOSIS — J029 Acute pharyngitis, unspecified: Secondary | ICD-10-CM | POA: Diagnosis not present

## 2016-07-09 LAB — POCT RAPID STREP A (OFFICE): Rapid Strep A Screen: NEGATIVE

## 2016-07-09 MED ORDER — MAGIC MOUTHWASH W/LIDOCAINE: 5.0000 mL | Freq: Three times a day (TID) | ORAL | 0 refills | Status: DC | PRN

## 2016-07-09 NOTE — Progress Notes (Signed)
   Subjective:    Patient ID: Kenneth Terrell, male    DOB: 01-09-1991, 25 y.o.   MRN: [id number]  Sore Throat   This is a new problem. The current episode started in the past 7 days (4-5 days). The problem has been gradually worsening. Neither side of throat is experiencing more pain than the other. There has been no fever. The pain is moderate. Associated symptoms include coughing, swollen glands and trouble swallowing. Pertinent negatives include no congestion, ear discharge, ear pain, headaches, plugged ear sensation, neck pain, shortness of breath or vomiting. Associated symptoms comments:   white mucus cough in last 24 hours sweats. He has had no exposure to strep or mono. Treatments tried: sinus med. The treatment provided no relief.    He also incidentally notes some " inflammation in shoulder and wrists x 1 week... Lifts weights, works as Administrator. Told to use ice and tyelnol, avoid heavy lifting and if not improving follow up for full eval.   Review of Systems  HENT: Positive for trouble swallowing. Negative for congestion, ear discharge and ear pain.   Respiratory: Positive for cough. Negative for shortness of breath.   Gastrointestinal: Negative for vomiting.  Musculoskeletal: Negative for neck pain.  Neurological: Negative for headaches.       Objective:   Physical Exam  Constitutional: Vital signs are normal. He appears well-developed and well-nourished.  Non-toxic appearance. He does not appear ill. No distress.  HENT:  Head: Normocephalic and atraumatic.  Right Ear: Hearing, tympanic membrane, external ear and ear canal normal. No tenderness. No foreign bodies. Tympanic membrane is not retracted and not bulging.  Left Ear: Hearing, tympanic membrane, external ear and ear canal normal. No tenderness. No foreign bodies. Tympanic membrane is not retracted and not bulging.  Nose: Nose normal. No mucosal edema or rhinorrhea. Right sinus exhibits no maxillary sinus tenderness  and no frontal sinus tenderness. Left sinus exhibits no maxillary sinus tenderness and no frontal sinus tenderness.  Mouth/Throat: Uvula is midline and mucous membranes are normal. Normal dentition. No dental caries. Posterior oropharyngeal erythema present. No oropharyngeal exudate, posterior oropharyngeal edema or tonsillar abscesses.  Small clear blisters in posterior oropharynx  Eyes: Conjunctivae, EOM and lids are normal. Pupils are equal, round, and reactive to light. Lids are everted and swept, no foreign bodies found.  Neck: Trachea normal, normal range of motion and phonation normal. Neck supple. Carotid bruit is not present. No thyroid mass and no thyromegaly present.  Cardiovascular: Normal rate, regular rhythm, S1 normal, S2 normal, normal heart sounds, intact distal pulses and normal pulses.  Exam reveals no gallop.   No murmur heard. Pulmonary/Chest: Effort normal and breath sounds normal. No respiratory distress. He has no wheezes. He has no rhonchi. He has no rales.  Abdominal: Soft. Normal appearance and bowel sounds are normal. There is no hepatosplenomegaly. There is no tenderness. There is no rebound, no guarding and no CVA tenderness. No hernia.  Neurological: He is alert. He has normal reflexes.  Skin: Skin is warm, dry and intact. No rash noted.  Psychiatric: He has a normal mood and affect. His speech is normal and behavior is normal. Judgment normal.          Assessment & Plan:

## 2016-07-09 NOTE — Patient Instructions (Signed)
No strep throat.. Likely viral pharyngitis.  Can use numbing gargle as needed for throat pain.  If more coughing develops.. Use mucinex DM twice daily for cough.  Use tylenol for pain in throat as needed as well.

## 2016-07-09 NOTE — Assessment & Plan Note (Signed)
Likely viral. Neg strep test.  Treat symptomatically. Reviewed viral timeline.

## 2016-07-09 NOTE — Progress Notes (Signed)
Pre visit review using our clinic review tool, if applicable. No additional management support is needed unless otherwise documented below in the visit note. 

## 2016-11-16 ENCOUNTER — Other Ambulatory Visit: Payer: Self-pay | Admitting: Gastroenterology

## 2016-11-16 NOTE — Telephone Encounter (Signed)
Please refill for one month with 2 refills.  Please make arrangements for office visit with me in next 2 months

## 2016-11-16 NOTE — Telephone Encounter (Signed)
Refill request for Asacol 1,200mg  3 x a day on Last seen in July 2017. No current follow up scheduled.

## 2016-12-24 ENCOUNTER — Ambulatory Visit: Payer: BLUE CROSS/BLUE SHIELD | Admitting: Gastroenterology

## 2016-12-28 ENCOUNTER — Encounter: Payer: Self-pay | Admitting: Family Medicine

## 2016-12-28 ENCOUNTER — Ambulatory Visit (INDEPENDENT_AMBULATORY_CARE_PROVIDER_SITE_OTHER): Payer: BLUE CROSS/BLUE SHIELD | Admitting: Family Medicine

## 2016-12-28 VITALS — BP 108/72 | HR 58 | Temp 97.9°F | Wt 193.0 lb

## 2016-12-28 DIAGNOSIS — K51919 Ulcerative colitis, unspecified with unspecified complications: Secondary | ICD-10-CM

## 2016-12-28 DIAGNOSIS — M79675 Pain in left toe(s): Secondary | ICD-10-CM | POA: Diagnosis not present

## 2016-12-28 MED ORDER — PREDNISONE 10 MG PO TABS: ORAL_TABLET | ORAL | 0 refills | Status: DC

## 2016-12-28 NOTE — Patient Instructions (Signed)
Please take prednisone with food as directed and follow up with your  GI provider as scheduled in one week or sooner if symptoms do not improve with treatment, worsen, or you develop new symptoms. Recommend that you establish care with a provider.   Ulcerative Colitis, Adult Ulcerative colitis is long-lasting (chronic) swelling (inflammation) of the large intestine (colon). Sores (ulcers) may also form on the colon. Ulcerative colitis is closely related to another condition of inflammation of the intestines that is called Crohn disease. Together, they are frequently referred to as inflammatory bowel disease (IBD). What are the causes? Ulcerative colitis is caused by increased activity of the immune system in the intestines. The immune system is the system that protects the body against harmful bacteria, viruses, fungi, and other things that can make you sick. When the immune system overacts, it causes inflammation. The cause of the increased immune system activity is not known. What increases the risk? Risk factors of ulcerative colitis include:  Age. This includes:  Being 6015-387 years old.  Being older than 27 years old.  Having a family history of ulcerative colitis.  Being of Jewish descent. What are the signs or symptoms? Common symptoms of ulcerative colitis include rectal bleeding and diarrhea. There is a wide range of symptoms, and a person's symptoms depend on how severe the condition is. Additional symptoms may include:  Pain or cramping in the belly (abdomen).  Fever.  Fatigue.  Weight loss.  Night sweats.  Rectal pain.  Feeling the immediate need to have a bowel movement.  Nausea.  Loss of appetite.  Anemia.  Joint pain or soreness.  Eye irritation.  Certain skin rashes. How is this diagnosed? Ulcerative colitis may be diagnosed by:  Medical history and physical exam.  Blood tests and stool tests.  X-rays.  CT scans.  Colonoscopy. For this test, a  flexible tube is inserted into your anus and your colon is examined.  Examination of a tissue sample from your colon (biopsy). How is this treated? Treatment for ulcerative colitis may include medicines to:  Decrease inflammation.  Control your immune system. Surgery may also be necessary. Follow these instructions at home: Medicines and vitamins  Take medicines only as directed by your doctor. Do not take aspirin.  Ask your doctor if you should take any vitamins or supplements. Lifestyle  Exercise regularly.  Limit alcohol intake to no more than 1 drink per day for nonpregnant women and 2 drinks per day for men. One drink equals 12 ounces of beer, 5 ounces of wine, or 1 ounces of hard liquor. Eating and drinking  Drink enough fluid to keep your urine clear or pale yellow.  Ask your health care provider about the best diet for you. Follow the diet as directed by your health care provider. This may include:  Avoiding carbonated drinks.  Avoiding popcorn, vegetable skins, nuts, and other high-fiber foods when you have symptoms of ulcerative colitis.  Eating smaller meals more often.  Keeping a food diary. This may help you to find and avoid any foods that make you feel not well.  Limit your caffeine intake. General instructions  Keep all follow-up appointments as directed by your health care provider. This is important. Contact a health care provider if:  Your symptoms do not improve or get worse with treatment.  You continue to lose weight.  You have constant cramps or loose bowels.  You develop a new skin rash, skin sores, or eye problems.  You have a fever  or chills. Get help right away if:  You have bloody diarrhea.  You have severe pain in your abdomen.  You vomit. This information is not intended to replace advice given to you by your health care provider. Make sure you discuss any questions you have with your health care provider. Document Released:  09/02/2005 Document Revised: 07/26/2016 Document Reviewed: 03/18/2015 Elsevier Interactive Patient Education  2017 [employer].

## 2016-12-28 NOTE — Progress Notes (Signed)
Subjective:    Patient ID: Kenneth Terrell, male    DOB: 12-Apr-1991, 26 y.o.   MRN: [id number]  HPI  Kenneth Terrell is a 26 year old male who presents today with abdominal cramping intermittently and reports of a flare of ulcerative colitis that has been present for approximately one month.  He reports abdominal cramping, diarrhea, and some reports of small amounts of blood in stool.  He reports that he has noticed this onset with a recent change in his diet. He is followed by GI and he has an appointment in 8 days.  He contacted his GI provider by phone who suggested that he be seen prior to this appointment if symptoms are continuing to be a concern. He is currently taking colazal TID which has provided limited benefit.  He denies fever, chills, sweats, N/V, anorexia, weight loss, chest pain, dysuria.  Fifth toe on left foot is bruised and painful following bumping into a wall that has been present for over two weeks.Marland Kitchen  He denies difficulty with ambulation or moving his toe. He reports pain that is noted as an ache when he applies pressure or wears another shoe besides his tennis shoes. Treatment with ice has provided limited benefit.   Aggravating factors of change in diet from recent plant based diet. He reports that recent use of tumeric has provided benefit for him.  Review of Systems  Constitutional: Negative for chills, fatigue, fever and unexpected weight change.  Respiratory: Negative for cough, shortness of breath and wheezing.   Cardiovascular: Negative for chest pain and palpitations.  Gastrointestinal: Positive for blood in stool and diarrhea. Negative for abdominal pain, nausea, rectal pain and vomiting.  Genitourinary: Negative for dysuria and hematuria.  Musculoskeletal: Negative for myalgias.  Neurological: Negative for dizziness, light-headedness and headaches.   Past Medical History:  Diagnosis Date  . ADHD 01/11/2008   3 YOA   . BIPOLAR DISORDER UNSPECIFIED 01/11/2008   actually thought more depression - ?misdiagnosis  . History of chlamydia 2008   treated  . Ulcerative colitis ([facility]) 03/2016   by colonoscopy     Social History   Social History  . Marital status: Single    Spouse name: N/A  . Number of children: N/A  . Years of education: N/A   Occupational History  . Student Unemployed    Going to [employer]   Social History Main Topics  . Smoking status: Never Smoker  . Smokeless tobacco: Never Used     Comment: quit 2011  . Alcohol use No  . Drug use: No  . Sexual activity: Yes    Partners: Female   Other Topics Concern  . Not on file   Social History Narrative   Lives with mother   Dad is in [place]//does not see on a regular basis   Hobbies; BB, FB   Graduated from [employer], [place], wants to be certified personal trainer - to go to school in [place]   Activity: gym 6d/wk, T25   Diet: lots of sweets, good water, fruits/vegetables seldom    Past Surgical History:  Procedure Laterality Date  . [facility] Admission  1999   x 1 week  . COLONOSCOPY  03/2016   Ulcerative colitis by biopsy ([facility])  . EXTERNAL EAR SURGERY    . KELOID EXCISION  10/2007   left ear (Dr. Towanda Malkin)    Family History  Problem Relation Age of Onset  . Drug abuse Father   . Diabetes Maternal Uncle   .  Cancer Maternal Grandmother     lung cancer met. to brain  . Depression Maternal Grandmother   . Stroke Maternal Grandmother   . Colon cancer Maternal Grandmother   . CAD Other     MI; angina  . Cancer Other     colon    No Known Allergies  Current Outpatient Prescriptions on File Prior to Visit  Medication Sig Dispense Refill  . balsalazide (COLAZAL) 750 MG capsule TAKE 3 CAPSULES BY MOUTH 3 TIMES DAILY 270 capsule 1  . Ginkgo Biloba 40 MG TABS Take by mouth.    . magic mouthwash w/lidocaine SOLN Take 5 mLs by mouth 3 (three) times daily as needed for mouth pain (gargle in throat then spit). (Patient not taking: Reported on 12/28/2016) 50 mL 0  .  Multiple Vitamin (MULTIVITAMIN) tablet Take 1 tablet by mouth daily.    . protein supplement (UNJURY VANILLA) POWD Take 2 oz by mouth 4 (four) times daily. Reported on 04/02/2016     Current Facility-Administered Medications on File Prior to Visit  Medication Dose Route Frequency Provider Last Rate Last Dose  . Mesalamine (ASACOL) DR capsule 1,200 mg  1,200 mg Oral TID Nelida Meuse III, MD        BP 108/72 (BP Location: Left Arm, Patient Position: Sitting, Cuff Size: Normal)   Pulse (!) 58   Temp 97.9 F (36.6 C) (Oral)   Wt 193 lb (87.5 kg)   SpO2 98%   BMI 27.69 kg/m        Objective:   Physical Exam  Constitutional: He is oriented to person, place, and time. He appears well-developed and well-nourished.  Eyes: Pupils are equal, round, and reactive to light. No scleral icterus.  Neck: Neck supple.  Cardiovascular: Normal rate and regular rhythm.   Pulmonary/Chest: Effort normal and breath sounds normal. He has no wheezes. He has no rales.  Abdominal: Soft. Bowel sounds are normal. He exhibits no distension. There is no tenderness.  Musculoskeletal: He exhibits no edema.  Mild ecchymosis on left fifth toe without edema. Full ROM, no pain with movement, only discomfort noted when wearing shoes that are not his tennis shoes.   Lymphadenopathy:    He has no cervical adenopathy.  Neurological: He is alert and oriented to person, place, and time.  Skin: Skin is warm and dry. No rash noted.  Psychiatric: He has a normal mood and affect. His behavior is normal. Judgment and thought content normal.       Assessment & Plan:  1. Ulcerative colitis with complication, unspecified location [facility]) Suspect flare of symptoms; provide prednisone and follow up with GI provider in one week or sooner if needed. Patient reports that he has an appointment with GI in 8 days. Will provide prednisone taper that was given by his GI provider and worked well for him previously. - predniSONE (DELTASONE)  10 MG tablet; Take 4 tablets once daily for 5 days, 3 tablets once daily for 5 days, 2 tablets once daily for 5 days, one tablet once daily for 5 days, half tablet once daily for 4 days, then stop  Dispense: 55 tablet; Refill: 0  Advised water to keep urine pale yellow or clear, and avoid food triggers that he noted with this flare. Keep appointment with GI provider for follow up in one week. Recommended that he establish care with a provider in this office since he reports recently moving to this area and he would like to continue to be seen in  this clinic. Patient voiced understanding and agreed with plan.  2. Pain of toe of left foot Suspect that pain is related to recent trauma of bumping his toe into the wall. Suspect that this will continue to resolve. No concerning findings on exam. Advised that he follow up for further evaluation if discomfort does not continue to improve.  Delano Metz, FNP-C

## 2016-12-28 NOTE — Progress Notes (Signed)
Pre visit review using our clinic review tool, if applicable. No additional management support is needed unless otherwise documented below in the visit note. 

## 2016-12-30 ENCOUNTER — Telehealth: Payer: Self-pay | Admitting: Family Medicine

## 2016-12-30 NOTE — Telephone Encounter (Signed)
° ° °  Pt call to say he is taking an over the counter vitamin Ultimate 10 probiotic and is asking if this will be ok while he is taking predisone for his flair ups. Would like a call back  He said he called here because he saw Almyra Free on 12/28/16

## 2016-12-30 NOTE — Telephone Encounter (Signed)
Probiotic can be taken with prednisone. Recommend that he follow up as schedule with his GI provider

## 2016-12-31 NOTE — Telephone Encounter (Signed)
Spoke to patient verbalized understanding that its okey to take a probiotic with current prescription of prednisone, patient was advised to follow up with his GI provider per Gregary Signs.

## 2017-01-05 ENCOUNTER — Ambulatory Visit (INDEPENDENT_AMBULATORY_CARE_PROVIDER_SITE_OTHER): Payer: BLUE CROSS/BLUE SHIELD | Admitting: Gastroenterology

## 2017-01-05 ENCOUNTER — Encounter: Payer: Self-pay | Admitting: Gastroenterology

## 2017-01-05 VITALS — BP 110/66 | HR 66 | Ht 70.0 in | Wt 197.0 lb

## 2017-01-05 DIAGNOSIS — K51811 Other ulcerative colitis with rectal bleeding: Secondary | ICD-10-CM

## 2017-01-05 MED ORDER — MESALAMINE 400 MG PO CPDR: DELAYED_RELEASE_CAPSULE | ORAL | 3 refills | Status: DC

## 2017-01-05 NOTE — Patient Instructions (Signed)
Follow up in 3 months.  New RX for Asacol sent to the pharmacy.  Thank you for choosing [facility]  Dr Amada JupiterHenry Danis III

## 2017-01-05 NOTE — Progress Notes (Signed)
[facility] Progress Note  Chief Complaint: Left-sided ulcerative colitis  Subjective  History:  This is Kenneth Terrell's first office visit with me since July 2017. He has left-sided ulcerative colitis, and his insurance formulary has made it difficult to obtain some of the medicines week prescribed. He was unable to take Uceris or Lialda. He had been on Colazal without much improvement. I prescribed him Delzicol in August, he was left a message to pick it up at the pharmacy, but he admits that he did not do so. Compliance has been an issue with him since his diagnosis last April. He saw primary care last week because he had had 1-2 months of increasing loose stool with some intermittent bleeding and left-sided crampy abdominal pain. They put him on a tapering course of prednisone.  ROS: Cardiovascular:  no chest pain Respiratory: no dyspnea He has no arthralgias, eye redness or pain or skin rash The patient's Past Medical, Family and Social History were reviewed and are on file in the EMR.  Objective:  Med list reviewed  Vital signs in last 24 hrs: Vitals:   01/05/17 1412  BP: 110/66  Pulse: 66    Physical Exam    HEENT: sclera anicteric, oral mucosa moist without lesions  Neck: supple, no thyromegaly, JVD or lymphadenopathy  Cardiac: RRR without murmurs, S1S2 heard, no peripheral edema  Pulm: clear to auscultation bilaterally, normal RR and effort noted  Abdomen: soft, No tenderness, with active bowel sounds. No guarding or palpable hepatosplenomegaly.  Skin; warm and dry, no jaundice or rash  No recent labs or imaging  @ASSESSMENTPLANBEGIN @ Assessment: Encounter Diagnosis  Name Primary?  . Other ulcerative colitis with rectal bleeding ([place]) Yes    His left sided colitis has been difficult to control due to his restrictive medication formulary and his noncompliance. As before, he is somewhat inattentive during the visit while on his phone, and asked me to talk to his  mother to explain the issue of insurance formulary.   Plan: Delzicol 400 mg, 4 capsules 3 times a day for a total of 4800 mg daily. If that is not helpful, perhaps his insurance will allow Lialda instead. I think Uceris would be ideal, but appears to be cost prohibitive with his current formulary. I also discussed the possibility of that medicine in a rectal foam, but it is also known to be expensive. Follow-up in 2 months  Total time 30 minutes, over half spent in counseling and coordination of care.   Nelida Meuse III

## 2017-01-18 ENCOUNTER — Other Ambulatory Visit: Payer: Self-pay | Admitting: Gastroenterology

## 2017-01-18 ENCOUNTER — Other Ambulatory Visit: Payer: Self-pay

## 2017-01-18 MED ORDER — BUDESONIDE 9 MG PO TB24: 9.0000 mg | ORAL_TABLET | Freq: Every day | ORAL | 6 refills | Status: DC

## 2017-01-19 ENCOUNTER — Encounter: Payer: Self-pay | Admitting: Internal Medicine

## 2017-01-19 ENCOUNTER — Ambulatory Visit (INDEPENDENT_AMBULATORY_CARE_PROVIDER_SITE_OTHER): Payer: BLUE CROSS/BLUE SHIELD | Admitting: Internal Medicine

## 2017-01-19 ENCOUNTER — Ambulatory Visit (INDEPENDENT_AMBULATORY_CARE_PROVIDER_SITE_OTHER)
Admission: RE | Admit: 2017-01-19 | Discharge: 2017-01-19 | Disposition: A | Discharge status: Home / Self Care | Payer: BLUE CROSS/BLUE SHIELD | Source: Ambulatory Visit | Attending: Internal Medicine | Admitting: Internal Medicine

## 2017-01-19 VITALS — BP 122/70 | HR 64 | Temp 98.2°F | Ht 70.0 in | Wt 195.6 lb

## 2017-01-19 DIAGNOSIS — M79672 Pain in left foot: Secondary | ICD-10-CM

## 2017-01-19 NOTE — Progress Notes (Signed)
Subjective:    Patient ID: Kenneth Terrell, male    DOB: 1991-01-27, 26 y.o.   MRN: [id number]  HPI  26 year old patient who traumatized his left lateral foot approximately 4 weeks ago when he awoke during the night to use the restroom.  He has had persistent pain involving especially the left fifth toe. Pain has increased over the past few days.  Past Medical History:  Diagnosis Date  . ADHD 01/11/2008   3 YOA   . BIPOLAR DISORDER UNSPECIFIED 01/11/2008   actually thought more depression - ?misdiagnosis  . History of chlamydia 2008   treated  . Ulcerative colitis ([place]) 03/2016   by colonoscopy     Social History   Social History  . Marital status: Single    Spouse name: N/A  . Number of children: N/A  . Years of education: N/A   Occupational History  . Student Unemployed    Going to [employer]   Social History Main Topics  . Smoking status: Never Smoker  . Smokeless tobacco: Never Used     Comment: quit 2011  . Alcohol use No  . Drug use: No  . Sexual activity: Yes    Partners: Female   Other Topics Concern  . Not on file   Social History Narrative   Lives with mother   Dad is in [place]//does not see on a regular basis   Hobbies; BB, FB   Graduated from [employer], [place], wants to be certified personal trainer - to go to school in [place]   Activity: gym 6d/wk, T25   Diet: lots of sweets, good water, fruits/vegetables seldom    Past Surgical History:  Procedure Laterality Date  . [facility] Admission  1999   x 1 week  . COLONOSCOPY  03/2016   Ulcerative colitis by biopsy ([facility])  . EXTERNAL EAR SURGERY    . KELOID EXCISION  10/2007   left ear (Dr. Towanda Malkin)    Family History  Problem Relation Age of Onset  . Drug abuse Father   . CAD Other     MI; angina  . Cancer Other     colon  . Diabetes Maternal Uncle   . Cancer Maternal Grandmother     lung cancer met. to brain  . Depression Maternal Grandmother   . Stroke Maternal Grandmother   . Colon cancer  Maternal Grandmother     No Known Allergies  Current Outpatient Prescriptions on File Prior to Visit  Medication Sig Dispense Refill  . Multiple Vitamin (MULTIVITAMIN) tablet Take 1 tablet by mouth daily.     No current facility-administered medications on file prior to visit.     BP 122/70 (BP Location: Right Arm, Patient Position: Sitting, Cuff Size: Normal)   Pulse 64   Temp 98.2 F (36.8 C) (Oral)   Ht '5\' 10"'  (1.778 m)   Wt 195 lb 9.6 oz (88.7 kg)   SpO2 99%   BMI 28.07 kg/m     Review of Systems  Musculoskeletal: Positive for arthralgias and gait problem.       Left fifth toe and lateral foot pain       Objective:   Physical Exam  Constitutional: He appears well-developed and well-nourished. No distress.  Musculoskeletal:  Mild tenderness of the left fifth toe with some soft tissue swelling No ecchymoses Tenderness also involving the left distal fifth metatarsal          Assessment & Plan:  Left foot pain.  Posttraumatic Rule out  fracture, left fifth toe, left fifth tarsal.  Will review a x-ray.  Nyoka Cowden

## 2017-01-19 NOTE — Progress Notes (Signed)
Pre visit review using our clinic review tool, if applicable. No additional management support is needed unless otherwise documented below in the visit note. 

## 2017-01-19 NOTE — Patient Instructions (Signed)
You  may move around, but avoid painful motions and activities.   X-ray left foot as discussed

## 2017-01-20 ENCOUNTER — Encounter: Payer: Self-pay | Admitting: Internal Medicine

## 2017-01-20 NOTE — Addendum Note (Signed)
Addended by: Abelardo Diesel on: 01/20/2017 11:40 AM   Modules accepted: Orders

## 2017-01-21 ENCOUNTER — Ambulatory Visit (INDEPENDENT_AMBULATORY_CARE_PROVIDER_SITE_OTHER): Payer: BLUE CROSS/BLUE SHIELD | Admitting: Orthopedic Surgery

## 2017-01-21 ENCOUNTER — Encounter (INDEPENDENT_AMBULATORY_CARE_PROVIDER_SITE_OTHER): Payer: Self-pay | Admitting: Orthopedic Surgery

## 2017-01-21 VITALS — Ht 70.0 in | Wt 195.0 lb

## 2017-01-21 DIAGNOSIS — M6702 Short Achilles tendon (acquired), left ankle: Secondary | ICD-10-CM | POA: Diagnosis not present

## 2017-01-21 DIAGNOSIS — M79675 Pain in left toe(s): Secondary | ICD-10-CM

## 2017-01-21 DIAGNOSIS — M1A0720 Idiopathic chronic gout, left ankle and foot, without tophus (tophi): Secondary | ICD-10-CM

## 2017-01-21 NOTE — Progress Notes (Signed)
Office Visit Note   Patient: Kenneth Terrell           Date of Birth: 1991-04-07           MRN: [id number] Visit Date: 01/21/2017              Requested by: [name], MD [address] [place],  [address] PCP: [name], MD  Chief Complaint  Patient presents with  . Left Foot - Pain    HPI: Patient is a 26 y.o male who presents today for left foot pain. Patients states he hit it on the wall approximately one month ago. He complains of persistent swelling and pain. He is unable to wear regular shoes due to pain and unable to work. Patient complains of pain left little toe. He had xrays done by his medical doctor 01/19/17. 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 He states after removing his shoe wear the lateral side of his foot has purple discoloration. Maxcine Ham, RT    Assessment & Plan: Visit Diagnoses:  1. Pain in left toe(s)   2. Achilles tendon contracture, left   3. Chronic idiopathic gout involving toe of left foot without tophus     Plan: Patient was given instructions for heel cord stretching demonstrated heel cord stretching 5 times a day minute at a time uric acid level drawn today. We will call him with results.  Follow-Up Instructions: Return if symptoms worsen or fail to improve.   Ortho Exam Patient is alert oriented no adenopathy well-dressed normal affect normal respiratory effort.  Patient has a normal gait. He has good pulses. He has good ankle and subtalar motion no evidence of talar bar. Patient is tender to palpation of the fifth metatarsal head he does have swelling there is no redness. There is also swelling of the PIP joint of the left little toe which is also tender. X-rays are reviewed. Patient has a bone island in the proximal phalanx of the third toe. Patient has a cystic lesion fifth metatarsal head most likely consistent with gout. His remaining toe joint spaces are symmetric. Patient does have some  bony spurs of the anterior aspect of the tibial talar joint but is asymptomatic.  Imaging: No results found.  Orders:  Orders Placed This Encounter  Procedures  . Uric acid   No orders of the defined types were placed in this encounter.    Procedures: No procedures performed  Clinical Data: No additional findings.  Subjective: Review of Systems  Objective: Vital Signs: Ht 5' 10"  (1.778 m)   Wt 195 lb (88.5 kg)   BMI 27.98 kg/m   Specialty Comments:  No specialty comments available.  PMFS History: Patient Active Problem List   Diagnosis Date Noted  . Achilles tendon contracture, left 01/21/2017  . 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 Acute pharyngitis 07/09/2016  . Ulcerative colitis ([place]) 03/07/2016  . Left low back pain 09/27/2015  . Spasm of lumbar paraspinous muscle 09/11/2015  . Injury of left rotator cuff 08/27/2015  . Right knee injury 02/06/2015  . Genital warts 12/26/2013  . Healthcare maintenance 01/16/2013  . Molluscum contagiosum 01/16/2013  . Gynecomastia 10/19/2012  . Lower back pain 10/11/2012  . BIPOLAR DISORDER UNSPECIFIED 01/11/2008  . Attention deficit hyperactivity disorder (ADHD) 01/11/2008   Past Medical History:  Diagnosis Date  . ADHD 01/11/2008   3 YOA   . BIPOLAR DISORDER UNSPECIFIED 01/11/2008  actually thought more depression - ?misdiagnosis  . History of chlamydia 2008   treated  . Ulcerative colitis ([place]) 03/2016   by colonoscopy    Family History  Problem Relation Age of Onset  . Drug abuse Father   . CAD Other     MI; angina  . Cancer Other     colon  . Diabetes Maternal Uncle   . Cancer Maternal Grandmother     lung cancer met. to brain  . Depression Maternal Grandmother   . Stroke Maternal Grandmother   . Colon cancer Maternal Grandmother     Past Surgical History:  Procedure Laterality Date  . [facility] Admission  1999   x 1 week  . COLONOSCOPY  03/2016   Ulcerative  colitis by biopsy ([facility])  . EXTERNAL EAR SURGERY    . KELOID EXCISION  10/2007   left ear (Dr. Towanda Malkin)   Social History   Occupational History  . Student Unemployed    Going to [employer]   Social History Main Topics  . Smoking status: Never Smoker  . Smokeless tobacco: Never Used     Comment: quit 2011  . Alcohol use No  . Drug use: No  . Sexual activity: Yes    Partners: Female

## 2017-01-22 LAB — URIC ACID: Uric Acid, Serum: 3.8 mg/dL — ABNORMAL LOW (ref 4.0–8.0)

## 2017-01-22 NOTE — Progress Notes (Signed)
Called pt to advise of results will call with questions.

## 2017-02-02 ENCOUNTER — Telehealth: Payer: Self-pay | Admitting: Family Medicine

## 2017-02-02 DIAGNOSIS — K51811 Other ulcerative colitis with rectal bleeding: Secondary | ICD-10-CM

## 2017-02-02 NOTE — Telephone Encounter (Signed)
Referral placed.

## 2017-02-02 NOTE — Telephone Encounter (Signed)
Pt would like referral to [facility] for second opinion.   cb number is [phone]

## 2017-02-03 NOTE — Telephone Encounter (Signed)
Spoke with Rodena Piety @ [facility]  The 2nd opinion has to come from patients GI dr.  Abbott Pao is aware and he will call them to get 2nd opinion referral for [facility] 2/28/rbh

## 2017-02-12 ENCOUNTER — Telehealth: Payer: Self-pay

## 2017-02-12 ENCOUNTER — Telehealth: Payer: Self-pay | Admitting: Gastroenterology

## 2017-02-12 NOTE — Telephone Encounter (Signed)
Patient states he is not really any better. After reading last office note, he was supposed to follow up here in 2 months. Offered to make him an appointment, but he would like to see if [facility] has anything else to offer. Patient would like a referral from our office for a second opinion to [facility].

## 2017-02-12 NOTE — Telephone Encounter (Signed)
As he wishes.  I do not know any particular faculty IBD doctors there, so I imagine a referral to [facility] department for ulcerative colitis will be given to an IBD specialist.  Would have to send all my office notes and colonoscopy report.

## 2017-02-12 NOTE — Telephone Encounter (Signed)
Faxed our records for second opinion to [facility]. Patient advised.

## 2017-02-19 ENCOUNTER — Ambulatory Visit: Payer: Self-pay | Admitting: Family Medicine

## 2017-02-19 ENCOUNTER — Encounter (HOSPITAL_COMMUNITY): Payer: Self-pay

## 2017-02-19 ENCOUNTER — Telehealth: Payer: Self-pay | Admitting: Family Medicine

## 2017-02-19 ENCOUNTER — Inpatient Hospital Stay (HOSPITAL_COMMUNITY)
Admission: EM | Admit: 2017-02-19 | Discharge: 2017-02-23 | DRG: 386 | Disposition: A | Discharge status: Home / Self Care | Payer: BLUE CROSS/BLUE SHIELD | Attending: Family Medicine | Admitting: Family Medicine

## 2017-02-19 ENCOUNTER — Telehealth: Payer: Self-pay | Admitting: Surgical

## 2017-02-19 DIAGNOSIS — R42 Dizziness and giddiness: Secondary | ICD-10-CM | POA: Diagnosis present

## 2017-02-19 DIAGNOSIS — Z801 Family history of malignant neoplasm of trachea, bronchus and lung: Secondary | ICD-10-CM

## 2017-02-19 DIAGNOSIS — K51311 Ulcerative (chronic) rectosigmoiditis with rectal bleeding: Secondary | ICD-10-CM | POA: Diagnosis not present

## 2017-02-19 DIAGNOSIS — Z9114 Patient's other noncompliance with medication regimen: Secondary | ICD-10-CM | POA: Diagnosis not present

## 2017-02-19 DIAGNOSIS — R51 Headache: Secondary | ICD-10-CM | POA: Diagnosis present

## 2017-02-19 DIAGNOSIS — Z9119 Patient's noncompliance with other medical treatment and regimen: Secondary | ICD-10-CM

## 2017-02-19 DIAGNOSIS — K922 Gastrointestinal hemorrhage, unspecified: Secondary | ICD-10-CM

## 2017-02-19 DIAGNOSIS — K51911 Ulcerative colitis, unspecified with rectal bleeding: Secondary | ICD-10-CM | POA: Diagnosis not present

## 2017-02-19 DIAGNOSIS — K219 Gastro-esophageal reflux disease without esophagitis: Secondary | ICD-10-CM | POA: Diagnosis present

## 2017-02-19 DIAGNOSIS — Z79899 Other long term (current) drug therapy: Secondary | ICD-10-CM | POA: Diagnosis not present

## 2017-02-19 DIAGNOSIS — Z800 Family history of malignant neoplasm of digestive organs: Secondary | ICD-10-CM

## 2017-02-19 DIAGNOSIS — K649 Unspecified hemorrhoids: Secondary | ICD-10-CM | POA: Diagnosis present

## 2017-02-19 DIAGNOSIS — Z833 Family history of diabetes mellitus: Secondary | ICD-10-CM

## 2017-02-19 DIAGNOSIS — Z8249 Family history of ischemic heart disease and other diseases of the circulatory system: Secondary | ICD-10-CM | POA: Diagnosis not present

## 2017-02-19 DIAGNOSIS — Z823 Family history of stroke: Secondary | ICD-10-CM | POA: Diagnosis not present

## 2017-02-19 DIAGNOSIS — Z818 Family history of other mental and behavioral disorders: Secondary | ICD-10-CM | POA: Diagnosis not present

## 2017-02-19 DIAGNOSIS — R001 Bradycardia, unspecified: Secondary | ICD-10-CM | POA: Diagnosis not present

## 2017-02-19 DIAGNOSIS — F319 Bipolar disorder, unspecified: Secondary | ICD-10-CM | POA: Diagnosis present

## 2017-02-19 DIAGNOSIS — K5190 Ulcerative colitis, unspecified, without complications: Secondary | ICD-10-CM | POA: Diagnosis present

## 2017-02-19 DIAGNOSIS — F909 Attention-deficit hyperactivity disorder, unspecified type: Secondary | ICD-10-CM | POA: Diagnosis present

## 2017-02-19 DIAGNOSIS — N179 Acute kidney failure, unspecified: Secondary | ICD-10-CM | POA: Diagnosis present

## 2017-02-19 DIAGNOSIS — K51511 Left sided colitis with rectal bleeding: Principal | ICD-10-CM | POA: Diagnosis present

## 2017-02-19 DIAGNOSIS — D500 Iron deficiency anemia secondary to blood loss (chronic): Secondary | ICD-10-CM | POA: Diagnosis present

## 2017-02-19 DIAGNOSIS — G4710 Hypersomnia, unspecified: Secondary | ICD-10-CM | POA: Diagnosis present

## 2017-02-19 LAB — CBC
HCT: 21 % — ABNORMAL LOW (ref 39.0–52.0)
Hemoglobin: 6.3 g/dL — CL (ref 13.0–17.0)
MCH: 20.4 pg — ABNORMAL LOW (ref 26.0–34.0)
MCHC: 30 g/dL (ref 30.0–36.0)
MCV: 68 fL — ABNORMAL LOW (ref 78.0–100.0)
Platelets: 527 10*3/uL — ABNORMAL HIGH (ref 150–400)
RBC: 3.09 MIL/uL — ABNORMAL LOW (ref 4.22–5.81)
RDW: 15.8 % — ABNORMAL HIGH (ref 11.5–15.5)
WBC: 8.3 10*3/uL (ref 4.0–10.5)

## 2017-02-19 LAB — URINALYSIS, ROUTINE W REFLEX MICROSCOPIC
Bacteria, UA: NONE SEEN
Bilirubin Urine: NEGATIVE
Glucose, UA: NEGATIVE mg/dL
Hgb urine dipstick: NEGATIVE
Ketones, ur: NEGATIVE mg/dL
Nitrite: NEGATIVE
Protein, ur: NEGATIVE mg/dL
Specific Gravity, Urine: 1.009 (ref 1.005–1.030)
Squamous Epithelial / LPF: NONE SEEN
pH: 6 (ref 5.0–8.0)

## 2017-02-19 LAB — BASIC METABOLIC PANEL
Anion gap: 5 (ref 5–15)
BUN: 15 mg/dL (ref 6–20)
CO2: 27 mmol/L (ref 22–32)
Calcium: 9 mg/dL (ref 8.9–10.3)
Chloride: 104 mmol/L (ref 101–111)
Creatinine, Ser: 1.37 mg/dL — ABNORMAL HIGH (ref 0.61–1.24)
GFR calc Af Amer: >60 mL/min (ref >60)
GFR calc non Af Amer: >60 mL/min (ref >60)
Glucose, Bld: 101 mg/dL — ABNORMAL HIGH (ref 65–99)
Potassium: 4.6 mmol/L (ref 3.5–5.1)
Sodium: 136 mmol/L (ref 135–145)

## 2017-02-19 LAB — PREPARE RBC (CROSSMATCH)

## 2017-02-19 LAB — CBG MONITORING, ED: Glucose-Capillary: 83 mg/dL (ref 65–99)

## 2017-02-19 LAB — ABO/RH: ABO/RH(D): B POS

## 2017-02-19 MED ORDER — SODIUM CHLORIDE 0.9 % IV SOLN
10.0000 mL/h | Freq: Once | INTRAVENOUS | Status: DC
  Administered 2017-02-19: 10 mL/h via INTRAVENOUS

## 2017-02-19 MED ORDER — ONDANSETRON HCL 4 MG/2ML IJ SOLN: 4.0000 mg | Freq: Four times a day (QID) | INTRAMUSCULAR | Status: DC | PRN

## 2017-02-19 MED ORDER — FAMOTIDINE IN NACL 20-0.9 MG/50ML-% IV SOLN
20.0000 mg | Freq: Two times a day (BID) | INTRAVENOUS | Status: DC
  Administered 2017-02-19 – 2017-02-20 (×2): 20 mg via INTRAVENOUS
  Filled 2017-02-19 (×3): qty 50

## 2017-02-19 MED ORDER — SODIUM CHLORIDE 0.9 % IV SOLN
INTRAVENOUS | Status: DC
  Administered 2017-02-19 – 2017-02-20 (×2): via INTRAVENOUS

## 2017-02-19 MED ORDER — ONDANSETRON HCL 4 MG PO TABS: 4.0000 mg | ORAL_TABLET | Freq: Four times a day (QID) | ORAL | Status: DC | PRN

## 2017-02-19 MED ORDER — SODIUM CHLORIDE 0.9% FLUSH
3.0000 mL | Freq: Two times a day (BID) | INTRAVENOUS | Status: DC
Start: 2017-02-19 — End: 2017-02-23
  Administered 2017-02-19 – 2017-02-23 (×7): 3 mL via INTRAVENOUS

## 2017-02-19 NOTE — Telephone Encounter (Signed)
Pt at [facility] right now

## 2017-02-19 NOTE — Telephone Encounter (Signed)
After speaking with Eunice BlaseDebbie about patient she said that patient needs to go to the ED. I spoke with patient and he agreed that he would go to ER to be evaluated.

## 2017-02-19 NOTE — ED Triage Notes (Signed)
Pt states he has been having headaches.  When he has the headache he has episodes of dizziness.  Pt has ulcerative colitis and has flares of diarrhea.  No fever.

## 2017-02-19 NOTE — ED Provider Notes (Signed)
[facility] DEPT Provider Note   CSN: [id number] Arrival date & time: 02/19/17  1516     History   Chief Complaint Chief Complaint  Patient presents with  . Headache  . Dizziness    HPI Kenneth Terrell is a 26 y.o. male.  26 yo M with a chief complaint of headache and weakness. Going on for the past couple weeks. Patient saw his family physician who had labs drawn and found that his hemoglobin was in the sixes. Patient denies any increased lower GI bleeding. He does have ulcerative colitis and has had bouts of bright red stool mixed with dark stool. He also has had periods of brown stool. His last bowel movement was about an hour ago which was brown. He denies any fevers denies any abdominal pain. He has not been taking his home medications. He had insurance approval for Uceris, but has not started it yet.    The history is provided by the patient.  Headache   Pertinent negatives include no fever, no palpitations, no shortness of breath and no vomiting.  Dizziness  Associated symptoms: blood in stool   Associated symptoms: no chest pain, no diarrhea, no headaches, no palpitations, no shortness of breath and no vomiting   Illness  This is a new problem. The current episode started more than 2 days ago. The problem occurs constantly. The problem has not changed since onset.Pertinent negatives include no chest pain, no abdominal pain, no headaches and no shortness of breath. Nothing aggravates the symptoms. Nothing relieves the symptoms. He has tried nothing for the symptoms. The treatment provided no relief.    Past Medical History:  Diagnosis Date  . ADHD 01/11/2008   3 YOA   . BIPOLAR DISORDER UNSPECIFIED 01/11/2008   actually thought more depression - ?misdiagnosis  . History of chlamydia 2008   treated  . Ulcerative colitis ([place]) 03/2016   by colonoscopy    Patient Active Problem List   Diagnosis Date Noted  . Achilles tendon contracture, left 01/21/2017  . Chronic  idiopathic gout involving toe of left foot without tophus 01/21/2017  . Acute pharyngitis 07/09/2016  . Ulcerative colitis ([place]) 03/07/2016  . Left low back pain 09/27/2015  . Spasm of lumbar paraspinous muscle 09/11/2015  . Injury of left rotator cuff 08/27/2015  . Right knee injury 02/06/2015  . Genital warts 12/26/2013  . Healthcare maintenance 01/16/2013  . Molluscum contagiosum 01/16/2013  . Gynecomastia 10/19/2012  . Lower back pain 10/11/2012  . BIPOLAR DISORDER UNSPECIFIED 01/11/2008  . Attention deficit hyperactivity disorder (ADHD) 01/11/2008    Past Surgical History:  Procedure Laterality Date  . [facility] Admission  1999   x 1 week  . COLONOSCOPY  03/2016   Ulcerative colitis by biopsy ([facility])  . EXTERNAL EAR SURGERY    . KELOID EXCISION  10/2007   left ear (Dr. Towanda Malkin)       Home Medications    Prior to Admission medications   Medication Sig Start Date End Date Taking? Authorizing Provider  Budesonide (UCERIS) 9 MG TB24 Take 9 mg by mouth daily.   Yes Historical Provider, MD  COLLAGEN PO Take 1 capsule by mouth daily.   Yes Historical Provider, MD  GLUTAMINE PO Take 1 capsule by mouth 4 (four) times daily.   Yes Historical Provider, MD  Multiple Vitamin (MULTIVITAMIN) tablet Take 1 tablet by mouth daily.   Yes Historical Provider, MD  Quercetin 250 MG TABS Take 500 mg by mouth 2 (two) times daily.  Yes Historical Provider, MD    Family History Family History  Problem Relation Age of Onset  . Drug abuse Father   . CAD Other     MI; angina  . Cancer Other     colon  . Diabetes Maternal Uncle   . Cancer Maternal Grandmother     lung cancer met. to brain  . Depression Maternal Grandmother   . Stroke Maternal Grandmother   . Colon cancer Maternal Grandmother     Social History Social History  Substance Use Topics  . Smoking status: Never Smoker  . Smokeless tobacco: Never Used     Comment: quit 2011  . Alcohol use No     Allergies   Patient  has no known allergies.   Review of Systems Review of Systems  Constitutional: Negative for chills and fever.  HENT: Negative for congestion and facial swelling.   Eyes: Negative for discharge and visual disturbance.  Respiratory: Negative for shortness of breath.   Cardiovascular: Negative for chest pain and palpitations.  Gastrointestinal: Positive for blood in stool. Negative for abdominal pain, diarrhea and vomiting.  Musculoskeletal: Negative for arthralgias and myalgias.  Skin: Negative for color change and rash.  Neurological: Positive for dizziness. Negative for tremors, syncope and headaches.  Psychiatric/Behavioral: Negative for confusion and dysphoric mood.     Physical Exam Updated Vital Signs BP 118/69 (BP Location: Left Arm)   Pulse 66   Temp 98.5 F (36.9 C) (Oral)   Resp 15   Ht _0  (1.778 m)   Wt 195 lb (88.5 kg)   SpO2 99%   BMI 27.98 kg/m   Physical Exam  Constitutional: He is oriented to person, place, and time. He appears well-developed and well-nourished.  HENT:  Head: Normocephalic and atraumatic.  Eyes: EOM are normal. Pupils are equal, round, and reactive to light.  Neck: Normal range of motion. Neck supple. No JVD present.  Cardiovascular: Normal rate and regular rhythm.  Exam reveals no gallop and no friction rub.   No murmur heard. Pulmonary/Chest: No respiratory distress. He has no wheezes.  Abdominal: He exhibits no distension and no mass. There is no tenderness. There is no rebound and no guarding.  Musculoskeletal: Normal range of motion.  Neurological: He is alert and oriented to person, place, and time.  Skin: No rash noted. No pallor.  Psychiatric: He has a normal mood and affect. His behavior is normal.  Nursing note and vitals reviewed.    ED Treatments / Results  Labs (all labs ordered are listed, but only abnormal results are displayed) Labs Reviewed  BASIC METABOLIC PANEL - Abnormal; Notable for the following:        Result Value   Glucose, Bld 101 (*)    Creatinine, Ser 1.37 (*)    All other components within normal limits  CBC - Abnormal; Notable for the following:    RBC 3.09 (*)    Hemoglobin 6.3 (*)    HCT 21.0 (*)    MCV 68.0 (*)    MCH 20.4 (*)    RDW 15.8 (*)    Platelets 527 (*)    All other components within normal limits  URINALYSIS, ROUTINE W REFLEX MICROSCOPIC - Abnormal; Notable for the following:    Color, Urine STRAW (*)    Leukocytes, UA TRACE (*)    All other components within normal limits  C-REACTIVE PROTEIN  CBG MONITORING, ED  PREPARE RBC (CROSSMATCH)  TYPE AND SCREEN    EKG  EKG  Interpretation  Date/Time:  Friday February 19 2017 15:51:01 EDT Ventricular Rate:  77 PR Interval:    QRS Duration: 92 QT Interval:  363 QTC Calculation: 411 R Axis:   86 Text Interpretation:  Sinus rhythm No significant change since last tracing Confirmed by Rilyn Scroggs MD, Quillian Quince ([id number]) on 02/19/2017 6:45:03 PM       Radiology No results found.  Procedures Procedures (including critical care time)  Medications Ordered in ED Medications  0.9 %  sodium chloride infusion (not administered)     Initial Impression / Assessment and Plan / ED Course  I have reviewed the triage vital signs and the nursing notes.  Pertinent labs & imaging results that were available during my care of the patient were reviewed by me and considered in my medical decision making (see chart for details).     26 yo M With a chief complaint of symptomatically anemia. Patient has been having some rectal bleeding off and on with his ulcerative colitis. I think this is likely the source. Will give patient 2 units of blood. I discussed the case with Dr. Benson Norway, gastroenterology. He recommended getting a CRP and holding off on steroids at this time. He'll evaluate the patient in the morning.  CRITICAL CARE Performed by: Cecilio Asper   Total critical care time: 35 minutes  Critical care time was exclusive  of separately billable procedures and treating other patients.  Critical care was necessary to treat or prevent imminent or life-threatening deterioration.  Critical care was time spent personally by me on the following activities: development of treatment plan with patient and/or surrogate as well as nursing, discussions with consultants, evaluation of patient's response to treatment, examination of patient, obtaining history from patient or surrogate, ordering and performing treatments and interventions, ordering and review of laboratory studies, ordering and review of radiographic studies, pulse oximetry and re-evaluation of patient's condition.  The patients results and plan were reviewed and discussed.   Any x-rays performed were independently reviewed by myself.   Differential diagnosis were considered with the presenting HPI.  Medications  0.9 %  sodium chloride infusion (not administered)    Vitals:   02/19/17 1532 02/19/17 1743 02/19/17 1950  BP: 135/70 136/84 118/69  Pulse: 74 72 66  Resp: _0 Temp: 98.4 F (36.9 C)  98.5 F (36.9 C)  TempSrc: Oral  Oral  SpO2: 99% 100% 99%  Weight: 195 lb (88.5 kg)    Height: _1  (1.778 m)      Final diagnoses:  Lower GI bleed    Admission/ observation were discussed with the admitting physician, patient and/or family and they are comfortable with the plan.   Final Clinical Impressions(s) / ED Diagnoses   Final diagnoses:  Lower GI bleed    New Prescriptions New Prescriptions   No medications on file     Deno Etienne, DO 02/19/17 2021

## 2017-02-19 NOTE — Telephone Encounter (Signed)
[facility] Patient Name: Kenneth Terrell DOB: [birth year] Initial Comment Caller says, headache, light headed when he stands up, he is losing blood in hisstool, he has ulcerative colitis flare ups. Nurse Assessment Nurse: Markus Daft, RN, Sherre Poot Date/Time (Eastern Time): 02/19/2017 2:04:56 PM Confirm and document reason for call. If symptomatic, describe symptoms. ---Caller states that he has a moderate "all day" headache, c/o light headed when he stands up, he is losing a lot blood at times in his loose stool, he has ulcerative colitis flare ups. S/S started 2 months ago. Does the patient have any new or worsening symptoms? ---Yes Will a triage be completed? ---Yes Related visit to physician within the last 2 weeks? ---No Does the PT have any chronic conditions? (i.e. diabetes, asthma, etc.) ---Yes List chronic conditions. ---Ulcerative colitis Is this a behavioral health or substance abuse call? ---No Guidelines Guideline Title Affirmed Question Affirmed Notes Rectal Bleeding MODERATE rectal bleeding (small blood clots, passing blood without stool, or toilet water turns red) Final Disposition User See Physician within [facility], RN, [place] Comments RN set up appt with Tor Netters for 3:15 pm at [facility] location as the [place] and [facility] (per pt request) had no openings. Referrals REFERRED TO PCP OFFICE Disagree/Comply: Comply

## 2017-02-19 NOTE — H&P (Signed)
History and Physical    Kenneth Terrell GXQ:[id number] DOB: 01/30/1991 DOA: 02/19/2017  PCP: [name], MD   Patient coming from: Home.  I have personally briefly reviewed patient's old medical records in [place]  Chief Complaint: Dizziness and headache.  HPI: Kenneth Terrell is a 26 y.o. male with medical history significant of ADHD, depression, ulcerative colitis who comes to emergency department with complaints of progressively worse and more frequent headaches associated with dizziness. He also complains of fatigue, hypersomnia, occasional epigastric abdominal pain, occasional diarrhea and frequent hematochezia. He denies fever, chills, productive cough, chest pain, palpitations, diaphoresis, PND, orthopnea or pitting edema of the lower extremities. He denies dysuria, frequency or hematuria. Symptoms are similar to when he was anemic last year and underwent colonoscopy with Dr. Carmie End which showed left-sided ulcerative colitis with pathology showing chronic mildly active colitis. He states that he has not been taking his budesonide tablets in the past 2 months or so.  ED Course: Workup in the emergency department showed WBC of 8.3, hemoglobin level of 6.3 g/dL and platelets of 527. Sodium was 136, potassium 4.6, chloride 104, bicarbonate 27. BUN was 15, creatinine 1.37 and glucose 101 mg/dL. EKG was normal sinus rhythm.  Review of Systems: As per HPI otherwise 10 point review of systems negative.    Past Medical History:  Diagnosis Date  . ADHD 01/11/2008   4 YOA   . BIPOLAR DISORDER UNSPECIFIED 01/11/2008   actually thought more depression - ?misdiagnosis  . History of chlamydia 2008   treated  . Ulcerative colitis ([place]) 03/2016   by colonoscopy    Past Surgical History:  Procedure Laterality Date  . [facility]  1999   x 1 week  . COLONOSCOPY  03/2016   Ulcerative colitis by biopsy ([facility])  . EXTERNAL EAR SURGERY    . KELOID EXCISION  10/2007   left ear  (Dr. Towanda Malkin)     reports that he has never smoked. He has never used smokeless tobacco. He reports that he does not drink alcohol or use drugs.  No Known Allergies  Family History  Problem Relation Age of Onset  . Drug abuse Father   . CAD Other     MI; angina  . Cancer Other     colon  . Diabetes Maternal Uncle   . Cancer Maternal Grandmother     lung cancer met. to brain  . Depression Maternal Grandmother   . Stroke Maternal Grandmother   . Colon cancer Maternal Grandmother     Prior to Admission medications   Medication Sig Start Date End Date Taking? Authorizing Provider  Budesonide (UCERIS) 9 MG TB24 Take 9 mg by mouth daily.   Yes Historical Provider, MD  COLLAGEN PO Take 1 capsule by mouth daily.   Yes Historical Provider, MD  GLUTAMINE PO Take 1 capsule by mouth 4 (four) times daily.   Yes Historical Provider, MD  Multiple Vitamin (MULTIVITAMIN) tablet Take 1 tablet by mouth daily.   Yes Historical Provider, MD  Quercetin 250 MG TABS Take 500 mg by mouth 2 (two) times daily.   Yes Historical Provider, MD    Physical Exam:  Constitutional: NAD, calm, comfortable Vitals:   02/19/17 2000 02/19/17 2015 02/19/17 2030 02/19/17 2109  BP:    138/78  Pulse: 66 69 71 70  Resp: (!) 25 17 15 16   Temp:    98.9 F (37.2 C)  TempSrc:    Oral  SpO2: 100% 100% 100% 100%  Weight:    83.6 kg (184 lb 3.2 oz)  Height:    5' 10"  (1.778 m)   Eyes: PERRL, lids and conjunctivae are pale ENMT: Mucous membranes are moist. Posterior pharynx clear of any exudate or lesions.Normal dentition.  Neck: normal, supple, no masses, no thyromegaly Respiratory: clear to auscultation bilaterally, no wheezing, no crackles. Normal respiratory effort. No accessory muscle use.  Cardiovascular: Regular rate and rhythm, no murmurs / rubs / gallops. No extremity edema. 2+ pedal pulses. No carotid bruits.  Abdomen: Soft, positive epigastric tenderness, no masses palpated. No hepatosplenomegaly. Bowel  sounds positive.  Musculoskeletal: no clubbing / cyanosis. No joint deformity upper and lower extremities. Good ROM, no contractures. Normal muscle tone.  Skin: no rashes, lesions, ulcers.  Neurologic: CN 2-12 grossly intact. Sensation intact, DTR normal. Strength 5/5 in all 4.  Psychiatric: Normal judgment and insight. Alert and oriented x 3. Normal mood.    Labs on Admission: I have personally reviewed following labs and imaging studies  CBC:  Recent Labs Lab 02/19/17 1606  WBC 8.3  HGB 6.3*  HCT 21.0*  MCV 68.0*  PLT 814*   Basic Metabolic Panel:  Recent Labs Lab 02/19/17 1606  NA 136  K 4.6  CL 104  CO2 27  GLUCOSE 101*  BUN 15  CREATININE 1.37*  CALCIUM 9.0   GFR: Estimated Creatinine Clearance: 84.4 mL/min (A) (by C-G formula based on SCr of 1.37 mg/dL (H)). Liver Function Tests: No results for input(s): AST, ALT, ALKPHOS, BILITOT, PROT, ALBUMIN in the last 168 hours. No results for input(s): LIPASE, AMYLASE in the last 168 hours. No results for input(s): AMMONIA in the last 168 hours. Coagulation Profile: No results for input(s): INR, PROTIME in the last 168 hours. Cardiac Enzymes: No results for input(s): CKTOTAL, CKMB, CKMBINDEX, TROPONINI in the last 168 hours. BNP (last 3 results) No results for input(s): PROBNP in the last 8760 hours. HbA1C: No results for input(s): HGBA1C in the last 72 hours. CBG:  Recent Labs Lab 02/19/17 1556  GLUCAP 83   Lipid Profile: No results for input(s): CHOL, HDL, LDLCALC, TRIG, CHOLHDL, LDLDIRECT in the last 72 hours. Thyroid Function Tests: No results for input(s): TSH, T4TOTAL, FREET4, T3FREE, THYROIDAB in the last 72 hours. Anemia Panel: No results for input(s): VITAMINB12, FOLATE, FERRITIN, TIBC, IRON, RETICCTPCT in the last 72 hours. Urine analysis:    Component Value Date/Time   COLORURINE STRAW (A) 02/19/2017 1516   APPEARANCEUR CLEAR 02/19/2017 1516   LABSPEC 1.009 02/19/2017 1516   PHURINE 6.0  02/19/2017 1516   GLUCOSEU NEGATIVE 02/19/2017 1516   HGBUR NEGATIVE 02/19/2017 1516   HGBUR negative 09/09/2010 0000   BILIRUBINUR NEGATIVE 02/19/2017 1516   BILIRUBINUR negative 12/26/2013 1059   KETONESUR NEGATIVE 02/19/2017 1516   PROTEINUR NEGATIVE 02/19/2017 1516   UROBILINOGEN 0.2 12/26/2013 1059   UROBILINOGEN 0.2 09/09/2010 0000   NITRITE NEGATIVE 02/19/2017 1516   LEUKOCYTESUR TRACE (A) 02/19/2017 1516    Radiological Exams on Admission: No results found.  EKG: Independently reviewed.  Vent. rate 77 BPM PR interval * ms QRS duration 92 ms QT/QTc 363/411 ms P-R-T axes 56 86 52 Sinus rhythm  Assessment/Plan Principal Problem:   LGI bleed The patient has been having frequent hematochezia. Admit to telemetry/inpatient. Nothing by mouth after midnight. Continue packed RBC transfusion. Monitor hematocrit and hemoglobin. GI will evaluate in a.m.  Active Problems:   Ulcerative colitis ([place]) Has not been taking budesonide. Will await for GI reevaluation before resuming.  Anemia due to blood loss Continue packed RBC transfusion. Follow-up hematocrit and hemoglobin.    DVT prophylaxis: Lovenox. Code Status: Full code. Family Communication:  Disposition Plan: Admit for pain control and case manager in . Consults called: Dr. Benson Norway was contacted by the emergency department and will see the patient in a.m. Paged earlier by myself awaiting response. Admission status: Observation/Telemetry.   Reubin Milan MD [facility] Pager [phone].  If 7PM-7AM, please contact night-coverage [email or web address] Password [facility]  02/19/2017, 11:04 PM

## 2017-02-20 LAB — COMPREHENSIVE METABOLIC PANEL
ALT: 16 U/L — ABNORMAL LOW (ref 17–63)
AST: 21 U/L (ref 15–41)
Albumin: 3 g/dL — ABNORMAL LOW (ref 3.5–5.0)
Alkaline Phosphatase: 48 U/L (ref 38–126)
Anion gap: 6 (ref 5–15)
BUN: 13 mg/dL (ref 6–20)
CO2: 26 mmol/L (ref 22–32)
Calcium: 9 mg/dL (ref 8.9–10.3)
Chloride: 105 mmol/L (ref 101–111)
Creatinine, Ser: 1.6 mg/dL — ABNORMAL HIGH (ref 0.61–1.24)
GFR calc Af Amer: >60 mL/min (ref >60)
GFR calc non Af Amer: 58 mL/min — ABNORMAL LOW (ref >60)
Glucose, Bld: 90 mg/dL (ref 65–99)
Potassium: 4.3 mmol/L (ref 3.5–5.1)
Sodium: 137 mmol/L (ref 135–145)
Total Bilirubin: 0.6 mg/dL (ref 0.3–1.2)
Total Protein: 6.7 g/dL (ref 6.5–8.1)

## 2017-02-20 LAB — CBC WITH DIFFERENTIAL/PLATELET
Basophils Absolute: 0.1 10*3/uL (ref 0.0–0.1)
Basophils Relative: 1 %
Eosinophils Absolute: 0.3 10*3/uL (ref 0.0–0.7)
Eosinophils Relative: 4 %
HCT: 25.6 % — ABNORMAL LOW (ref 39.0–52.0)
Hemoglobin: 7.9 g/dL — ABNORMAL LOW (ref 13.0–17.0)
Lymphocytes Relative: 15 %
Lymphs Abs: 1.3 10*3/uL (ref 0.7–4.0)
MCH: 22.1 pg — ABNORMAL LOW (ref 26.0–34.0)
MCHC: 30.9 g/dL (ref 30.0–36.0)
MCV: 71.7 fL — ABNORMAL LOW (ref 78.0–100.0)
Monocytes Absolute: 1.4 10*3/uL — ABNORMAL HIGH (ref 0.1–1.0)
Monocytes Relative: 16 %
Neutro Abs: 5.5 10*3/uL (ref 1.7–7.7)
Neutrophils Relative %: 64 %
Platelets: 485 10*3/uL — ABNORMAL HIGH (ref 150–400)
RBC: 3.57 MIL/uL — ABNORMAL LOW (ref 4.22–5.81)
RDW: 17.7 % — ABNORMAL HIGH (ref 11.5–15.5)
WBC: 8.6 10*3/uL (ref 4.0–10.5)

## 2017-02-20 LAB — C-REACTIVE PROTEIN: CRP: 1.7 mg/dL — ABNORMAL HIGH (ref <1.0)

## 2017-02-20 MED ORDER — METHYLPREDNISOLONE SODIUM SUCC 40 MG IJ SOLR
40.0000 mg | Freq: Two times a day (BID) | INTRAMUSCULAR | Status: DC
  Administered 2017-02-20 – 2017-02-22 (×4): 40 mg via INTRAVENOUS
  Filled 2017-02-20 (×4): qty 1

## 2017-02-20 MED ORDER — MESALAMINE 1000 MG RE SUPP
1000.0000 mg | Freq: Every day | RECTAL | Status: DC
  Administered 2017-02-20 – 2017-02-22 (×3): 1000 mg via RECTAL
  Filled 2017-02-20 (×3): qty 1

## 2017-02-20 MED ORDER — FAMOTIDINE 20 MG PO TABS
20.0000 mg | ORAL_TABLET | Freq: Every day | ORAL | Status: DC
  Administered 2017-02-21 – 2017-02-23 (×3): 20 mg via ORAL
  Filled 2017-02-20 (×3): qty 1

## 2017-02-20 MED ORDER — MESALAMINE 400 MG PO CPDR
1600.0000 mg | DELAYED_RELEASE_CAPSULE | Freq: Three times a day (TID) | ORAL | Status: DC
  Administered 2017-02-20 – 2017-02-23 (×10): 1600 mg via ORAL
  Filled 2017-02-20 (×12): qty 4

## 2017-02-20 NOTE — Progress Notes (Signed)
PROGRESS NOTE    Kenneth Terrell  EBR:[id number] DOB: 1991-03-29 DOA: 02/19/2017 PCP: [name], MD  Outpatient Specialists:   Dr. Loletha Carrow of [facility]  Brief Narrative:   26 y/o ? ADHD/Bipolar GERD Hemorrhoids Referred to GI Dr. Loletha Carrow 1 year ago 03/2016 brb pr and abd discomfort-COlonoscopy however showed Ulcerative colitis-recommended at the time budesonide 9 mg daily and Canasa suppositry --Insurance issue sprompted change ot colozal which did not help  [facility] with c/o headache and weakness over 1-2 weeks.  Has been having some brb per rectum-painless Found to have hemoglobin 6.3-trasnfused 2 U prbc and admitted  GI consulted  Assessment & Plan:   Principal Problem:   LGI bleed Active Problems:   Ulcerative colitis ([place])   Anemia due to blood loss  Anemia of chr blood loss-transfused 2 U PRBC-rpt hemoglobin is pending.  trasnfusion threshol below 7-not hypotensive-d/c saline Ulcerative colitis complicated by non-compliance-appreicate Dr. Benson Norway input.  Starting IV solu-medrol, canasa 1000 Retally and Delzicol  As well per GI ADHA and Bipolar-not on meds GERD-change Famotidine to PO   Full code Inpatient no family home 48-72 hours once resolved sympotms    Fair, hungry.  No n/v/cp No dark nor tarry stool no fever no chillsSubjective:    Objective: Vitals:   02/20/17 0333 02/20/17 0349 02/20/17 0601 02/20/17 1312  BP: (!) 110/54 (!) 105/54 (!) 118/53 116/69  Pulse: 60 60 60 72  Resp: 12 14 14 16   Temp: 98.6 F (37 C) 98.7 F (37.1 C) 98 F (36.7 C) 98.2 F (36.8 C)  TempSrc: Oral Oral Oral Oral  SpO2: 100% 100% 100% 100%  Weight:      Height:        Intake/Output Summary (Last 24 hours) at 02/20/17 1358 Last data filed at 02/20/17 0881  Gross per 24 hour  Intake             1470 ml  Output                0 ml  Net             1470 ml   Filed Weights   02/19/17 1532 02/19/17 2109  Weight: 88.5 kg (195 lb) 83.6 kg (184  lb 3.2 oz)    Examination:  General exam- calm and comfortable  Respiratory system: Clear to auscultation. . Cardiovascular system: S1 & S2 heard, RRR. No JVD, no murmurs. Gastrointestinal system: Abdomen is nondistended, soft and nontender.rectal deferred. Central nervous system: Alert and oriented. No focal neurological deficits. Extremities: Symmetric 5 x 5 power. Skin: No rashes, lesions or ulcers Psychiatry: Judgement and insight appear normal. Mood & affect appropriate.     Data Reviewed: I have personally reviewed following labs and imaging studies  CBC:  Recent Labs Lab 02/19/17 1606  WBC 8.3  HGB 6.3*  HCT 21.0*  MCV 68.0*  PLT 103*   Basic Metabolic Panel:  Recent Labs Lab 02/19/17 1606 02/20/17 0755  NA 136 137  K 4.6 4.3  CL 104 105  CO2 27 26  GLUCOSE 101* 90  BUN 15 13  CREATININE 1.37* 1.60*  CALCIUM 9.0 9.0   GFR: Estimated Creatinine Clearance: 72.2 mL/min (A) (by C-G formula based on SCr of 1.6 mg/dL (H)). Liver Function Tests:  Recent Labs Lab 02/20/17 0755  AST 21  ALT 16*  ALKPHOS 48  BILITOT 0.6  PROT 6.7  ALBUMIN 3.0*   No results for input(s): LIPASE, AMYLASE  in the last 168 hours. No results for input(s): AMMONIA in the last 168 hours. Coagulation Profile: No results for input(s): INR, PROTIME in the last 168 hours. Cardiac Enzymes: No results for input(s): CKTOTAL, CKMB, CKMBINDEX, TROPONINI in the last 168 hours. BNP (last 3 results) No results for input(s): PROBNP in the last 8760 hours. HbA1C: No results for input(s): HGBA1C in the last 72 hours. CBG:  Recent Labs Lab 02/19/17 1556  GLUCAP 83   Lipid Profile: No results for input(s): CHOL, HDL, LDLCALC, TRIG, CHOLHDL, LDLDIRECT in the last 72 hours. Thyroid Function Tests: No results for input(s): TSH, T4TOTAL, FREET4, T3FREE, THYROIDAB in the last 72 hours. Anemia Panel: No results for input(s): VITAMINB12, FOLATE, FERRITIN, TIBC, IRON, RETICCTPCT in the  last 72 hours. Urine analysis:    Component Value Date/Time   COLORURINE STRAW (A) 02/19/2017 1516   APPEARANCEUR CLEAR 02/19/2017 1516   LABSPEC 1.009 02/19/2017 1516   PHURINE 6.0 02/19/2017 1516   GLUCOSEU NEGATIVE 02/19/2017 1516   HGBUR NEGATIVE 02/19/2017 1516   HGBUR negative 09/09/2010 0000   BILIRUBINUR NEGATIVE 02/19/2017 1516   BILIRUBINUR negative 12/26/2013 1059   KETONESUR NEGATIVE 02/19/2017 1516   PROTEINUR NEGATIVE 02/19/2017 1516   UROBILINOGEN 0.2 12/26/2013 1059   UROBILINOGEN 0.2 09/09/2010 0000   NITRITE NEGATIVE 02/19/2017 1516   LEUKOCYTESUR TRACE (A) 02/19/2017 1516   Sepsis Labs: @LABRCNTIP (procalcitonin:4,lacticidven:4)  )No results found for this or any previous visit (from the past 240 hour(s)).       Radiology Studies: No results found.      Scheduled Meds: . famotidine (PEPCID) IV  20 mg Intravenous Q12H  . Mesalamine  1,600 mg Oral TID  . mesalamine  1,000 mg Rectal QHS  . methylPREDNISolone (SOLU-MEDROL) injection  40 mg Intravenous Q12H  . sodium chloride flush  3 mL Intravenous Q12H   Continuous Infusions: . sodium chloride 100 mL/hr at 02/20/17 0619     LOS: 1 day    Time spent: [place], MD [facility]   If 7PM-7AM, please contact night-coverage [email or web address] Password [email or web address] 02/20/2017, 1:58 PM

## 2017-02-20 NOTE — Consult Note (Signed)
Reason for Consult: Left sided UC and symptomatic anemia Referring Physician: [place] HPI: This is a 26 year old male with a PMH of left-sided UC diagnosed 03/2016 admitted to the hospital with complaints of dizziness, headaches, and fatigue.  Last year he was started on budesonide by Dr. Loletha Carrow, but he has not been taking his medication for the past 2 months.  He has had issues with obtaining coverage for his medications as a result of insurance issues.  Uceris and Lialda were the choice medications, but he had to be started on Colazal without much improvement.  Delzicol was prescribed in August, but he did not pick up the medication.  Dr. Loletha Carrow notes that compliance is a problem for him.  In January he had a flare and oral prednisone was started, but he states that he did not respond well.  Recently he reports that he was able to obtain Uceris and he used it for 2.5 weeks without any noticeable difference.  Subsequently he stopped using the medication.  He opted to use "natural remedies" and he improved, in his opinion.  He feels that his current anemia was from the time of his flair in January.  Past Medical History:  Diagnosis Date  . ADHD 01/11/2008   3 YOA   . BIPOLAR DISORDER UNSPECIFIED 01/11/2008   actually thought more depression - ?misdiagnosis  . History of chlamydia 2008   treated  . Ulcerative colitis ([place]) 03/2016   by colonoscopy    Past Surgical History:  Procedure Laterality Date  . [facility]  1999   x 1 week  . COLONOSCOPY  03/2016   Ulcerative colitis by biopsy ([facility])  . EXTERNAL EAR SURGERY    . KELOID EXCISION  10/2007   left ear (Dr. Towanda Malkin)    Family History  Problem Relation Age of Onset  . Drug abuse Father   . CAD Other     MI; angina  . Cancer Other     colon  . Diabetes Maternal Uncle   . Cancer Maternal Grandmother     lung cancer met. to brain  . Depression Maternal Grandmother   . Stroke Maternal Grandmother   .  Colon cancer Maternal Grandmother     Social History:  reports that he has never smoked. He has never used smokeless tobacco. He reports that he does not drink alcohol or use drugs.  Allergies: No Known Allergies  Medications:  Scheduled: . famotidine (PEPCID) IV  20 mg Intravenous Q12H  . sodium chloride flush  3 mL Intravenous Q12H   Continuous: . sodium chloride 100 mL/hr at 02/20/17 2353    Results for orders placed or performed during the hospital encounter of 02/19/17 (from the past 24 hour(s))  Urinalysis, Routine w reflex microscopic     Status: Abnormal   Collection Time: 02/19/17  3:16 PM  Result Value Ref Range   Color, Urine STRAW (A) YELLOW   APPearance CLEAR CLEAR   Specific Gravity, Urine 1.009 1.005 - 1.030   pH 6.0 5.0 - 8.0   Glucose, UA NEGATIVE NEGATIVE mg/dL   Hgb urine dipstick NEGATIVE NEGATIVE   Bilirubin Urine NEGATIVE NEGATIVE   Ketones, ur NEGATIVE NEGATIVE mg/dL   Protein, ur NEGATIVE NEGATIVE mg/dL   Nitrite NEGATIVE NEGATIVE   Leukocytes, UA TRACE (A) NEGATIVE   RBC / HPF 0-5 0 - 5 RBC/hpf   WBC, UA 0-5 0 - 5 WBC/hpf   Bacteria, UA NONE SEEN NONE SEEN  Squamous Epithelial / LPF NONE SEEN NONE SEEN  CBG monitoring, ED     Status: None   Collection Time: 02/19/17  3:56 PM  Result Value Ref Range   Glucose-Capillary 83 65 - 99 mg/dL  Basic metabolic panel     Status: Abnormal   Collection Time: 02/19/17  4:06 PM  Result Value Ref Range   Sodium 136 135 - 145 mmol/L   Potassium 4.6 3.5 - 5.1 mmol/L   Chloride 104 101 - 111 mmol/L   CO2 27 22 - 32 mmol/L   Glucose, Bld 101 (H) 65 - 99 mg/dL   BUN 15 6 - 20 mg/dL   Creatinine, Ser 1.37 (H) 0.61 - 1.24 mg/dL   Calcium 9.0 8.9 - 10.3 mg/dL   GFR calc non Af Amer >60 >60 mL/min   GFR calc Af Amer >60 >60 mL/min   Anion gap 5 5 - 15  CBC     Status: Abnormal   Collection Time: 02/19/17  4:06 PM  Result Value Ref Range   WBC 8.3 4.0 - 10.5 K/uL   RBC 3.09 (L) 4.22 - 5.81 MIL/uL    Hemoglobin 6.3 (LL) 13.0 - 17.0 g/dL   HCT 21.0 (L) 39.0 - 52.0 %   MCV 68.0 (L) 78.0 - 100.0 fL   MCH 20.4 (L) 26.0 - 34.0 pg   MCHC 30.0 30.0 - 36.0 g/dL   RDW 15.8 (H) 11.5 - 15.5 %   Platelets 527 (H) 150 - 400 K/uL  Prepare RBC     Status: None   Collection Time: 02/19/17  8:22 PM  Result Value Ref Range   Order Confirmation ORDER PROCESSED BY BLOOD BANK   Type and screen [place]     Status: None (Preliminary result)   Collection Time: 02/19/17  8:22 PM  Result Value Ref Range   ABO/RH(D) B POS    Antibody Screen NEG    Sample Expiration 02/22/2017    Unit Number [id number]    Blood Component Type RBC LR PHER2    Unit division 00    Status of Unit ISSUED    Transfusion Status OK TO TRANSFUSE    Crossmatch Result Compatible    Unit Number [id number]    Blood Component Type RED CELLS,LR    Unit division 00    Status of Unit ISSUED    Transfusion Status OK TO TRANSFUSE    Crossmatch Result Compatible   ABO/Rh     Status: None   Collection Time: 02/19/17  8:22 PM  Result Value Ref Range   ABO/RH(D) B POS   Comprehensive metabolic panel     Status: Abnormal   Collection Time: 02/20/17  7:55 AM  Result Value Ref Range   Sodium 137 135 - 145 mmol/L   Potassium 4.3 3.5 - 5.1 mmol/L   Chloride 105 101 - 111 mmol/L   CO2 26 22 - 32 mmol/L   Glucose, Bld 90 65 - 99 mg/dL   BUN 13 6 - 20 mg/dL   Creatinine, Ser 1.60 (H) 0.61 - 1.24 mg/dL   Calcium 9.0 8.9 - 10.3 mg/dL   Total Protein 6.7 6.5 - 8.1 g/dL   Albumin 3.0 (L) 3.5 - 5.0 g/dL   AST 21 15 - 41 U/L   ALT 16 (L) 17 - 63 U/L   Alkaline Phosphatase 48 38 - 126 U/L   Total Bilirubin 0.6 0.3 - 1.2 mg/dL   GFR calc non Af Amer 58 (L) >60  mL/min   GFR calc Af Amer >60 >60 mL/min   Anion gap 6 5 - 15     No results found.  ROS:  As stated above in the HPI otherwise negative.  Blood pressure (!) 118/53, pulse 60, temperature 98 F (36.7 C), temperature source Oral, resp. rate 14, height  5' 10"  (1.778 m), weight 83.6 kg (184 lb 3.2 oz), SpO2 100 %.    PE: Gen: NAD, Alert and Oriented HEENT:  /AT, EOMI Neck: Supple, no LAD Lungs: CTA Bilaterally CV: RRR without M/G/R ABM: Soft, NTND, +BS Ext: No C/C/E  Assessment/Plan: 1) Left sided UC. 2) Symptomatic anemia.   On average he has 5 loose bowel movements per day.  No reports of abdominal pain or fever.  Clinically he is stable, but he does not feel that he has ever been under reasonable control with his UC since his diagnosis.  He is amenable to undergoing IV steroid treatment and I will maximize his 5-ASA regimen orally and rectally.  At some time in the past he did have a response to using a rectal suppository, per his report.  Plan: 1) Solumedrol 40 mg IV Q12 hours. 2) Canasa 1000 mg PR. 3) Delzicol 4.8 grams QD, total. 4) Regular diet.  Yetunde Leis D 02/20/2017, 9:30 AM

## 2017-02-21 LAB — CBC WITH DIFFERENTIAL/PLATELET
Basophils Absolute: 0 10*3/uL (ref 0.0–0.1)
Basophils Relative: 0 %
Eosinophils Absolute: 0 10*3/uL (ref 0.0–0.7)
Eosinophils Relative: 0 %
HCT: 27.2 % — ABNORMAL LOW (ref 39.0–52.0)
Hemoglobin: 8.3 g/dL — ABNORMAL LOW (ref 13.0–17.0)
Lymphocytes Relative: 13 %
Lymphs Abs: 1.1 10*3/uL (ref 0.7–4.0)
MCH: 21.5 pg — ABNORMAL LOW (ref 26.0–34.0)
MCHC: 30.5 g/dL (ref 30.0–36.0)
MCV: 70.5 fL — ABNORMAL LOW (ref 78.0–100.0)
Monocytes Absolute: 0.5 10*3/uL (ref 0.1–1.0)
Monocytes Relative: 6 %
Neutro Abs: 7 10*3/uL (ref 1.7–7.7)
Neutrophils Relative %: 81 %
Platelets: 537 10*3/uL — ABNORMAL HIGH (ref 150–400)
RBC: 3.86 MIL/uL — ABNORMAL LOW (ref 4.22–5.81)
RDW: 17.5 % — ABNORMAL HIGH (ref 11.5–15.5)
WBC: 8.6 10*3/uL (ref 4.0–10.5)

## 2017-02-21 NOTE — Progress Notes (Signed)
Subjective: No complaints.  Feeling better.  He feels as if his diarrhea has slowed down.  Objective: Vital signs in last 24 hours: Temp:  [97.4 F (36.3 C)-98.8 F (37.1 C)] 97.4 F (36.3 C) (03/18 0537) Pulse Rate:  [45-75] 51 (03/18 0537) Resp:  [16-19] 19 (03/18 0537) BP: (114-118)/(59-69) 114/59 (03/18 0537) SpO2:  [98 %-100 %] 98 % (03/18 0537) Last BM Date: 02/20/17  Intake/Output from previous day: 03/17 0701 - 03/18 0700 In: 1660 [P.O.:1660] Out: 150 [Urine:150] Intake/Output this shift: No intake/output data recorded.  General appearance: alert and no distress GI: soft, non-tender; bowel sounds normal; no masses,  no organomegaly  Lab Results:  Recent Labs  02/19/17 1606 02/20/17 0755  WBC 8.3 8.6  HGB 6.3* 7.9*  HCT 21.0* 25.6*  PLT 527* 485*   BMET  Recent Labs  02/19/17 1606 02/20/17 0755  NA 136 137  K 4.6 4.3  CL 104 105  CO2 27 26  GLUCOSE 101* 90  BUN 15 13  CREATININE 1.37* 1.60*  CALCIUM 9.0 9.0   LFT  Recent Labs  02/20/17 0755  PROT 6.7  ALBUMIN 3.0*  AST 21  ALT 16*  ALKPHOS 48  BILITOT 0.6   PT/INR No results for input(s): LABPROT, INR in the last 72 hours. Hepatitis Panel No results for input(s): HEPBSAG, HCVAB, HEPAIGM, HEPBIGM in the last 72 hours. C-Diff No results for input(s): CDIFFTOX in the last 72 hours. Fecal Lactopherrin No results for input(s): FECLLACTOFRN in the last 72 hours.  Studies/Results: No results found.  Medications:  Scheduled: . famotidine  20 mg Oral Daily  . Mesalamine  1,600 mg Oral TID  . mesalamine  1,000 mg Rectal QHS  . methylPREDNISolone (SOLU-MEDROL) injection  40 mg Intravenous Q12H  . sodium chloride flush  3 mL Intravenous Q12H   Continuous:   Assessment/Plan: 1) Left-sided UC. 2) Anemia.   Clinically he is better with one dose of Solumedrol.  He will maintain the current regimen.  Plan: 1) Continue with Delzicol, Solumedrol, and Canasa. 2) [facility] to resume  care in AM.  LOS: 2 days   Isiah Scheel D 02/21/2017, 9:19 AM

## 2017-02-21 NOTE — Progress Notes (Signed)
PROGRESS NOTE    Kenneth Terrell  GHW:[id number] DOB: [birth year] DOA: 02/19/2017 PCP: [name], MD  Outpatient Specialists:   Dr. Loletha Carrow of [facility]  Brief Narrative:   26 y/o ? ADHD/Bipolar GERD Hemorrhoids Referred to GI Dr. Loletha Carrow 1 year ago 03/2016 brb pr and abd discomfort-COlonoscopy however showed Ulcerative colitis-recommended at the time budesonide 9 mg daily and Canasa suppositry --Insurance issue sprompted change ot colozal which did not help  Admitted ot he ED at [facility] 3/16 with c/o headache and weakness over 1-2 weeks.  Has been having some brb per rectum-painless Found to have hemoglobin 6.3-trasnfused 2 U prbc and admitted  GI consulted  Assessment & Plan:   Principal Problem:   LGI bleed Active Problems:   Ulcerative colitis ([place])   Anemia due to blood loss  Anemia of chr blood loss-transfused 2 U PRBC-rpt hemoglobin 6-->8.3  trasnfusion threshol below 7-not hypotensive-d/c saline Ulcerative colitis complicated by non-compliance-appreicate Dr. Benson Norway input.  Starting IV solu-medrol, canasa 1000 Retally and Delzicol  as well per GI--will need follow up as OP ADHA and Bipolar-not on meds GERD-change Famotidine to PO   Full code Inpatient D/w mother on phone 3/18 home soon    Subjective:  Fair no n/v/cp Only 1 stool Eating and drinking  Objective: Vitals:   02/20/17 2005 02/21/17 0225 02/21/17 0252 02/21/17 0537  BP: 118/69   (!) 114/59  Pulse: 75 (!) 45 71 (!) 51  Resp: 18   19  Temp: 98.8 F (37.1 C)   97.4 F (36.3 C)  TempSrc: Oral   Oral  SpO2: 100%  100% 98%  Weight:      Height:        Intake/Output Summary (Last 24 hours) at 02/21/17 1003 Last data filed at 02/21/17 0900  Gross per 24 hour  Intake             1900 ml  Output              150 ml  Net             1750 ml   Filed Weights   02/19/17 1532 02/19/17 2109  Weight: 88.5 kg (195 lb) 83.6 kg (184 lb 3.2 oz)    Examination:  General exam- calm and comfortable    Respiratory system: Clear to auscultation. . Cardiovascular system: S1 & S2 heard, RRR. No JVD, no murmurs. Gastrointestinal system: Abdomen is nondistended, soft and nontender.rectal deferred. Central nervous system: Alert and oriented. No focal neurological deficits. Extremities: Symmetric 5 x 5 power. Skin: No rashes, lesions or ulcers Psychiatry: Judgement and insight appear normal. Mood & affect appropriate.     Data Reviewed: I have personally reviewed following labs and imaging studies  CBC:  Recent Labs Lab 02/19/17 1606 02/20/17 0755  WBC 8.3 8.6  NEUTROABS  --  5.5  HGB 6.3* 7.9*  HCT 21.0* 25.6*  MCV 68.0* 71.7*  PLT 527* 789*   Basic Metabolic Panel:  Recent Labs Lab 02/19/17 1606 02/20/17 0755  NA 136 137  K 4.6 4.3  CL 104 105  CO2 27 26  GLUCOSE 101* 90  BUN 15 13  CREATININE 1.37* 1.60*  CALCIUM 9.0 9.0   GFR: Estimated Creatinine Clearance: 72.2 mL/min (A) (by C-G formula based on SCr of 1.6 mg/dL (H)). Liver Function Tests:  Recent Labs Lab 02/20/17 0755  AST 21  ALT 16*  ALKPHOS 48  BILITOT 0.6  PROT 6.7  ALBUMIN 3.0*   No results  for input(s): LIPASE, AMYLASE in the last 168 hours. No results for input(s): AMMONIA in the last 168 hours. Coagulation Profile: No results for input(s): INR, PROTIME in the last 168 hours. Cardiac Enzymes: No results for input(s): CKTOTAL, CKMB, CKMBINDEX, TROPONINI in the last 168 hours. BNP (last 3 results) No results for input(s): PROBNP in the last 8760 hours. HbA1C: No results for input(s): HGBA1C in the last 72 hours. CBG:  Recent Labs Lab 02/19/17 1556  GLUCAP 83   Lipid Profile: No results for input(s): CHOL, HDL, LDLCALC, TRIG, CHOLHDL, LDLDIRECT in the last 72 hours. Thyroid Function Tests: No results for input(s): TSH, T4TOTAL, FREET4, T3FREE, THYROIDAB in the last 72 hours. Anemia Panel: No results for input(s): VITAMINB12, FOLATE, FERRITIN, TIBC, IRON, RETICCTPCT in the last 72  hours. Urine analysis:    Component Value Date/Time   COLORURINE STRAW (A) 02/19/2017 1516   APPEARANCEUR CLEAR 02/19/2017 1516   LABSPEC 1.009 02/19/2017 1516   PHURINE 6.0 02/19/2017 1516   GLUCOSEU NEGATIVE 02/19/2017 1516   HGBUR NEGATIVE 02/19/2017 1516   HGBUR negative 09/09/2010 0000   BILIRUBINUR NEGATIVE 02/19/2017 1516   BILIRUBINUR negative 12/26/2013 1059   KETONESUR NEGATIVE 02/19/2017 1516   PROTEINUR NEGATIVE 02/19/2017 1516   UROBILINOGEN 0.2 12/26/2013 1059   UROBILINOGEN 0.2 09/09/2010 0000   NITRITE NEGATIVE 02/19/2017 1516   LEUKOCYTESUR TRACE (A) 02/19/2017 1516   Sepsis Labs: @LABRCNTIP (procalcitonin:4,lacticidven:4)  )No results found for this or any previous visit (from the past 240 hour(s)).    Radiology Studies: No results found.  Scheduled Meds: . famotidine  20 mg Oral Daily  . Mesalamine  1,600 mg Oral TID  . mesalamine  1,000 mg Rectal QHS  . methylPREDNISolone (SOLU-MEDROL) injection  40 mg Intravenous Q12H  . sodium chloride flush  3 mL Intravenous Q12H   Continuous Infusions:    LOS: 2 days    Time spent: [place], MD [facility] (P) [phone]   If 7PM-7AM, please contact night-coverage [email or web address] Password [email or web address] 02/21/2017, 10:03 AM

## 2017-02-22 DIAGNOSIS — K51911 Ulcerative colitis, unspecified with rectal bleeding: Secondary | ICD-10-CM

## 2017-02-22 DIAGNOSIS — D500 Iron deficiency anemia secondary to blood loss (chronic): Secondary | ICD-10-CM

## 2017-02-22 DIAGNOSIS — K922 Gastrointestinal hemorrhage, unspecified: Secondary | ICD-10-CM

## 2017-02-22 LAB — TYPE AND SCREEN
ABO/RH(D): B POS
Antibody Screen: NEGATIVE
Unit division: 0
Unit division: 0

## 2017-02-22 LAB — BASIC METABOLIC PANEL
Anion gap: 6 (ref 5–15)
BUN: 12 mg/dL (ref 6–20)
CO2: 25 mmol/L (ref 22–32)
Calcium: 8.8 mg/dL — ABNORMAL LOW (ref 8.9–10.3)
Chloride: 105 mmol/L (ref 101–111)
Creatinine, Ser: 1.17 mg/dL (ref 0.61–1.24)
GFR calc Af Amer: >60 mL/min (ref >60)
GFR calc non Af Amer: >60 mL/min (ref >60)
Glucose, Bld: 102 mg/dL — ABNORMAL HIGH (ref 65–99)
Potassium: 3.8 mmol/L (ref 3.5–5.1)
Sodium: 136 mmol/L (ref 135–145)

## 2017-02-22 LAB — BPAM RBC
Blood Product Expiration Date: 201804122359
Blood Product Expiration Date: 201804132359
ISSUE DATE / TIME: 201803162345
ISSUE DATE / TIME: 201803170323
Unit Type and Rh: 7300
Unit Type and Rh: 7300

## 2017-02-22 MED ORDER — FERROUS SULFATE 325 (65 FE) MG PO TABS
325.0000 mg | ORAL_TABLET | Freq: Two times a day (BID) | ORAL | Status: DC
  Administered 2017-02-22 – 2017-02-23 (×2): 325 mg via ORAL
  Filled 2017-02-22 (×2): qty 1

## 2017-02-22 MED ORDER — PREDNISONE 20 MG PO TABS
40.0000 mg | ORAL_TABLET | Freq: Every day | ORAL | Status: DC
  Administered 2017-02-22 – 2017-02-23 (×2): 40 mg via ORAL
  Filled 2017-02-22 (×2): qty 2

## 2017-02-22 NOTE — Progress Notes (Signed)
     [facility] Progress Note  Chief Complaint:    Ulcerative colitis  Subjective: No abdominal pain, stools forming and less frequent. No further rectal bleeding. Tolerating solids.   Objective:  Vital signs in last 24 hours: Temp:  [97.6 F (36.4 C)-98.8 F (37.1 C)] 98.4 F (36.9 C) (03/19 0446) Pulse Rate:  [52-67] 52 (03/19 0446) Resp:  [16-18] 18 (03/19 0446) BP: (120-128)/(54-71) 120/63 (03/19 0446) SpO2:  [99 %-100 %] 100 % (03/19 0446) Last BM Date: 02/21/17 General:   Alert, well-developed, black male in NAD EENT:  Normal hearing, non icteric sclera, conjunctive pink.  Heart:  Regular rate and rhythm; no murmurs. now lower extremity edema Pulm: Normal respiratory effort, lungs CTA bilaterally without wheezes or crackles. Abdomen:  Soft, nondistended, nontender.  Normal bowel sounds, no masses felt. No hepatomegaly.    Neurologic:  Alert and  oriented x4;  grossly normal neurologically. Psych:  Alert and cooperative. Normal mood and affect.   Intake/Output from previous day: 03/18 0701 - 03/19 0700 In: 2580 [P.O.:2580] Out: -  Intake/Output this shift: No intake/output data recorded.  Lab Results:  Recent Labs  02/19/17 1606 02/20/17 0755 02/21/17 0834  WBC 8.3 8.6 8.6  HGB 6.3* 7.9* 8.3*  HCT 21.0* 25.6* 27.2*  PLT 527* 485* 537*   BMET  Recent Labs  02/19/17 1606 02/20/17 0755  NA 136 137  K 4.6 4.3  CL 104 105  CO2 27 26  GLUCOSE 101* 90  BUN 15 13  CREATININE 1.37* 1.60*  CALCIUM 9.0 9.0   LFT  Recent Labs  02/20/17 0755  PROT 6.7  ALBUMIN 3.0*  AST 21  ALT 16*  ALKPHOS 48  BILITOT 0.6   Assessment / Plan:  1. 26 yo male diagnosed with left sided UC in April. Insurance formulary and his medication compliance has complicated outpatient management according to Dr. Irving Burtonanis's office notes. Treated with periodic prednisone tapers and Canasa supp  He had no improvement on Colazal.  [facility]  [facility] opinion. Admitted now with loose stool, hematochezia, fatigue and anemia.  -Symptoms and bleeding improved on IV steroids, oral and topical mesalamine.  No abdominal pain, stools forming and no further bleeding. Tolerating solids. Will transition to oral steroids and continue Canasa supp -Baseline hgb ~ 13, it was 6.3 on admission. Hgb up to 8.3 after 2 units of blood. No further hematochezia so hopefully hgb will improve from here. Will start oral iron  2. AKI, Cr 1.6 yesterday.  -repeat BMET this am   Principal Problem:   LGI bleed Active Problems:   Ulcerative colitis ([facility])   Anemia due to blood loss    LOS: 3 days   Willette ClusterPaula Guenther NP 02/22/2017, 10:08 AM  Pager number [phone]  GI ATTENDING  Interval history data reviewed. Patient personally seen and examined. Agree with interval progress note. Patient doing better. Would convert steroids to by mouth. Continue mesalamine Protonix. Advance diet. Would anticipate discharge home tomorrow with appropriate follow-up with GI. Would place on oral iron therapy as well.  Wilhemina BonitoJohn N. Eda KeysPerry, Jr., M.D. [facility] Division of Gastroenterology

## 2017-02-22 NOTE — Progress Notes (Signed)
PROGRESS NOTE    Kenneth Terrell  FXT:[id number] DOB: 06-Nov-1991 DOA: 02/19/2017 PCP: [name], MD  Outpatient Specialists:   Dr. Loletha Carrow of [facility]  Brief Narrative:   26 y/o ? ADHD/Bipolar GERD Hemorrhoids Referred to GI Dr. Loletha Carrow 1 year ago 03/2016 brb pr and abd discomfort-COlonoscopy however showed Ulcerative colitis-recommended at the time budesonide 9 mg daily and Canasa suppositry --Insurance issues prompted change to colozal which did not help  Admitted ot [facility] ED at [facility] 3/16 with c/o headache and weakness over 1-2 weeks.  Has been having some brb per rectum-painless Found to have hemoglobin 6.3-trasnfused 2 U prbc and admitted  GI consulted  Assessment & Plan:   Principal Problem:   LGI bleed Active Problems:   Ulcerative colitis ([place])   Anemia due to blood loss  Anemia of chr blood loss-transfused 2 U PRBC-rpt hemoglobin 6-->8.3  trasnfusion threshol below 7-not hypotensive-d/c saline Ulcerative colitis complicated by non-compliance-appreicate Dr. Benson Norway input.  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    Subjective:  Fair eating 2 loose stools today  No n/v  Objective: Vitals:   02/21/17 0537 02/21/17 1407 02/21/17 2029 02/22/17 0446  BP: (!) 114/59 128/71 (!) 121/54 120/63  Pulse: (!) 51 64 67 (!) 52  Resp: 19 16 16 18   Temp: 97.4 F (36.3 C) 97.6 F (36.4 C) 98.8 F (37.1 C) 98.4 F (36.9 C)  TempSrc: Oral Oral Oral Oral  SpO2: 98% 100% 99% 100%  Weight:      Height:        Intake/Output Summary (Last 24 hours) at 02/22/17 1140 Last data filed at 02/21/17 2100  Gross per 24 hour  Intake             2340 ml  Output                0 ml  Net             2340 ml   Filed Weights    02/19/17 1532 02/19/17 2109  Weight: 88.5 kg (195 lb) 83.6 kg (184 lb 3.2 oz)    Examination:  General exam- calm and comfortable  Respiratory system: Clear to auscultation. . Cardiovascular system: S1 & S2 heard, RRR. No JVD, no murmurs. Gastrointestinal system: Abdomen is nondistended, soft and nontender.rectal deferred.   Data Reviewed: I have personally reviewed following labs and imaging studies  CBC:  Recent Labs Lab 02/19/17 1606 02/20/17 0755 02/21/17 0834  WBC 8.3 8.6 8.6  NEUTROABS  --  5.5 7.0  HGB 6.3* 7.9* 8.3*  HCT 21.0* 25.6* 27.2*  MCV 68.0* 71.7* 70.5*  PLT 527* 485* 299*   Basic Metabolic Panel:  Recent Labs Lab 02/19/17 1606 02/20/17 0755  NA 136 137  K 4.6 4.3  CL 104 105  CO2 27 26  GLUCOSE 101* 90  BUN 15 13  CREATININE 1.37* 1.60*  CALCIUM 9.0 9.0   GFR: Estimated Creatinine Clearance: 72.2 mL/min (A) (by C-G formula based on SCr of 1.6 mg/dL (H)). Liver Function Tests:  Recent Labs Lab 02/20/17 0755  AST 21  ALT 16*  ALKPHOS 48  BILITOT 0.6  PROT 6.7  ALBUMIN 3.0*   No results for input(s): LIPASE, AMYLASE in  the last 168 hours. No results for input(s): AMMONIA in the last 168 hours. Coagulation Profile: No results for input(s): INR, PROTIME in the last 168 hours. Cardiac Enzymes: No results for input(s): CKTOTAL, CKMB, CKMBINDEX, TROPONINI in the last 168 hours. BNP (last 3 results) No results for input(s): PROBNP in the last 8760 hours. HbA1C: No results for input(s): HGBA1C in the last 72 hours. CBG:  Recent Labs Lab 02/19/17 1556  GLUCAP 83   Lipid Profile: No results for input(s): CHOL, HDL, LDLCALC, TRIG, CHOLHDL, LDLDIRECT in the last 72 hours. Thyroid Function Tests: No results for input(s): TSH, T4TOTAL, FREET4, T3FREE, THYROIDAB in the last 72 hours. Anemia Panel: No results for input(s): VITAMINB12, FOLATE, FERRITIN, TIBC, IRON, RETICCTPCT in the last 72 hours. Urine analysis:    Component Value  Date/Time   COLORURINE STRAW (A) 02/19/2017 1516   APPEARANCEUR CLEAR 02/19/2017 1516   LABSPEC 1.009 02/19/2017 1516   PHURINE 6.0 02/19/2017 1516   GLUCOSEU NEGATIVE 02/19/2017 1516   HGBUR NEGATIVE 02/19/2017 1516   HGBUR negative 09/09/2010 0000   BILIRUBINUR NEGATIVE 02/19/2017 1516   BILIRUBINUR negative 12/26/2013 1059   KETONESUR NEGATIVE 02/19/2017 1516   PROTEINUR NEGATIVE 02/19/2017 1516   UROBILINOGEN 0.2 12/26/2013 1059   UROBILINOGEN 0.2 09/09/2010 0000   NITRITE NEGATIVE 02/19/2017 1516   LEUKOCYTESUR TRACE (A) 02/19/2017 1516   Sepsis Labs: @LABRCNTIP (procalcitonin:4,lacticidven:4)  )No results found for this or any previous visit (from the past 240 hour(s)).    Radiology Studies: No results found.  Scheduled Meds: . famotidine  20 mg Oral Daily  . Mesalamine  1,600 mg Oral TID  . mesalamine  1,000 mg Rectal QHS  . predniSONE  40 mg Oral Q breakfast  . sodium chloride flush  3 mL Intravenous Q12H   Continuous Infusions:    LOS: 3 days    Time spent: [place], MD [facility] ([facility]) [phone]   If 7PM-7AM, please contact night-coverage [email or web address] Password [email or web address] 02/22/2017, 11:40 AM

## 2017-02-23 ENCOUNTER — Telehealth: Payer: Self-pay

## 2017-02-23 DIAGNOSIS — K51311 Ulcerative (chronic) rectosigmoiditis with rectal bleeding: Secondary | ICD-10-CM

## 2017-02-23 LAB — HEMOGLOBIN AND HEMATOCRIT, BLOOD
HCT: 26.2 % — ABNORMAL LOW (ref 39.0–52.0)
Hemoglobin: 7.9 g/dL — ABNORMAL LOW (ref 13.0–17.0)

## 2017-02-23 MED ORDER — PREDNISONE 5 MG PO TABS: 35.0000 mg | ORAL_TABLET | Freq: Every day | ORAL | 0 refills | Status: DC

## 2017-02-23 MED ORDER — PREDNISONE 20 MG PO TABS: 40.0000 mg | ORAL_TABLET | Freq: Every day | ORAL | 0 refills | Status: DC

## 2017-02-23 MED ORDER — FERROUS SULFATE 325 (65 FE) MG PO TABS: 325.0000 mg | ORAL_TABLET | Freq: Two times a day (BID) | ORAL | 3 refills | Status: DC

## 2017-02-23 MED ORDER — PREDNISONE 5 MG PO TABS: 30.0000 mg | ORAL_TABLET | Freq: Every day | ORAL | 0 refills | Status: DC

## 2017-02-23 MED ORDER — MESALAMINE 400 MG PO CPDR: 1600.0000 mg | DELAYED_RELEASE_CAPSULE | Freq: Three times a day (TID) | ORAL | 0 refills | Status: DC

## 2017-02-23 MED ORDER — MESALAMINE 1000 MG RE SUPP: 1000.0000 mg | Freq: Every day | RECTAL | 12 refills | Status: DC

## 2017-02-23 NOTE — Progress Notes (Signed)
     [facility] Progress Note  CC:  Ulcerative colitis  Subjective:  Feels well.  No abdominal pain.  No rectal bleeding.  Tolerating solid diet.  For discharge today.  Objective:  Vital signs in last 24 hours: Temp:  [98 F (36.7 C)-98.8 F (37.1 C)] 98 F (36.7 C) (03/20 0603) Pulse Rate:  [44-52] 44 (03/20 0603) Resp:  [18-20] 20 (03/20 0603) BP: (111-130)/(52-68) 130/68 (03/20 0603) SpO2:  [97 %-99 %] 98 % (03/20 0603) Last BM Date: 02/22/17 General:  Alert, Well-developed, in NAD Heart:  Bradycardic; no murmurs Pulm:  CTAB.  No W/R/R. Abdomen:  Soft, non-distended.  BS present.  Non-tender.   Extremities:  Without edema. Neurologic:  Alert and oriented x 4;  grossly normal neurologically. Psych:  Alert and cooperative. Normal mood and affect.  Lab Results:  Recent Labs  02/21/17 0834  WBC 8.6  HGB 8.3*  HCT 27.2*  PLT 537*   BMET  Recent Labs  02/22/17 1107  NA 136  K 3.8  CL 105  CO2 25  GLUCOSE 102*  BUN 12  CREATININE 1.17  CALCIUM 8.8*   Assessment / Plan: 1. 26 yo male diagnosed with left sided UC in April. Insurance formulary and his medication compliance has complicated outpatient management according to Dr. Corena Pilgrim office notes. Treated with periodic prednisone tapers and Canasa supp.  He had no improvement on Colazal.  He has an appt at [facility] on April 5th for a second opinion. Admitted now with loose stool, hematochezia, fatigue and anemia.  -Symptoms and bleeding improved on steroids (IV then oral), oral and topical mesalamine.  No abdominal pain, stools forming and no further bleeding. Tolerating solids. -Baseline hgb ~ 13, it was 6.3 on admission. Hgb up to 8.3 after 2 units of blood. No further hematochezia so hopefully hgb will improve from here.  He would like Hgb rechecked before his discharge today.  Needs to be discharge on the following:  Prednisone 40 mg daily, taper by 5 mg weekly, Canasa suppository at bedtime, Asacol  1600 mg TID, Ferrous sulfate 325 mg BID with food  2. AKI, Cr 1.6 yesterday:  Cr normalized to 1.17.    LOS: 4 days   ZEHR, JESSICA D.  02/23/2017, 9:30 AM  Pager number [phone]  GI ATTENDING  Interval history data reviewed. Patient seen and examined. Agree with above interval progress note. Patient ready for discharge with medications as listed. GI follow-up in a few weeks as outlined. The patient requested paperwork filled out regarding his job place. This was taken care of by GI nursing. Will sign off.  Docia Chuck. Geri Seminole., M.D. [facility]enterology

## 2017-02-23 NOTE — Telephone Encounter (Signed)
Note ready, patient actually needed it to state it was from 3/8 not 3/12. Letter at front desk ready for patient to pick up. Prior office notes had been faxed to [facility]. Will fax hospital records when available to [phone].

## 2017-02-23 NOTE — Discharge Summary (Signed)
Physician Discharge Summary  Kenneth Terrell LFY:[id number] DOB: 08-11-1991 DOA: 02/19/2017  PCP: Kenneth Bush, MD  Admit date: 02/19/2017 Discharge date: 02/23/2017  Time spent: 35 minutes  Recommendations for Outpatient Follow-up:  1.  patient placed on prednisone taper for the next 3 weeks and different prescriptions given a prednisone 2. Patient is to continue Asacol as well as Canasa and iron which were prescribed this admission  Discharge Diagnoses:  Principal Problem:   LGI bleed Active Problems:   Ulcerative colitis ([place])   Anemia due to blood loss   Discharge Condition:  improved  Diet recommendation:  regular  Filed Weights   02/19/17 1532 02/19/17 2109  Weight: 88.5 kg (195 lb) 83.6 kg (184 lb 3.2 oz)    History of present illness:  26 y/o ? ADHD/Bipolar GERD Hemorrhoids Referred to GI Dr. Loletha Carrow 1 year ago 03/2016 brb pr and abd discomfort-COlonoscopy however showed Ulcerative colitis-recommended at the time budesonide 9 mg daily and Canasa suppositry --Insurance issues prompted change to colozal which did not help  Admitted [facility] ED at [facility] 3/16 with c/o headache and weakness over 1-2 weeks.  Has been having some brb per rectum-painless Found to have hemoglobin 6.3-transfused 2 U prbc and admitted  GI consulted  Hospital Course:  Anemia of chr blood loss-transfused 2 U PRBC-rpt hemoglobin 6-->8.3  trasnfusion threshol below 7-e Ulcerative colitis complicated by non-compliance-appreicate Dr. Benson Norway input.  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-follow bmet form this am GERD-change Famotidine to PO  Consultations:  GI  Discharge Exam: Vitals:   02/22/17 2025 02/23/17 0603  BP: (!) 111/52 130/68  Pulse: (!) 52 (!) 44   Resp: 19 20  Temp: 98.3 F (36.8 C) 98 F (36.7 C)    General: alert pleasant oriented and in nad Cardiovascular: s1 s2 rrr Respiratory: clear abd soft nt nd no rebound  Discharge Instructions   Discharge Instructions    Diet - low sodium heart healthy    Complete by:  As directed    Diet - low sodium heart healthy    Complete by:  As directed    Discharge instructions    Complete by:  As directed    look carefully at her medications and take the prednisone 40 mg for the first 7 days then start 35 mg which would be 7 tablets a day then 30 mg which will be 6 tablets a day and then follow-up with your gastroenterologist Please take all the other medications as indicated Please follow-up with [facility]   Increase activity slowly    Complete by:  As directed    Increase activity slowly    Complete by:  As directed      Current Discharge Medication List    START taking these medications   Details  ferrous sulfate 325 (65 FE) MG tablet Take 1 tablet (325 mg total) by mouth 2 (two) times daily with a meal. Qty: 30 tablet, Refills: 3    Mesalamine (ASACOL) 400 MG CPDR DR capsule Take 4 capsules (1,600 mg total) by mouth 3 (three) times daily. Qty: 180 capsule, Refills: 0    mesalamine (CANASA) 1000 MG suppository Place 1 suppository (1,000 mg total) rectally at bedtime. Qty: 30 suppository, Refills: 12    !!  predniSONE (DELTASONE) 20 MG tablet Take 2 tablets (40 mg total) by mouth daily with breakfast. Qty: 14 tablet, Refills: 0    !! predniSONE (DELTASONE) 5 MG tablet Take 7 tablets (35 mg total) by mouth daily with breakfast. Qty: 49 tablet, Refills: 0    !! predniSONE (DELTASONE) 5 MG tablet Take 6 tablets (30 mg total) by mouth daily with breakfast. Qty: 42 tablet, Refills: 0     !! - Potential duplicate medications found. Please discuss with provider.    CONTINUE these medications which have NOT CHANGED   Details  Budesonide (UCERIS) 9 MG TB24 Take 9 mg by mouth  daily.    COLLAGEN PO Take 1 capsule by mouth daily.    GLUTAMINE PO Take 1 capsule by mouth 4 (four) times daily.    Quercetin 250 MG TABS Take 500 mg by mouth 2 (two) times daily.      STOP taking these medications     Multiple Vitamin (MULTIVITAMIN) tablet        No Known Allergies    The results of significant diagnostics from this hospitalization (including imaging, microbiology, ancillary and laboratory) are listed below for reference.    Significant Diagnostic Studies: No results found.  Microbiology: No results found for this or any previous visit (from the past 240 hour(s)).   Labs: Basic Metabolic Panel:  Recent Labs Lab 02/19/17 1606 02/20/17 0755 02/22/17 1107  NA 136 137 136  K 4.6 4.3 3.8  CL 104 105 105  CO2 27 26 25   GLUCOSE 101* 90 102*  BUN 15 13 12   CREATININE 1.37* 1.60* 1.17  CALCIUM 9.0 9.0 8.8*   Liver Function Tests:  Recent Labs Lab 02/20/17 0755  AST 21  ALT 16*  ALKPHOS 48  BILITOT 0.6  PROT 6.7  ALBUMIN 3.0*   No results for input(s): LIPASE, AMYLASE in the last 168 hours. No results for input(s): AMMONIA in the last 168 hours. CBC:  Recent Labs Lab 02/19/17 1606 02/20/17 0755 02/21/17 0834 02/23/17 1045  WBC 8.3 8.6 8.6  --   NEUTROABS  --  5.5 7.0  --   HGB 6.3* 7.9* 8.3* 7.9*  HCT 21.0* 25.6* 27.2* 26.2*  MCV 68.0* 71.7* 70.5*  --   PLT 527* 485* 537*  --    Cardiac Enzymes: No results for input(s): CKTOTAL, CKMB, CKMBINDEX, TROPONINI in the last 168 hours. BNP: BNP (last 3 results) No results for input(s): BNP in the last 8760 hours.  ProBNP (last 3 results) No results for input(s): PROBNP in the last 8760 hours.  CBG:  Recent Labs Lab 02/19/17 1556  GLUCAP 83       Signed:  Nita Sells MD   [facility] 02/23/2017, 11:31 AM

## 2017-02-23 NOTE — Progress Notes (Signed)
Patient is discharged. No change in am assessment. Pt is a&ox4, ambulatory without assistance. Questions, concerns denied r/t instructions.

## 2017-02-23 NOTE — Progress Notes (Signed)
HGB 7.9, called to GI and hospitalist providers. No additional orders. Pt was encouraged to call to schedule post dc f/u visit for lab recheck. s&s reviewed. Pt denied dizziness and or weakness/bloody stools. Gait steady.

## 2017-02-23 NOTE — Care Management Note (Signed)
Case Management Note  Patient Details  Name: Kenneth Terrell MRN: [id number] Date of Birth: 03/20/1991  Subjective/Objective: 26 y/o m admitted w/GIB. From home.   No CM needs.                 Action/Plan:d/c home.   Expected Discharge Date:  02/23/17               Expected Discharge Plan:  Home/Self Care  In-House Referral:     Discharge planning Services  CM Consult  Post Acute Care Choice:    Choice offered to:     DME Arranged:    DME Agency:     HH Arranged:    HH Agency:     Status of Service:  Completed, signed off  If discussed at [employer] Length of Stay Meetings, dates discussed:    Additional Comments:  Lanier ClamMahabir, Aury Scollard, RN 02/23/2017, 11:36 AM

## 2017-02-23 NOTE — Telephone Encounter (Signed)
-----   Message from Leta BaptistJessica D Zehr, PA-C sent at 02/23/2017 10:14 AM EDT ----- Raynelle FanningJulie,  This patient is known to Dr. Myrtie Neitheranis.  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  He needs it to say that he was sick and in the hospital due to illness.  Needs it to start March 12th and I am going to put him out through April 5th.  Also, [facility] (that is why I am doing it through that date) so he needs his records faxed there.  If you could please fax Dr. Irving Burtonanis's notes, his hospital discharge summary, and our hospital notes.  He is going home today.  He is seeing Dr. Maeola SarahKaren Ama-Serwa Chachu, but I do not have a fax number.  I told him that he would need to get the note later this week so he said that he could come by on Friday.  Thank you,  Jess

## 2017-02-25 ENCOUNTER — Telehealth: Payer: Self-pay

## 2017-02-25 NOTE — Telephone Encounter (Signed)
Hospital D/C summary and consult note faxed to [facility] at [phone].

## 2017-03-18 ENCOUNTER — Telehealth: Payer: Self-pay | Admitting: Gastroenterology

## 2017-03-18 ENCOUNTER — Other Ambulatory Visit: Payer: Self-pay

## 2017-03-18 MED ORDER — PREDNISONE 10 MG PO TABS: ORAL_TABLET | ORAL | 0 refills | Status: DC

## 2017-03-18 NOTE — Telephone Encounter (Signed)
Pt. was in the hospital 3-16 to 02-23-2017 he was put on Prednisone  and was told to taper by  every week. He informed me today that he is on 25 mg a day staring today.Pt states today he has ran out of prednisone and has none left. He is requesting refills. The hospital instructed the pt to call us for medication. Please advise.

## 2017-03-18 NOTE — Telephone Encounter (Signed)
Thank you for letting me know.  Please prescribe prednisone 10 mg tablets. They can be broken in half to make the right dose.  Have him decrease to 20 mg once daily for 2 weeks, then 10 mg once daily for a week, then 5 mg once daily for a week, then stop. Dispense 40 tablets with no refills  If he stopped the Uceris while on prednisone , then he should resume it 2 weeks from now.  Looks like he has a lab appointment for next week to check his blood count since he was so anemic during recent hospital stay.  I had not made him a follow up appointment with me because the last I heard from him he decided to seek GI consultation at [facility].  If he did see them I have not rec'd a consult report.  What is happening with that?

## 2017-03-18 NOTE — Telephone Encounter (Signed)
Pt calling back regarding this wants to make sure since he does not have prednisone if it is okay if he takes later today. Pt's call back number is [phone].

## 2017-03-18 NOTE — Telephone Encounter (Signed)
Pt. notified and aware. He agrees to taper prednisone as directed. He states he had labs last week at [facility]. He will not do labs here next week. He doesn't see the point in having them repeated. He states he will go back on the Uceris in 2 weeks after completing the prednisone.  He has been to [facility], but still doesn't know what he wants to do for transferring care. He hasn't made up his mind.

## 2017-03-28 ENCOUNTER — Other Ambulatory Visit: Payer: Self-pay | Admitting: Family Medicine

## 2017-03-28 DIAGNOSIS — D500 Iron deficiency anemia secondary to blood loss (chronic): Secondary | ICD-10-CM

## 2017-03-28 DIAGNOSIS — Z113 Encounter for screening for infections with a predominantly sexual mode of transmission: Secondary | ICD-10-CM

## 2017-03-28 DIAGNOSIS — K51311 Ulcerative (chronic) rectosigmoiditis with rectal bleeding: Secondary | ICD-10-CM

## 2017-03-29 ENCOUNTER — Other Ambulatory Visit: Payer: BLUE CROSS/BLUE SHIELD

## 2017-04-06 ENCOUNTER — Ambulatory Visit (INDEPENDENT_AMBULATORY_CARE_PROVIDER_SITE_OTHER): Payer: BLUE CROSS/BLUE SHIELD | Admitting: Family Medicine

## 2017-04-06 ENCOUNTER — Encounter: Payer: Self-pay | Admitting: Family Medicine

## 2017-04-06 VITALS — BP 110/74 | HR 54 | Temp 97.6°F | Ht 70.0 in | Wt 196.8 lb

## 2017-04-06 DIAGNOSIS — Z0000 Encounter for general adult medical examination without abnormal findings: Secondary | ICD-10-CM | POA: Diagnosis not present

## 2017-04-06 DIAGNOSIS — D500 Iron deficiency anemia secondary to blood loss (chronic): Secondary | ICD-10-CM | POA: Diagnosis not present

## 2017-04-06 DIAGNOSIS — Z113 Encounter for screening for infections with a predominantly sexual mode of transmission: Secondary | ICD-10-CM | POA: Diagnosis not present

## 2017-04-06 DIAGNOSIS — K51311 Ulcerative (chronic) rectosigmoiditis with rectal bleeding: Secondary | ICD-10-CM | POA: Diagnosis not present

## 2017-04-06 LAB — CBC WITH DIFFERENTIAL/PLATELET
Basophils Absolute: 0.1 10*3/uL (ref 0.0–0.1)
Basophils Relative: 1 % (ref 0.0–3.0)
Eosinophils Absolute: 0.1 10*3/uL (ref 0.0–0.7)
Eosinophils Relative: 1.9 % (ref 0.0–5.0)
HCT: 36.7 % — ABNORMAL LOW (ref 39.0–52.0)
Hemoglobin: 11.4 g/dL — ABNORMAL LOW (ref 13.0–17.0)
Lymphocytes Relative: 26.7 % (ref 12.0–46.0)
Lymphs Abs: 2 10*3/uL (ref 0.7–4.0)
MCHC: 31 g/dL (ref 30.0–36.0)
MCV: 76.4 fl — ABNORMAL LOW (ref 78.0–100.0)
Monocytes Absolute: 0.8 10*3/uL (ref 0.1–1.0)
Monocytes Relative: 10.4 % (ref 3.0–12.0)
Neutro Abs: 4.4 10*3/uL (ref 1.4–7.7)
Neutrophils Relative %: 60 % (ref 43.0–77.0)
Platelets: 252 10*3/uL (ref 150.0–400.0)
RBC: 4.8 Mil/uL (ref 4.22–5.81)
RDW: 26.7 % — ABNORMAL HIGH (ref 11.5–15.5)
WBC: 7.4 10*3/uL (ref 4.0–10.5)

## 2017-04-06 LAB — COMPREHENSIVE METABOLIC PANEL
ALT: 41 U/L (ref 0–53)
AST: 53 U/L — ABNORMAL HIGH (ref 0–37)
Albumin: 4.4 g/dL (ref 3.5–5.2)
Alkaline Phosphatase: 68 U/L (ref 39–117)
BUN: 25 mg/dL — ABNORMAL HIGH (ref 6–23)
CO2: 30 mEq/L (ref 19–32)
Calcium: 9.7 mg/dL (ref 8.4–10.5)
Chloride: 101 mEq/L (ref 96–112)
Creatinine, Ser: 1.51 mg/dL — ABNORMAL HIGH (ref 0.40–1.50)
GFR: 71.74 mL/min (ref >60.00)
Glucose, Bld: 89 mg/dL (ref 70–99)
Potassium: 3.9 mEq/L (ref 3.5–5.1)
Sodium: 136 mEq/L (ref 135–145)
Total Bilirubin: 0.4 mg/dL (ref 0.2–1.2)
Total Protein: 7.3 g/dL (ref 6.0–8.3)

## 2017-04-06 LAB — TSH: TSH: 1.81 u[IU]/mL (ref 0.35–4.50)

## 2017-04-06 LAB — SEDIMENTATION RATE: Sed Rate: 7 mm/hr (ref 0–15)

## 2017-04-06 LAB — FERRITIN: Ferritin: 13.4 ng/mL — ABNORMAL LOW (ref 22.0–322.0)

## 2017-04-06 NOTE — Assessment & Plan Note (Signed)
Preventative protocols reviewed and updated unless pt declined. Discussed healthy diet and lifestyle.  

## 2017-04-06 NOTE — Assessment & Plan Note (Signed)
S/p recent hospitalization. Doing better after prednisone taper, feels well on current asacol dose. Stressed importance of compliance with medication.

## 2017-04-06 NOTE — Assessment & Plan Note (Signed)
Update CBC and ferritin.   

## 2017-04-06 NOTE — Patient Instructions (Addendum)
Labs today Urine check today. I'm glad you're feeling better. Continue healthy diet and lifestyle. Continue asacol daily for UC control.  Return as needed or in 1 year for next physical.   Health Maintenance, Male A healthy lifestyle and preventive care is important for your health and wellness. Ask your health care provider about what schedule of regular examinations is right for you. What should I know about weight and diet?  Eat a Healthy Diet  Eat plenty of vegetables, fruits, whole grains, low-fat dairy products, and lean protein.  Do not eat a lot of foods high in solid fats, added sugars, or salt. Maintain a Healthy Weight  Regular exercise can help you achieve or maintain a healthy weight. You should:  Do at least 150 minutes of exercise each week. The exercise should increase your heart rate and make you sweat (moderate-intensity exercise).  Do strength-training exercises at least twice a week. Watch Your Levels of Cholesterol and Blood Lipids  Have your blood tested for lipids and cholesterol every 5 years starting at 27 years of age. If you are at high risk for heart disease, you should start having your blood tested when you are 27 years old. You may need to have your cholesterol levels checked more often if:  Your lipid or cholesterol levels are high.  You are older than 27 years of age.  You are at high risk for heart disease. What should I know about cancer screening? Many types of cancers can be detected early and may often be prevented. Lung Cancer  You should be screened every year for lung cancer if:  You are a current smoker who has smoked for at least 30 years.  You are a former smoker who has quit within the past 15 years.  Talk to your health care provider about your screening options, when you should start screening, and how often you should be screened. Colorectal Cancer  Routine colorectal cancer screening usually begins at 27 years of age and  should be repeated every 5-10 years until you are 27 years old. You may need to be screened more often if early forms of precancerous polyps or small growths are found. Your health care provider may recommend screening at an earlier age if you have risk factors for colon cancer.  Your health care provider may recommend using home test kits to check for hidden blood in the stool.  A small camera at the end of a tube can be used to examine your colon (sigmoidoscopy or colonoscopy). This checks for the earliest forms of colorectal cancer. Prostate and Testicular Cancer  Depending on your age and overall health, your health care provider may do certain tests to screen for prostate and testicular cancer.  Talk to your health care provider about any symptoms or concerns you have about testicular or prostate cancer. Skin Cancer  Check your skin from head to toe regularly.  Tell your health care provider about any new moles or changes in moles, especially if:  There is a change in a mole's size, shape, or color.  You have a mole that is larger than a pencil eraser.  Always use sunscreen. Apply sunscreen liberally and repeat throughout the day.  Protect yourself by wearing long sleeves, pants, a wide-brimmed hat, and sunglasses when outside. What should I know about heart disease, diabetes, and high blood pressure?  If you are 58-22 years of age, have your blood pressure checked every 3-5 years. If you are 40 years of  age or older, have your blood pressure checked every year. You should have your blood pressure measured twice-once when you are at a hospital or clinic, and once when you are not at a hospital or clinic. Record the average of the two measurements. To check your blood pressure when you are not at a hospital or clinic, you can use:  An automated blood pressure machine at a pharmacy.  A home blood pressure monitor.  Talk to your health care provider about your target blood  pressure.  If you are between 45-79 years old, ask your health care provider if you should take aspirin to prevent heart disease.  Have regular diabetes screenings by checking your fasting blood sugar level.  If you are at a normal weight and have a low risk for diabetes, have this test once every three years after the age of 45.  If you are overweight and have a high risk for diabetes, consider being tested at a younger age or more often.  A one-time screening for abdominal aortic aneurysm (AAA) by ultrasound is recommended for men aged 65-75 years who are current or former smokers. What should I know about preventing infection? Hepatitis B  If you have a higher risk for hepatitis B, you should be screened for this virus. Talk with your health care provider to find out if you are at risk for hepatitis B infection. Hepatitis C  Blood testing is recommended for:  Everyone born from 1945 through 1965.  Anyone with known risk factors for hepatitis C. Sexually Transmitted Diseases (STDs)  You should be screened each year for STDs including gonorrhea and chlamydia if:  You are sexually active and are younger than 27 years of age.  You are older than 27 years of age and your health care provider tells you that you are at risk for this type of infection.  Your sexual activity has changed since you were last screened and you are at an increased risk for chlamydia or gonorrhea. Ask your health care provider if you are at risk.  Talk with your health care provider about whether you are at high risk of being infected with HIV. Your health care provider may recommend a prescription medicine to help prevent HIV infection. What else can I do?  Schedule regular health, dental, and eye exams.  Stay current with your vaccines (immunizations).  Do not use any tobacco products, such as cigarettes, chewing tobacco, and e-cigarettes. If you need help quitting, ask your health care provider.  Limit  alcohol intake to no more than 2 drinks per day. One drink equals 12 ounces of beer, 5 ounces of wine, or 1 ounces of hard liquor.  Do not use street drugs.  Do not share needles.  Ask your health care provider for help if you need support or information about quitting drugs.  Tell your health care provider if you often feel depressed.  Tell your health care provider if you have ever been abused or do not feel safe at home. This information is not intended to replace advice given to you by your health care provider. Make sure you discuss any questions you have with your health care provider. Document Released: 05/21/2008 Document Revised: 07/22/2016 Document Reviewed: 08/27/2015 Elsevier Interactive Patient Education  2017 Elsevier Inc.  

## 2017-04-06 NOTE — Progress Notes (Signed)
BP 110/74   Pulse (!) 54   Temp 97.6 F (36.4 C) (Oral)   Ht  (1.778 m)   Wt 196 lb 12 oz (89.2 kg)   BMI 28.23 kg/m    CC: CPE Subjective:    Patient ID: Kenneth Terrell, male    DOB: 02/10/1991, 26 y.o.   MRN: [id number]  HPI: Kenneth Terrell is a 26 y.o. male presenting on 04/06/2017 for Annual Exam   Just returned from trip to [place].  Home cooking - avoids fast and fried foods.   UC with h/o nonadherence to treatment plan, currently followed by Kenneth Terrell, has seen Kenneth Terrell for second opinion, planning to f/u with Kenneth Q37mo. Kenneth recommended asacol  TID and prednisone taper. Recent hospitalization 02/2017 with Hgb 6's, s/p 2u pRBC. Just completed prednisone taper. Bowels are formed and regular, no blood in stool, energy is doing well on current regimen.   Preventative: Currently sexually active with 1 partner, 2 in last year.  H/o STD - gonorrhea age 14 yo treated.  Flu - declines Tdap 2016 Seat belt use discussed. Sunscreen use discussed. No suspicious moles on skin. 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  Relevant past medical, surgical, family and social history reviewed and updated as indicated. Interim medical history since our last visit reviewed. Allergies and medications reviewed and updated. Outpatient Medications Prior to Visit  Medication Sig Dispense Refill  . ferrous sulfate 325 (65 FE) MG tablet Take 1 tablet (325 mg total) by mouth 2 (two) times daily with a meal. 30 tablet 3  . Mesalamine (ASACOL) 400 MG CPDR Kenneth capsule Take 4 capsules (1,600 mg total) by mouth 3 (three) times daily. 180 capsule 0  . mesalamine (CANASA) 1000 MG suppository Place 1 suppository (1,000 mg total) rectally at  bedtime. 30 suppository 12  . COLLAGEN PO Take 1 capsule by mouth daily.    Marland Kitchen GLUTAMINE PO Take 1 capsule by mouth 4 (four) times daily.    . Budesonide (UCERIS) 9 MG TB24 Take 9 mg by mouth daily.    . predniSONE (DELTASONE) 10 MG tablet 20 mg once daily for 2 weeks, then 10 mg once daily for a week, then 5 mg once daily for a week, then stop. (Patient not taking: Reported on 04/06/2017) 40 tablet 0  . predniSONE (DELTASONE) 20 MG tablet Take 2 tablets (40 mg total) by mouth daily with breakfast. (Patient not taking: Reported on 04/06/2017) 14 tablet 0  . predniSONE (DELTASONE) 5 MG tablet Take 7 tablets (35 mg total) by mouth daily with breakfast. (Patient not taking: Reported on 04/06/2017) 49 tablet 0  . predniSONE (DELTASONE) 5 MG tablet Take 6 tablets (30 mg total) by mouth daily with breakfast. (Patient not taking: Reported on 04/06/2017) 42 tablet 0  . Quercetin 250 MG TABS Take 500 mg by mouth 2 (two) times daily.     No facility-administered medications prior to visit.      Per HPI unless specifically indicated in ROS section below Review of Systems  Constitutional: Negative for activity change, appetite change, chills, fatigue, fever and unexpected weight change.  HENT: Negative for hearing loss.   Eyes: Negative for visual disturbance.  Respiratory: Negative for cough, chest tightness, shortness of breath  and wheezing.   Cardiovascular: Negative for chest pain, palpitations and leg swelling.  Gastrointestinal: Negative for abdominal distention, abdominal pain, blood in stool, constipation, diarrhea, nausea and vomiting.  Genitourinary: Negative for difficulty urinating and hematuria.  Musculoskeletal: Negative for arthralgias, myalgias and neck pain.  Skin: Negative for rash.  Neurological: Negative for dizziness, seizures, syncope and headaches.  Hematological: Negative for adenopathy. Does not bruise/bleed easily.  Psychiatric/Behavioral: Negative for dysphoric mood. The patient is  not nervous/anxious.        Objective:    BP 110/74   Pulse (!) 54   Temp 97.6 F (36.4 C) (Oral)   Ht  (1.778 m)   Wt 196 lb 12 oz (89.2 kg)   BMI 28.23 kg/m   Wt Readings from Last 3 Encounters:  04/06/17 196 lb 12 oz (89.2 kg)  02/19/17 184 lb 3.2 oz (83.6 kg)  01/21/17 195 lb (88.5 kg)    Physical Exam  Constitutional: He is oriented to person, place, and time. He appears well-developed and well-nourished. No distress.  HENT:  Head: Normocephalic and atraumatic.  Right Ear: Hearing, tympanic membrane, external ear and ear canal normal.  Left Ear: Hearing, tympanic membrane, external ear and ear canal normal.  Nose: Nose normal.  Mouth/Throat: Uvula is midline, oropharynx is clear and moist and mucous membranes are normal. No oropharyngeal exudate, posterior oropharyngeal edema or posterior oropharyngeal erythema.  Eyes: Conjunctivae and EOM are normal. Pupils are equal, round, and reactive to light. No scleral icterus.  Neck: Normal range of motion. Neck supple. No thyromegaly present.  Cardiovascular: Normal rate, regular rhythm, normal heart sounds and intact distal pulses.   No murmur heard. Pulses:      Radial pulses are 2+ on the right side, and 2+ on the left side.  Pulmonary/Chest: Effort normal and breath sounds normal. No respiratory distress. He has no wheezes. He has no rales.  Abdominal: Soft. Bowel sounds are normal. He exhibits no distension and no mass. There is no tenderness. There is no rebound and no guarding.  Musculoskeletal: Normal range of motion. He exhibits no edema.  Lymphadenopathy:    He has no cervical adenopathy.  Neurological: He is alert and oriented to person, place, and time.  CN grossly intact, station and gait intact  Skin: Skin is warm and dry. No rash noted.  Psychiatric: He has a normal mood and affect. His behavior is normal. Judgment and thought content normal.  Nursing note and vitals reviewed.      Assessment & Plan:    Problem List Items Addressed This Visit    Anemia due to blood loss    Update CBC and ferritin      Healthcare maintenance - Primary    Preventative protocols reviewed and updated unless pt declined. Discussed healthy diet and lifestyle.       Ulcerative colitis (HCC)    S/p recent hospitalization. Doing better after prednisone taper, feels well on current asacol dose. Stressed importance of compliance with medication.       Relevant Orders   Ferritin    Other Visit Diagnoses    Screen for STD (sexually transmitted disease)           Follow up plan: Return in about 1 year (around 04/06/2018) for annual exam, prior fasting for blood work.  Eustaquio Boyden, MD

## 2017-04-06 NOTE — Progress Notes (Signed)
Pre visit review using our clinic review tool, if applicable. No additional management support is needed unless otherwise documented below in the visit note. 

## 2017-04-07 LAB — HIV ANTIBODY (ROUTINE TESTING W REFLEX): HIV 1&2 Ab, 4th Generation: NONREACTIVE

## 2017-04-07 LAB — GC/CHLAMYDIA PROBE AMP
CT Probe RNA: NOT DETECTED
GC Probe RNA: NOT DETECTED

## 2017-04-07 LAB — RPR

## 2017-04-09 ENCOUNTER — Encounter: Payer: Self-pay | Admitting: *Deleted

## 2017-05-13 ENCOUNTER — Other Ambulatory Visit: Payer: Self-pay | Admitting: Gastroenterology

## 2017-05-18 ENCOUNTER — Telehealth: Payer: Self-pay

## 2017-05-18 NOTE — Telephone Encounter (Signed)
Kenneth Terrell,    Please call this patient. Review the telephone encounter of late April for details.  I never did get an answer on that.  This prescription appears to have been written in March by a hospital physician ( a supply  that should have long since run out.) He also saw a [facility] doctor in April, but it is not clear to me if they wrote a new script.  I need clarification on how often [name] has been taking this medicine and whether he plans to follow up with me or the [facility] doctors.  I do not object to a second opinion.  However, in the best interest of his medical care, he needs his medicine managed by one GI doctor.   Whatever he chooses is fine with me, but I need to know in order to provide him with optimal care.  If he plans to follow with me, then please set him an appointment to see me within 6 weeks.  When that is done, please refill this medication at 1600 mg by mouth three times daily, Disp 360 tablets with one refill.

## 2017-05-18 NOTE — Telephone Encounter (Signed)
Patient states [facility]. He wants to keep you as his primary GI doctor. He is heading out to [place] at the end of the week, but is scheduled for follow up appointment on 7/31. I have sent in the refill as directed on his Delzicol.

## 2017-07-06 ENCOUNTER — Ambulatory Visit: Payer: BLUE CROSS/BLUE SHIELD | Admitting: Gastroenterology

## 2017-07-20 ENCOUNTER — Encounter: Payer: Self-pay | Admitting: Internal Medicine

## 2017-07-20 ENCOUNTER — Ambulatory Visit (INDEPENDENT_AMBULATORY_CARE_PROVIDER_SITE_OTHER): Payer: BLUE CROSS/BLUE SHIELD | Admitting: Internal Medicine

## 2017-07-20 ENCOUNTER — Telehealth: Payer: Self-pay | Admitting: Family Medicine

## 2017-07-20 VITALS — BP 122/62 | HR 60 | Temp 98.1°F | Ht 70.0 in | Wt 204.0 lb

## 2017-07-20 DIAGNOSIS — M79604 Pain in right leg: Secondary | ICD-10-CM | POA: Diagnosis not present

## 2017-07-20 NOTE — Telephone Encounter (Signed)
Noted  

## 2017-07-20 NOTE — Telephone Encounter (Signed)
Pt has appt 07/20/17 at 1PM with Dr Burnice Logan.

## 2017-07-20 NOTE — Telephone Encounter (Signed)
Patient Name: Kenneth Terrell  DOB: May 06, 1991    Initial Comment Caller states he has pain that goes from ankle up to groin when he presses it.   Nurse Assessment  Nurse: Leilani Merl, RN, Heather Date/Time (Eastern Time): 07/20/2017 10:01:58 AM  Confirm and document reason for call. If symptomatic, describe symptoms. ---Caller states he has pain that goes from ankle up to groin when he presses it, he feels that it is from his ankle up his leg. He can also feel it when he bends his knee.  Does the patient have any new or worsening symptoms? ---Yes  Will a triage be completed? ---Yes  Related visit to physician within the last 2 weeks? ---No  Does the PT have any chronic conditions? (i.e. diabetes, asthma, etc.) ---Yes  List chronic conditions. ---See MR  Is this a behavioral health or substance abuse call? ---No     Guidelines    Guideline Title Affirmed Question Affirmed Notes  Leg Pain [1] Thigh or calf pain AND [2] only 1 side AND [3] present > 1 hour    Final Disposition User   See Physician within 4 Hours (or PCP triage) Leilani Merl, RN, Nira Conn    Comments  Appt scheduled with Dr. Raliegh Ip today at the [facility] office for 1 pm.   Referrals  REFERRED TO PCP OFFICE   Disagree/Comply: Comply

## 2017-07-20 NOTE — Patient Instructions (Signed)
Call or return to clinic prn if these symptoms worsen or fail to improve as anticipated  You  may move around, but avoid painful motions and activities.  Spent some extra time with stretching and warmups prior to vigorous activity

## 2017-07-20 NOTE — Progress Notes (Signed)
Subjective:    Patient ID: Kenneth Terrell, male    DOB: [birth year], 26 y.o.   MRN: [id number]  HPI  26 year old patient who  enjoys a very active lifestyle with frequent workouts at the gym as well as full-court basketball.  For the past few days he has had some discomfort involving the right medial leg from the mid thigh to the ankle area.  Pain is very minor and is aggravated by movement and relieved by rest.  Yesterday he continued his training at his local gym and spent 30 minutes on a treadmill at a significant incline without difficulty.  He did upper body weight training but not lower body.  He is anxious to participate in a basketball league tomorrow  Past Medical History:  Diagnosis Date  . ADHD 01/11/2008   3 YOA   . BIPOLAR DISORDER UNSPECIFIED 01/11/2008   actually thought more depression - ?misdiagnosis  . History of chlamydia 2008   treated  . Ulcerative colitis ([place]) 03/2016   by colonoscopy     Social History   Social History  . Marital status: Single    Spouse name: N/A  . Number of children: N/A  . Years of education: N/A   Occupational History  . Student Unemployed    Going to [employer]   Social History Main Topics  . Smoking status: Former Research scientist (life sciences)  . Smokeless tobacco: Never Used     Comment: quit 2011  . Alcohol use No  . Drug use: No  . Sexual activity: Yes    Partners: Female   Other Topics Concern  . Not on file   Social History Narrative   Lives with mother   Dad is in [place]//does not see on a regular basis   Hobbies; BB, FB   Graduated from [employer], [place], wants to be certified personal trainer - to go to school in [place]   Activity: gym 6d/wk, T25   Diet: lots of sweets, good water, fruits/vegetables seldom    Past Surgical History:  Procedure Laterality Date  . [facility] Admission  1999   x 1 week  . COLONOSCOPY  03/2016   Ulcerative colitis by biopsy ([place])  . EXTERNAL EAR SURGERY    . KELOID EXCISION  10/2007   left ear (Dr.  Towanda Malkin)    Family History  Problem Relation Age of Onset  . Drug abuse Father   . CAD Other        MI; angina  . Cancer Other        colon  . Diabetes Maternal Uncle   . Cancer Maternal Grandmother        lung cancer met. to brain  . Depression Maternal Grandmother   . Stroke Maternal Grandmother   . Colon cancer Maternal Grandmother     No Known Allergies  Current Outpatient Prescriptions on File Prior to Visit  Medication Sig Dispense Refill  . DELZICOL 400 MG CPDR DR capsule TAKE 4 CAPSULES BY MOUTH 3 TIMES A DAY 360 capsule 1  . ferrous sulfate 325 (65 FE) MG tablet Take 1 tablet (325 mg total) by mouth 2 (two) times daily with a meal. 30 tablet 3  . GLUTAMINE PO Take 1 capsule by mouth 4 (four) times daily.    . Mesalamine (ASACOL) 400 MG CPDR DR capsule Take 4 capsules (1,600 mg total) by mouth 3 (three) times daily. 180 capsule 0   No current facility-administered medications on file prior to visit.  BP 122/62 (BP Location: Left Arm, Patient Position: Sitting, Cuff Size: Normal)   Pulse 60   Temp 98.1 F (36.7 C) (Oral)   Ht _0  (1.778 m)   Wt 204 lb (92.5 kg)   SpO2 98%   BMI 29.27 kg/m     Review of Systems  Constitutional: Negative.   Musculoskeletal:       Right medial leg discomfort       Objective:   Physical Exam  Constitutional: He appears well-developed and well-nourished. No distress.  Abdominal:  The right groin area unremarkable.  No adenopathy or hernia  Musculoskeletal:  No abnormalities of the right leg.  No focal tenderness, erythema, soft tissue swelling Does not walk with a limp          Assessment & Plan:   Right leg pain.  Probable overuse injury.  Will observe at the present time.  Will avoid aggravating exercises and routines.  Will call if he develops any new or worsening symptoms or fails to resolve in a reasonable period of time  [place]

## 2017-08-02 ENCOUNTER — Other Ambulatory Visit: Payer: Self-pay | Admitting: Gastroenterology

## 2017-08-02 NOTE — Telephone Encounter (Signed)
Please call this patient.  He has not come to see me since January.  He went for another opinion at [facility] in the spring, but he told us he planned to see me afterward. He did not show for an appointment on 7/31 and did not call.  I am giving one last chance for Kenneth Terrell to decide if he wants me to help manage his colitis. If so, he will come for office visits.   I will refill this medication for a one month supply and one refill.  He needs to see me before those two months have passed or I will not prescribe medication.

## 2017-08-02 NOTE — Telephone Encounter (Signed)
Refill request for Delzicol 400 mg 4 caps tid. Last seen 01-05-2017. No show on 07-06-2017. Please advise on refills.

## 2017-08-03 NOTE — Telephone Encounter (Signed)
Spoke to Kenneth Kenneth Terrell. He agrees to keep the follow up we scheduled today to continue care and any further prescription refills. Scheduled a follow up for 09-16-2017 at 830am. Sooner appointment was offered. Kenneth Terrell states he will be out of the country and cant come sooner.

## 2017-09-15 ENCOUNTER — Encounter: Payer: Self-pay | Admitting: Family Medicine

## 2017-09-15 ENCOUNTER — Ambulatory Visit (INDEPENDENT_AMBULATORY_CARE_PROVIDER_SITE_OTHER): Payer: BLUE CROSS/BLUE SHIELD | Admitting: Family Medicine

## 2017-09-15 VITALS — BP 118/80 | HR 63 | Temp 98.3°F | Resp 16 | Ht 70.0 in | Wt 203.0 lb

## 2017-09-15 DIAGNOSIS — S0181XA Laceration without foreign body of other part of head, initial encounter: Secondary | ICD-10-CM

## 2017-09-15 NOTE — Patient Instructions (Signed)
Keep clean daily with soap and water Follow up for any signs of secondary infection such as redness, warmth, pus drainage, or increased swelling.

## 2017-09-15 NOTE — Progress Notes (Signed)
Subjective:     Patient ID: Kenneth Terrell, male   DOB: May 08, 1991, 26 y.o.   MRN: [id number]  HPI Patient seen with small laceration right lateral eyebrow region. Works as a Administrator. Around 5 PM yesterday he was taking down some type of [place] bird house and part of the bird house broke off and hit him against the right brow region. No loss of consciousness. He had a small split in the skin. Did not seek medical care immediately. Tetanus up-to-date. Minimal bleeding. Using OTC topical antibiotic.  Past Medical History:  Diagnosis Date  . ADHD 01/11/2008   3 YOA   . BIPOLAR DISORDER UNSPECIFIED 01/11/2008   actually thought more depression - ?misdiagnosis  . History of chlamydia 2008   treated  . Ulcerative colitis (HCC) 03/2016   by colonoscopy   Past Surgical History:  Procedure Laterality Date  . [facility]  1999   x 1 week  . COLONOSCOPY  03/2016   Ulcerative colitis by biopsy ([facility])  . EXTERNAL EAR SURGERY    . KELOID EXCISION  10/2007   left ear (Dr. Shon Hough)    reports that he has never smoked. He has never used smokeless tobacco. He reports that he does not drink alcohol or use drugs. family history includes CAD in his other; Cancer in his maternal grandmother and other; Colon cancer in his maternal grandmother; Depression in his maternal grandmother; Diabetes in his maternal uncle; Drug abuse in his father; Stroke in his maternal grandmother. No Known Allergies   Review of Systems  Constitutional: Negative for chills and fever.  Neurological: Negative for dizziness and headaches.       Objective:   Physical Exam  Constitutional: He appears well-developed and well-nourished.  Skin:  Superficial (non-gaping) horizontal oriented 1 cm laceration which is in the right lateral brow region. No foreign bodies. No surrounding erythema. No drainage. Nontender       Assessment:     Superficial right facial laceration almost 24 hours old with no signs of secondary  infection and no foreign body.  Tetanus up-to-date    Plan:     -Would not recommended any suturing (superficial,non-gaping, and almost 24 hours old). -Key clean with soap and water and watch closely for signs of secondary infection  Kristian Covey MD [facility]

## 2017-09-16 ENCOUNTER — Telehealth: Payer: Self-pay | Admitting: Gastroenterology

## 2017-09-16 ENCOUNTER — Ambulatory Visit: Payer: BLUE CROSS/BLUE SHIELD | Admitting: Gastroenterology

## 2017-09-16 NOTE — Telephone Encounter (Signed)
Earley again was a no-show for clinic today See my refill request/phone note from 8/27.  I am discharging Mr. Rossell from [facility] for noncompliance.  Please prepare a letter for me to sign.

## 2017-09-17 NOTE — Telephone Encounter (Signed)
Letter on your desk Please sign the letter and dismissal form on your desk.

## 2017-09-22 ENCOUNTER — Telehealth: Payer: Self-pay | Admitting: Gastroenterology

## 2017-09-22 NOTE — Telephone Encounter (Signed)
Patient dismissed from [facility] by Wilfrid Lund III MD , effective September 17, 2017. Dismissal letter sent out by certified / registered mail.  daj

## 2017-12-13 NOTE — Telephone Encounter (Signed)
Certified dismissal letter returned as undeliverable, unclaimed, return to sender after three attempts by [employer]PS on December 13, 2017. Letter placed in another envelope and resent as 1st class mail which does not require a signature. daj

## 2017-12-13 NOTE — Telephone Encounter (Signed)
Understood - thanks for the information. He is discharged from [facility].

## 2018-03-28 ENCOUNTER — Ambulatory Visit: Payer: Self-pay | Admitting: *Deleted

## 2018-03-28 ENCOUNTER — Ambulatory Visit (INDEPENDENT_AMBULATORY_CARE_PROVIDER_SITE_OTHER): Payer: BLUE CROSS/BLUE SHIELD | Admitting: Internal Medicine

## 2018-03-28 ENCOUNTER — Telehealth: Payer: Self-pay | Admitting: *Deleted

## 2018-03-28 ENCOUNTER — Encounter: Payer: Self-pay | Admitting: Internal Medicine

## 2018-03-28 VITALS — BP 108/80 | HR 56 | Temp 97.8°F | Wt 197.5 lb

## 2018-03-28 DIAGNOSIS — K51811 Other ulcerative colitis with rectal bleeding: Secondary | ICD-10-CM | POA: Diagnosis not present

## 2018-03-28 DIAGNOSIS — S61212A Laceration without foreign body of right middle finger without damage to nail, initial encounter: Secondary | ICD-10-CM
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
## 2018-03-28 NOTE — Patient Instructions (Signed)
Local care signs of infection    Get appt with pcp  Gi about the UC Iron is otc

## 2018-03-28 NOTE — Telephone Encounter (Signed)
Patient has appt w/ Dr. Fabian SharpPanosh today for a cut on his finger.  Dr. Fabian SharpPanosh requested triage be completed to determine what happened, is he having active bleeding, etc. Called patient and left message to return call for triage.

## 2018-03-28 NOTE — Telephone Encounter (Signed)
Pt called because he punched a wall last night and cut his right hand. He hand is swollen and he states some of the skin is off his hand. The cut is going vertical to his knuckle. He cleaned it last night with rinsing it with cold water and a putting a band aid on it. Bleeding is minimal now, states just looks wet. No fever. He denies pain. Appointment scheduled for today at 3:30 with a provider.   Reason for Disposition . Large swelling or bruise (> 2 inches or 5 cm)  Answer Assessment - Initial Assessment Questions 1. MECHANISM: "How did the injury happen?"     Punched the wall with your right hand 2. ONSET: "When did the injury happen?" (Minutes or hours ago)      Last night 3. APPEARANCE of INJURY: "What does the injury look like?"      Skin is off around the knuckles and looks wet blood  4. SEVERITY: "Can you use the hand normally?" "Can you bend your fingers into a ball and then fully open them?"     No not normal and can bend them but now has a band aid on it 5. SIZE: For cuts, bruises, or swelling, ask: "How large is it?" (e.g., inches or centimeters;  entire hand or wrist)      Going vertical and stops at the knuckles, v type of cut and swelling 6. PAIN: "Is there pain?" If so, ask: "How bad is the pain?"  (Scale 1-10; or mild, moderate, severe)     No pain 7. TETANUS: For any breaks in the skin, ask: "When was the last tetanus booster?"     Not sure 8. OTHER SYMPTOMS: "Do you have any other symptoms?"      No other symptoms 9. PREGNANCY: "Is there any chance you are pregnant?" "When was your last menstrual period?"     no  Protocols used: HAND AND WRIST INJURY-A-AH

## 2018-03-30 NOTE — Telephone Encounter (Signed)
Triage completed 03/28/18. See nurse triage encounter.

## 2018-04-21 ENCOUNTER — Encounter: Payer: Self-pay | Admitting: Family Medicine

## 2018-04-21 ENCOUNTER — Ambulatory Visit (INDEPENDENT_AMBULATORY_CARE_PROVIDER_SITE_OTHER): Payer: BLUE CROSS/BLUE SHIELD | Admitting: Family Medicine

## 2018-04-21 VITALS — BP 120/70 | HR 50 | Temp 97.9°F | Ht 70.0 in | Wt 196.5 lb

## 2018-04-21 DIAGNOSIS — K51919 Ulcerative colitis, unspecified with unspecified complications: Secondary | ICD-10-CM | POA: Diagnosis not present

## 2018-04-21 DIAGNOSIS — Z0000 Encounter for general adult medical examination without abnormal findings: Secondary | ICD-10-CM

## 2018-04-21 DIAGNOSIS — Z113 Encounter for screening for infections with a predominantly sexual mode of transmission: Secondary | ICD-10-CM | POA: Diagnosis not present

## 2018-04-21 DIAGNOSIS — D500 Iron deficiency anemia secondary to blood loss (chronic): Secondary | ICD-10-CM | POA: Diagnosis not present

## 2018-04-21 LAB — CBC WITH DIFFERENTIAL/PLATELET
Basophils Absolute: 0 10*3/uL (ref 0.0–0.1)
Basophils Relative: 0.9 % (ref 0.0–3.0)
Eosinophils Absolute: 0.4 10*3/uL (ref 0.0–0.7)
Eosinophils Relative: 7.9 % — ABNORMAL HIGH (ref 0.0–5.0)
HCT: 41.9 % (ref 39.0–52.0)
Hemoglobin: 14.8 g/dL (ref 13.0–17.0)
Lymphocytes Relative: 35.9 % (ref 12.0–46.0)
Lymphs Abs: 2 10*3/uL (ref 0.7–4.0)
MCHC: 35.2 g/dL (ref 30.0–36.0)
MCV: 91.5 fl (ref 78.0–100.0)
Monocytes Absolute: 0.3 10*3/uL (ref 0.1–1.0)
Monocytes Relative: 6.2 % (ref 3.0–12.0)
Neutro Abs: 2.7 10*3/uL (ref 1.4–7.7)
Neutrophils Relative %: 49.1 % (ref 43.0–77.0)
Platelets: 238 10*3/uL (ref 150.0–400.0)
RBC: 4.58 Mil/uL (ref 4.22–5.81)
RDW: 12.6 % (ref 11.5–15.5)
WBC: 5.4 10*3/uL (ref 4.0–10.5)

## 2018-04-21 LAB — COMPREHENSIVE METABOLIC PANEL
ALT: 25 U/L (ref 0–53)
AST: 29 U/L (ref 0–37)
Albumin: 4.6 g/dL (ref 3.5–5.2)
Alkaline Phosphatase: 82 U/L (ref 39–117)
BUN: 19 mg/dL (ref 6–23)
CO2: 31 mEq/L (ref 19–32)
Calcium: 9.8 mg/dL (ref 8.4–10.5)
Chloride: 100 mEq/L (ref 96–112)
Creatinine, Ser: 1.43 mg/dL (ref 0.40–1.50)
GFR: 75.8 mL/min (ref >60.00)
Glucose, Bld: 90 mg/dL (ref 70–99)
Potassium: 4.4 mEq/L (ref 3.5–5.1)
Sodium: 137 mEq/L (ref 135–145)
Total Bilirubin: 0.8 mg/dL (ref 0.2–1.2)
Total Protein: 7.6 g/dL (ref 6.0–8.3)

## 2018-04-21 LAB — FERRITIN: Ferritin: 45.3 ng/mL (ref 22.0–322.0)

## 2018-04-21 LAB — TSH: TSH: 1.28 u[IU]/mL (ref 0.35–4.50)

## 2018-04-21 MED ORDER — FERROUS SULFATE 325 (65 FE) MG PO TABS: 325.0000 mg | ORAL_TABLET | Freq: Two times a day (BID) | ORAL | 3 refills | Status: DC

## 2018-04-21 NOTE — Assessment & Plan Note (Signed)
Update CBC, ferritin.  

## 2018-04-21 NOTE — Assessment & Plan Note (Signed)
Preventative protocols reviewed and updated unless pt declined. Discussed healthy diet and lifestyle.  

## 2018-04-21 NOTE — Patient Instructions (Addendum)
You are due for eye exam and dental exam We will refer you to [facility].  Labs today.  Return as needed or in 1 year for next physical.   Health Maintenance, Male A healthy lifestyle and preventive care is important for your health and wellness. Ask your health care provider about what schedule of regular examinations is right for you. What should I know about weight and diet? Eat a Healthy Diet  Eat plenty of vegetables, fruits, whole grains, low-fat dairy products, and lean protein.  Do not eat a lot of foods high in solid fats, added sugars, or salt.  Maintain a Healthy Weight Regular exercise can help you achieve or maintain a healthy weight. You should:  Do at least 150 minutes of exercise each week. The exercise should increase your heart rate and make you sweat (moderate-intensity exercise).  Do strength-training exercises at least twice a week.  Watch Your Levels of Cholesterol and Blood Lipids  Have your blood tested for lipids and cholesterol every 5 years starting at 28 years of age. If you are at high risk for heart disease, you should start having your blood tested when you are 28 years old. You may need to have your cholesterol levels checked more often if: ? Your lipid or cholesterol levels are high. ? You are older than 28 years of age. ? You are at high risk for heart disease.  What should I know about cancer screening? Many types of cancers can be detected early and may often be prevented. Lung Cancer  You should be screened every year for lung cancer if: ? You are a current smoker who has smoked for at least 30 years. ? You are a former smoker who has quit within the past 15 years.  Talk to your health care provider about your screening options, when you should start screening, and how often you should be screened.  Colorectal Cancer  Routine colorectal cancer screening usually begins at 28 years of age and should be repeated every 5-10 years until you are 28  years old. You may need to be screened more often if early forms of precancerous polyps or small growths are found. Your health care provider may recommend screening at an earlier age if you have risk factors for colon cancer.  Your health care provider may recommend using home test kits to check for hidden blood in the stool.  A small camera at the end of a tube can be used to examine your colon (sigmoidoscopy or colonoscopy). This checks for the earliest forms of colorectal cancer.  Prostate and Testicular Cancer  Depending on your age and overall health, your health care provider may do certain tests to screen for prostate and testicular cancer.  Talk to your health care provider about any symptoms or concerns you have about testicular or prostate cancer.  Skin Cancer  Check your skin from head to toe regularly.  Tell your health care provider about any new moles or changes in moles, especially if: ? There is a change in a mole's size, shape, or color. ? You have a mole that is larger than a pencil eraser.  Always use sunscreen. Apply sunscreen liberally and repeat throughout the day.  Protect yourself by wearing long sleeves, pants, a wide-brimmed hat, and sunglasses when outside.  What should I know about heart disease, diabetes, and high blood pressure?  If you are 35-39 years of age, have your blood pressure checked every 3-5 years. If you are  14 years of age or older, have your blood pressure checked every year. You should have your blood pressure measured twice-once when you are at a hospital or clinic, and once when you are not at a hospital or clinic. Record the average of the two measurements. To check your blood pressure when you are not at a hospital or clinic, you can use: ? An automated blood pressure machine at a pharmacy. ? A home blood pressure monitor.  Talk to your health care provider about your target blood pressure.  If you are between 2-14 years old, ask your  health care provider if you should take aspirin to prevent heart disease.  Have regular diabetes screenings by checking your fasting blood sugar level. ? If you are at a normal weight and have a low risk for diabetes, have this test once every three years after the age of 74. ? If you are overweight and have a high risk for diabetes, consider being tested at a younger age or more often.  A one-time screening for abdominal aortic aneurysm (AAA) by ultrasound is recommended for men aged 65-75 years who are current or former smokers. What should I know about preventing infection? Hepatitis B If you have a higher risk for hepatitis B, you should be screened for this virus. Talk with your health care provider to find out if you are at risk for hepatitis B infection. Hepatitis C Blood testing is recommended for:  Everyone born from [birth year] through 1966.  Anyone with known risk factors for hepatitis C.  Sexually Transmitted Diseases (STDs)  You should be screened each year for STDs including gonorrhea and chlamydia if: ? You are sexually active and are younger than 28 years of age. ? You are older than 28 years of age and your health care provider tells you that you are at risk for this type of infection. ? Your sexual activity has changed since you were last screened and you are at an increased risk for chlamydia or gonorrhea. Ask your health care provider if you are at risk.  Talk with your health care provider about whether you are at high risk of being infected with HIV. Your health care provider may recommend a prescription medicine to help prevent HIV infection.  What else can I do?  Schedule regular health, dental, and eye exams.  Stay current with your vaccines (immunizations).  Do not use any tobacco products, such as cigarettes, chewing tobacco, and e-cigarettes. If you need help quitting, ask your health care provider.  Limit alcohol intake to no more than 2 drinks per day. One  drink equals 12 ounces of beer, 5 ounces of wine, or 1 ounces of hard liquor.  Do not use street drugs.  Do not share needles.  Ask your health care provider for help if you need support or information about quitting drugs.  Tell your health care provider if you often feel depressed.  Tell your health care provider if you have ever been abused or do not feel safe at home. This information is not intended to replace advice given to you by your health care provider. Make sure you discuss any questions you have with your health care provider. Document Released: 05/21/2008 Document Revised: 07/22/2016 Document Reviewed: 08/27/2015 Elsevier Interactive Patient Education  [employer].

## 2018-04-21 NOTE — Assessment & Plan Note (Addendum)
Has not followed up regularly with GI, not regular with mesalamine use. Advised he restart this daily- has enough at home. Was seeing GI in [place] he liked, but ended up not moving there. Would like referral to [facility] to establish care.

## 2018-04-21 NOTE — Progress Notes (Signed)
BP 120/70 (BP Location: Left Arm, Patient Position: Sitting, Cuff Size: Normal)   Pulse (!) 50   Temp 97.9 F (36.6 C) (Oral)   Ht  (1.778 m)   Wt 196 lb 8 oz (89.1 kg)   SpO2 97%   BMI 28.19 kg/m    CC: CPE Subjective:    Patient ID: Kenneth Terrell, male    DOB: 1991/02/07, 27 y.o.   MRN: [id number]  HPI: Kenneth Terrell is a 27 y.o. male presenting on 04/21/2018 for Annual Exam   Has seen [facility] for acute visits over the past year, lives in [place].   Was taking protein/creatine supplement but not recently.  Ulcerative colitis - not taking mesalamine currently but when he does take it finds it is helpful. Still has some at home - advised to restart. Overdue for GI f/u. Did not keep f/u with [facility]. Did see [facility] temporarily.   Asks about fertility - he has gotten a girl pregnant recently, but prior in longterm relationship was not successful.   Preventative: Currently sexually active with 1 partner, 3 in last year.  H/o STD - gonorrhea age 6 yo treated.  Flu - declines Tdap 2016 Seat belt use discussed. Sunscreen use discussed. No suspicious moles on skin.  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  Relevant past medical, surgical, family and social history reviewed and updated as indicated. Interim medical history since our last visit reviewed. Allergies and medications reviewed and updated. Outpatient Medications Prior to Visit  Medication Sig Dispense Refill  . DELZICOL 400 MG CPDR DR capsule TAKE 4 CAPSULES BY MOUTH 3 TIMES A DAY 360 capsule 1  . ferrous sulfate 325 (65 FE) MG tablet Take 1 tablet (325 mg total) by mouth 2  (two) times daily with a meal. 30 tablet 3   No facility-administered medications prior to visit.      Per HPI unless specifically indicated in ROS section below Review of Systems  Constitutional: Negative for activity change, appetite change, chills, fatigue, fever and unexpected weight change.  HENT: Negative for hearing loss.   Eyes: Negative for visual disturbance.  Respiratory: Negative for cough, chest tightness, shortness of breath and wheezing.   Cardiovascular: Negative for chest pain, palpitations and leg swelling.  Gastrointestinal: Positive for blood in stool. Negative for abdominal distention, abdominal pain, constipation, diarrhea, nausea and vomiting.  Genitourinary: Negative for difficulty urinating and hematuria.  Musculoskeletal: Negative for arthralgias, myalgias and neck pain.  Skin: Negative for rash.  Neurological: Negative for dizziness, seizures, syncope and headaches.  Hematological: Negative for adenopathy. Does not bruise/bleed easily.  Psychiatric/Behavioral: Negative for dysphoric mood. The patient is not nervous/anxious.        Objective:    BP 120/70 (BP Location: Left Arm, Patient Position: Sitting, Cuff Size: Normal)   Pulse (!) 50   Temp 97.9 F (36.6 C) (Oral)   Ht  (1.778 m)   Wt 196 lb 8 oz (89.1 kg)   SpO2 97%   BMI 28.19 kg/m   Wt Readings from Last 3 Encounters:  04/21/18 196 lb 8 oz (89.1 kg)  03/28/18 197 lb 8 oz (89.6 kg)  09/15/17 203 lb (  92.1 kg)    Physical Exam  Constitutional: He is oriented to person, place, and time. He appears well-developed and well-nourished. No distress.  HENT:  Head: Normocephalic and atraumatic.  Right Ear: Hearing, tympanic membrane, external ear and ear canal normal.  Left Ear: Hearing, tympanic membrane, external ear and ear canal normal.  Nose: Nose normal.  Mouth/Throat: Uvula is midline, oropharynx is clear and moist and mucous membranes are normal. No oropharyngeal exudate, posterior  oropharyngeal edema or posterior oropharyngeal erythema.  Eyes: Pupils are equal, round, and reactive to light. Conjunctivae and EOM are normal. No scleral icterus.  Neck: Normal range of motion. Neck supple.  Cardiovascular: Normal rate, regular rhythm, normal heart sounds and intact distal pulses.  No murmur heard. Pulses:      Radial pulses are 2+ on the right side, and 2+ on the left side.  Pulmonary/Chest: Effort normal and breath sounds normal. No respiratory distress. He has no wheezes. He has no rales.  Abdominal: Soft. Bowel sounds are normal. He exhibits no distension and no mass. There is no tenderness. There is no rebound and no guarding.  Musculoskeletal: Normal range of motion. He exhibits no edema.  Lymphadenopathy:    He has no cervical adenopathy.  Neurological: He is alert and oriented to person, place, and time.  CN grossly intact, station and gait intact  Skin: Skin is warm and dry. No rash noted.  Psychiatric: He has a normal mood and affect. His behavior is normal. Judgment and thought content normal.  Nursing note and vitals reviewed.  Results for orders placed or performed in visit on 04/06/17  GC/Chlamydia Probe Amp  Result Value Ref Range   CT Probe RNA NOT DETECTED    GC Probe RNA NOT DETECTED   Comprehensive metabolic panel  Result Value Ref Range   Sodium 136 135 - 145 mEq/L   Potassium 3.9 3.5 - 5.1 mEq/L   Chloride 101 96 - 112 mEq/L   CO2 30 19 - 32 mEq/L   Glucose, Bld 89 70 - 99 mg/dL   BUN 25 (H) 6 - 23 mg/dL   Creatinine, Ser 1.61 (H) 0.40 - 1.50 mg/dL   Total Bilirubin 0.4 0.2 - 1.2 mg/dL   Alkaline Phosphatase 68 39 - 117 U/L   AST 53 (H) 0 - 37 U/L   ALT 41 0 - 53 U/L   Total Protein 7.3 6.0 - 8.3 g/dL   Albumin 4.4 3.5 - 5.2 g/dL   Calcium 9.7 8.4 - 09.6 mg/dL   GFR 04.54 >09.81 mL/min  TSH  Result Value Ref Range   TSH 1.81 0.35 - 4.50 uIU/mL  HIV antibody  Result Value Ref Range   HIV 1&2 Ab, 4th Generation NONREACTIVE NONREACTIVE   RPR  Result Value Ref Range   RPR Ser Ql NON REAC NON REAC  Sedimentation rate  Result Value Ref Range   Sed Rate 7 0 - 15 mm/hr  CBC with Differential/Platelet  Result Value Ref Range   WBC 7.4 4.0 - 10.5 K/uL   RBC 4.80 4.22 - 5.81 Mil/uL   Hemoglobin 11.4 (L) 13.0 - 17.0 g/dL   HCT 19.1 (L) 47.8 - 29.5 %   MCV 76.4 (L) 78.0 - 100.0 fl   MCHC 31.0 30.0 - 36.0 g/dL   RDW 62.1 (H) 30.8 - 65.7 %   Platelets 252.0 150.0 - 400.0 K/uL   Neutrophils Relative % 60.0 43.0 - 77.0 %   Lymphocytes Relative 26.7 12.0 - 46.0 %  Monocytes Relative 10.4 3.0 - 12.0 %   Eosinophils Relative 1.9 0.0 - 5.0 %   Basophils Relative 1.0 0.0 - 3.0 %   Neutro Abs 4.4 1.4 - 7.7 K/uL   Lymphs Abs 2.0 0.7 - 4.0 K/uL   Monocytes Absolute 0.8 0.1 - 1.0 K/uL   Eosinophils Absolute 0.1 0.0 - 0.7 K/uL   Basophils Absolute 0.1 0.0 - 0.1 K/uL   Anisocytosis Presence of (A) None  Ferritin  Result Value Ref Range   Ferritin 13.4 (L) 22.0 - 322.0 ng/mL      Assessment & Plan:   Problem List Items Addressed This Visit    Anemia due to blood loss    Update CBC, ferritin      Relevant Medications   ferrous sulfate 325 (65 FE) MG tablet   Healthcare maintenance - Primary    Preventative protocols reviewed and updated unless pt declined. Discussed healthy diet and lifestyle.       Ulcerative colitis (HCC)    Has not followed up regularly with GI, not regular with mesalamine use. Advised he restart this daily- has enough at home. Was seeing GI in [place] he liked, but ended up not moving there. Would like referral to [facility] to establish care.       Relevant Orders   Ferritin   CBC with Differential/Platelet   TSH   Comprehensive metabolic panel   Ambulatory referral to Gastroenterology    Other Visit Diagnoses    Screen for STD (sexually transmitted disease)       Relevant Orders   RPR   HIV antibody   C. trachomatis/N. gonorrhoeae RNA       Meds ordered this encounter  Medications  .  ferrous sulfate 325 (65 FE) MG tablet    Sig: Take 1 tablet (325 mg total) by mouth 2 (two) times daily with a meal.    Dispense:  30 tablet    Refill:  3   Orders Placed This Encounter  Procedures  . C. trachomatis/N. gonorrhoeae RNA  . Ferritin  . CBC with Differential/Platelet  . TSH  . Comprehensive metabolic panel  . RPR  . HIV antibody  . Ambulatory referral to Gastroenterology    Referral Priority:   Routine    Referral Type:   Consultation    Referral Reason:   Specialty Services Required    Number of Visits Requested:   1    Follow up plan: Return in about 1 year (around 04/22/2019) for annual exam, prior fasting for blood work.  Eustaquio Boyden, MD

## 2018-04-22 ENCOUNTER — Telehealth: Payer: Self-pay

## 2018-04-22 LAB — C. TRACHOMATIS/N. GONORRHOEAE RNA
C. trachomatis RNA, TMA: NOT DETECTED
N. gonorrhoeae RNA, TMA: NOT DETECTED

## 2018-04-22 LAB — RPR: RPR Ser Ql: NONREACTIVE

## 2018-04-22 LAB — HIV ANTIBODY (ROUTINE TESTING W REFLEX): HIV 1&2 Ab, 4th Generation: NONREACTIVE

## 2018-04-22 NOTE — Telephone Encounter (Signed)
Left message for patient to call Raimi Guillermo back in regards to a referral-Iban Utz V Marijo Quizon, RMA   

## 2018-04-26 ENCOUNTER — Telehealth: Payer: Self-pay

## 2018-04-26 NOTE — Telephone Encounter (Signed)
Copied from CRM [id number]. Topic: Referral - Status >> Apr 26, 2018  2:33 PM Crist Infante wrote: Reason for CRM: [facility] with [facility] called to inform pt was a no show today for their appt.

## 2018-04-27 NOTE — Telephone Encounter (Signed)
Did pt know about appt?  If so, would call and see what happened, ask him to call and reschedule. Thanks.

## 2018-04-27 NOTE — Telephone Encounter (Signed)
Yes I gave patient all of the information and advised him to call them directly if he needed to reschedule. I will reach out to patient to check on what happened.-Georgiann Neider V Delena Casebeer, RMA

## 2018-04-28 NOTE — Telephone Encounter (Signed)
Left message for patient to call me back-Anastasiya Estell Harpin, RMA

## 2018-05-04 NOTE — Telephone Encounter (Signed)
Have not been able to get in touch with patient still but I called his mother, Macie Burows, today and she will speak with patient and call me back about this.-Anastasiya V Hopkins, RMA

## 2018-05-11 ENCOUNTER — Ambulatory Visit: Payer: Self-pay

## 2018-05-11 NOTE — Telephone Encounter (Signed)
Pt. Reports he has been unable to be in touch with office because "my phone died." States he did not know about his GI appointment. Reports he has been passing blood with his stools x 2 weeks. Request another appointment. Will have referral coordinator call him. Answer Assessment - Initial Assessment Questions 1. APPEARANCE of BLOOD: "What color is it?" "Is it passed separately, on the surface of the stool, or mixed in with the stool?"      Bright red blood 2. AMOUNT: "How much blood was passed?"      Large amount 3. FREQUENCY: "How many times has blood been passed with the stools?"      Every BM 4. ONSET: "When was the blood first seen in the stools?" (Days or weeks)      2 weeks 5. DIARRHEA: "Is there also some diarrhea?" If so, ask: "How many diarrhea stools were passed in past 24 hours?"      Small amount 6. CONSTIPATION: "Do you have constipation?" If so, "How bad is it?"     No 7. RECURRENT SYMPTOMS: "Have you had blood in your stools before?" If so, ask: "When was the last time?" and "What happened that time?"      Yes 8. BLOOD THINNERS: "Do you take any blood thinners?" (e.g., Coumadin/warfarin, Pradaxa/dabigatran, aspirin)     No 9. OTHER SYMPTOMS: "Do you have any other symptoms?"  (e.g., abdominal pain, vomiting, dizziness, fever)     Feels weal, dizzy 10. PREGNANCY: "Is there any chance you are pregnant?" "When was your last menstrual period?"       N/A  Protocols used: RECTAL BLEEDING-A-AH

## 2018-05-11 NOTE — Telephone Encounter (Signed)
Spoke with patient. He stated that his phone broke and he had all the information about GI office and appointment on there and could not access it or call us back. I was able to reschedule him for tomorrow 05/12/2018 at 2:15 pm. Patient aware of all the information about the office and patient understood that he needs to keep this appointment or their office will not reschedule Danelle Earthlyagain-Venita Seng V Keili Hasten, RMA

## 2018-06-27 ENCOUNTER — Encounter: Payer: Self-pay | Admitting: Family Medicine

## 2018-06-27 ENCOUNTER — Ambulatory Visit (INDEPENDENT_AMBULATORY_CARE_PROVIDER_SITE_OTHER): Payer: BLUE CROSS/BLUE SHIELD | Admitting: Family Medicine

## 2018-06-27 VITALS — BP 124/84 | HR 55 | Temp 98.3°F | Ht 70.0 in

## 2018-06-27 DIAGNOSIS — M7660 Achilles tendinitis, unspecified leg: Secondary | ICD-10-CM | POA: Diagnosis not present

## 2018-06-27 MED ORDER — IBUPROFEN 800 MG PO TABS: 800.0000 mg | ORAL_TABLET | Freq: Three times a day (TID) | ORAL | 0 refills | Status: DC | PRN

## 2018-06-27 NOTE — Patient Instructions (Signed)
Achilles Tendon Rupture The Achilles tendon is a cord-like band that connects the muscles of your lower leg (calf) to your heel. An Achilles tendon rupture is an injury that involves a tear in this tendon. This tendon is the most common site of tendon tearing. What are the causes? This condition may be caused by:  Stress from a sudden stretching of the tendon. For example, this may occur when you land from a jump or when your heel drops down into a hole on uneven ground.  A hard, direct hit to the tendon.  Pushing off your foot forcefully, such as when sprinting, jumping, or changing direction while running.  What increases the risk? This condition is more likely to develop in:  Runners.  People who play sports that involve sprinting, running, or jumping.  People who play contact sports.  People with a weak Achilles tendon. Tendons can weaken from aging, repeat injuries, and chronic tendinitis.  Males who are 30-50 years of age, especially those who do not exercise regularly.  What are the signs or symptoms? Symptoms of this condition include:  Hearing a "pop" at the time of injury.  Severe, sudden pain in the back of the ankle.  Swelling and bruising.  Inability to actively point your toes down.  Pain when standing or walking.  A feeling of giving way when you step on the affected side.  How is this diagnosed? This condition is usually diagnosed with a physical exam. During the exam, your health care provider may:  Touch the tendon and the structures around it.  Squeeze your calf to see if your foot moves.  Ask you to point and flex your foot.  Sometimes, tests are done in addition to an exam. Tests may include:  An ultrasound.  An X-ray.  MRI.  How is this treated? This condition may be treated with:  Ice applied to the area.  Pain medicine.  Rest.  Crutches.  A cast, splint, or other device to keep the ankle from moving (keep it  immobilized).  Heel wedges to reduce the stretch on your tendon as it heals.  Surgery. This option may depend on your age and your activity level.  Follow these instructions at home: If you have a splint or brace:  Do not put pressure on any part of the splint until it is fully hardened. This may take several hours.  Wear the splint or brace as told by your health care provider. Remove it only as told by your health care provider.  Loosen the splint or brace if your toes tingle, become numb, or turn cold and blue.  Do not let your splint or brace get wet if it is not waterproof.  Keep the splint or brace clean. If you have a cast:  Do not put pressure on any part of the cast until it is fully hardened. This may take several hours.  Do not stick anything inside the cast to scratch your skin. Doing that increases your risk of infection.  Check the skin around the cast every day. Tell your health care provider about any concerns.  You may put lotion on dry skin around the edges of the cast. Do not put lotion on the skin underneath the cast.  Do not let your cast get wet if it is not waterproof.  Keep the cast clean. Bathing  Do not take baths, swim, or use a hot tub until your health care provider approves. Ask your health care provider if   you can take showers. You may only be allowed to take sponge baths for bathing.  If your cast, splint, or brace is not waterproof, cover it with a watertight covering when you take a bath or a shower. Managing pain, stiffness, and swelling  If directed, apply ice to the injured area. ? Put ice in a plastic bag. ? Place a towel between your skin and the bag. ? Leave the ice on for 20 minutes, 2-3 times a day.  Move your toes often to avoid stiffness and to lessen swelling.  Raise (elevate) the injured area above the level of your heart while you are sitting or lying down. Do not dangle your leg over a chair, couch, or bed. Driving  Do  not drive or operate heavy machinery while taking prescription pain medicine.  Ask your health care provider when it is safe to drive if you have a cast, splint, or brace on a leg or foot that you use for driving. Activity  Return to your normal activities as told by your health care provider. Ask your health care provider what activities are safe for you.  Do exercises only as told by your health care provider. General instructions  Do not use the injured limb to support your body weight until your health care provider says that you can. Use crutches as told by your health care provider.  Do not use any tobacco products, such as cigarettes, chewing tobacco, and e-cigarettes. Tobacco can delay bone healing. If you need help quitting, ask your health care provider.  Take over-the-counter and prescription medicines only as told by your health care provider.  Keep all follow-up visits as told by your health care provider. This is important. How is this prevented?  Warm up and stretch before being active.  Cool down and stretch after being active.  Give your body time to rest between periods of activity.  Make sure to use equipment that fits you.  Be safe and responsible while being active to avoid falls.  Each week, do at least 150 minutes of moderate-intensity exercise, such as brisk walking or water aerobics.  Spread your workouts over the whole week, instead of just working out intensely one or two days of the week.  Make slow, incremental changes in intensity, distance, or time for running or sporting activity.  Maintain physical fitness, including: ? Strength. ? Flexibility. ? Cardiovascular fitness. ? Endurance. Contact a health care provider if:  Your pain and swelling increase.  Your pain is not controlled with medicines.  You develop new, unexplained symptoms.  Your symptoms get worse.  You cannot move your toes or foot.  You develop warmth and swelling in  your foot.  You have an unexplained fever. This information is not intended to replace advice given to you by your health care provider. Make sure you discuss any questions you have with your health care provider. Document Released: 11/23/2005 Document Revised: 07/28/2016 Document Reviewed: 10/16/2015 Elsevier Interactive Patient Education  2018 [employer]vier Inc[employer]  

## 2018-06-27 NOTE — Progress Notes (Signed)
Patient: Kenneth SearBrandon L Cajuste MRN: [id number] DOB: 05/04/1991 PCP: Eustaquio BoydenGutierrez, Javier, MD     Subjective:  Chief Complaint  Patient presents with  . right heel injury    HPI: The patient is a 27 y.o. male who presents today for injury to right heel, c/o burning and throbbing in heel. 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 He hopped his left foot and pulled up on his right foot. He feels like he pulled his right heel. He had immediate burning and throbbing sensation. It was swollen and he cant really put pressure on his right foot. Pain rated as a 7.5/10. He has iced it. Not taken any medication. No recent antibiotic use. He plays a lot of basketball and football.   Review of Systems  Respiratory: Negative for shortness of breath.   Cardiovascular: Negative for chest pain.  Musculoskeletal: Positive for myalgias.       Pt has injury to right heel 06/27/18.  Burning and throbbing in heel  Neurological: Negative for dizziness and headaches.    Allergies Patient has No Known Allergies.  Past Medical History Patient  has a past medical history of ADHD (01/11/2008), BIPOLAR DISORDER UNSPECIFIED (01/11/2008), History of chlamydia (2008), and Ulcerative colitis (HCC) (03/2016).  Surgical History Patient  has a past surgical history that includes [facility] (1999); Keloid excision (10/2007); External ear surgery; and Colonoscopy (03/2016).  Family History Pateint's family history includes CAD in his other; Cancer in his maternal grandmother and other; Colon cancer in his maternal grandmother; Depression in his maternal grandmother; Diabetes in his maternal uncle; Drug abuse in his father; Stroke in his maternal grandmother.  Social History Patient  reports that he has never smoked. He has never used smokeless tobacco. He reports that he does not drink alcohol or use drugs.     Objective: Vitals:   06/27/18 1450  BP: 124/84  Pulse: (!) 55  Temp: 98.3 F (36.8 C)  TempSrc: Oral  SpO2: 98%  Height: 5\' 10"  (1.778 m)    Body mass index is 28.19 kg/m.  Physical Exam  Constitutional: He appears well-developed and well-nourished.  Musculoskeletal:  Right lower leg: edema at back of leg from superior aspect of heel to calf along achilles tendon. TTP over achilles insertion. Equivocal thompson test, appears to be decreased compared to other foot. Can not dorsi flex or extend foot. Valgus and varus movement intact. Walking with limp and in pain.   Vitals reviewed.      Assessment/plan: 1. Achilles tendon pain Rupture vs. Partial rupture vs. Sprain. Putting in walking boot and seeing sports med tomorrow for further eval. RICE and ibuprofen with food. Keep boot on unless sleeping until follows up with dr. Berline Choughrigby tomorrow.  - Ambulatory referral to Sports Medicine     Return for schedule new pt visit with Dr. Berline Choughigby.   Orland MustardAllison Sai Moura, MD [facility]   06/27/2018

## 2018-06-28 ENCOUNTER — Ambulatory Visit (INDEPENDENT_AMBULATORY_CARE_PROVIDER_SITE_OTHER): Payer: BLUE CROSS/BLUE SHIELD | Admitting: Sports Medicine

## 2018-06-28 ENCOUNTER — Ambulatory Visit: Payer: Self-pay

## 2018-06-28 ENCOUNTER — Encounter: Payer: Self-pay | Admitting: Sports Medicine

## 2018-06-28 ENCOUNTER — Telehealth: Payer: Self-pay | Admitting: Family Medicine

## 2018-06-28 VITALS — BP 104/78 | HR 51 | Ht 70.0 in | Wt 197.0 lb

## 2018-06-28 DIAGNOSIS — S86111A Strain of other muscle(s) and tendon(s) of posterior muscle group at lower leg level, right leg, initial encounter: Secondary | ICD-10-CM

## 2018-06-28 DIAGNOSIS — M7660 Achilles tendinitis, unspecified leg: Secondary | ICD-10-CM

## 2018-06-28 NOTE — Patient Instructions (Addendum)
Please perform the exercise program that we have prepared for you and gone over in detail on a daily basis.  In addition to the handout you were provided you can access your program through: [email or web address]   Your unique program code is:  [email or web address]

## 2018-06-28 NOTE — Procedures (Signed)
LIMITED MSK ULTRASOUND OF Right Calf Images were obtained and interpreted by myself, Teresa Coombs, DO  Images have been saved and stored to PACS system. Images obtained on: GE S7 Ultrasound machine  FINDINGS:   Achilles tendon is intact with normal-appearing architecture.  There is disruption of the musculotendinous junction midportion of the medial gastroc.  Small amount of hypoechoic change that is tracking down the Achilles tendon approximately halfway and this does correlate with the area of painful sono palpation.  IMPRESSION:  1. Grade 2 strain of the musculotendinous junction of the medial head of gastroc.

## 2018-06-28 NOTE — Progress Notes (Signed)
Kenneth Terrell. Kenneth Terrell Sports Medicine [facility] at [facility] [phone]  Kenneth Terrell - 27 y.o. male MRN [id number]  Date of birth: 04-04-1991  Visit Date: 06/28/2018  PCP: Kenneth Boyden, MD   Referred by: Kenneth Boyden, MD  Scribe(s) for today's visit: Kenneth Terrell, LAT, ATC  SUBJECTIVE:  Kenneth Terrell is here for Follow-up ([name] pain) Referred by Dr. Artis Terrell.    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 Artis Terrell yesterday and was placed in a walking boot.  He was prescribed IBU 800mg .   REVIEW OF SYSTEMS: Denies night time disturbances. Denies fevers, chills, or night sweats. Denies unexplained weight loss. Denies personal history of cancer. Denies changes in bowel or bladder habits. Denies recent unreported falls. Denies new or worsening dyspnea or wheezing. Denies headaches or dizziness.  Denies numbness, tingling or weakness  In the extremities.  Denies dizziness or presyncopal episodes Denies lower extremity edema    HISTORY & PERTINENT PRIOR DATA:  Prior History reviewed and updated per electronic medical record.  Significant/pertinent history, findings, studies include:  reports that he has never smoked. He has never used smokeless tobacco. No results for input(s): HGBA1C, LABURIC, CREATINE in the last 8760 hours. No specialty comments available. No problems updated.  OBJECTIVE:  VS:  HT:5\' 10"  (177.8 cm)   WT:197 lb (89.4 kg)  BMI:28.27    BP:104/78  HR:(Abnormal) 51bpm  TEMP: ( )  RESP:98 %   PHYSICAL EXAM: Constitutional: WDWN,  Non-toxic appearing. Psychiatric: Alert & appropriately interactive.  Not depressed or anxious appearing. Respiratory: No increased work of breathing.  Trachea Midline Eyes: Pupils are equal.  EOM intact without nystagmus.  No scleral icterus  Vascular Exam: warm to touch no edema  lower extremity neuro exam: unremarkable normal strength normal sensation  MSK Exam: Right calf is overall well aligned.  He has pain with palpation of the soleus muscle as well as the medial head of the gastroc, midportion.  He has pain with resisted plantarflexion in both straight knee and bent knee position but worse actually and a bent knee position.  There is pain with palpation of the soleus as well as Achilles however no pain at the avascular zone and no palpable step-off or tissue defect of the Achilles.   ASSESSMENT & PLAN:   1. Achilles tendon pain   2. Gastrocnemius strain, right, initial encounter     PLAN: Medial head of the gastroc strain, grade 2  Gentle therapeutic exercises recommended, 2 weeks of boot immobilization Discussed this will likely take up to 6 weeks to fully recover from the standpoint of tissue strength and longer for potential full improvement.  We will have him continue with the boot immobilization and work on gentle range of motion exercises in the interim.  Follow-up in 2 weeks to consider progressing his therapeutic exercises.  PROCEDURE NOTE: THERAPEUTIC EXERCISES (97110) 15 minutes spent for Therapeutic exercises as below and as referenced in the AVS.  This included exercises focusing on stretching, strengthening, with significant focus on eccentric aspects.   Proper technique shown and discussed handout in great detail with ATC.  All  questions were discussed and answered.   Long term goals include an improvement in range of motion, strength, endurance as well as avoiding reinjury. Frequency of visits is one time as determined during today's  office visit. Frequency of  exercises to be performed is as per handout.  EXERCISES REVIEWED:  Calf Stretching, 4 Way ankle  Follow-up: Return in about 2 weeks (around 07/12/2018).      Please see additional documentation for Objective, Assessment and Plan sections. Pertinent additional documentation may be included in corresponding procedure notes, imaging studies, problem based documentation and patient instructions. Please see these sections of the encounter for additional information regarding this visit.  CMA/ATC served as Neurosurgeonscribe during this visit. History, Physical, and Plan performed by medical provider. Documentation and orders reviewed and attested to.      Kenneth MewsMichael D Mathew Storck, DO    [facility] Physician

## 2018-06-28 NOTE — Telephone Encounter (Signed)
Copied from CRM [id number]. Topic: Quick Communication - See Telephone Encounter >> Jun 28, 2018  3:17 PM Tamela OddiMartin, Don'[place], NT wrote: CRM for notification. See Telephone encounter for: 06/28/18. Patient called and states that he seen Dr. Berline Choughigby today and he wanted to speak to the nurse. He states he wrote him out for a month and he talked to his insurance company and they are allowing him to be out for 6 weeks. He states can Dr. Berline Choughigby write him a note stating he is going to be out for 6 weeks instead of 4 weeks . If you have any questions please call patient CB# [phone]

## 2018-06-29 NOTE — Telephone Encounter (Signed)
Spoke with pt and advised that Dr. Berline Choughigby will not be in today until 1:00. I will speak with Dr. Berline Choughigby regarding request to extend disability when he returns.

## 2018-06-29 NOTE — Telephone Encounter (Signed)
Patient calling and states that he would like a call back from Dr Janeece Riggersigby's nurse. States that he has to have his work note in by 12 noon today. Please advise. CB#: [phone]

## 2018-06-30 NOTE — Telephone Encounter (Signed)
Spoke with patient yesterday and sent new note via MyChart.

## 2018-07-12 ENCOUNTER — Ambulatory Visit: Payer: Self-pay

## 2018-07-12 ENCOUNTER — Ambulatory Visit (INDEPENDENT_AMBULATORY_CARE_PROVIDER_SITE_OTHER): Payer: BLUE CROSS/BLUE SHIELD | Admitting: Sports Medicine

## 2018-07-12 ENCOUNTER — Encounter: Payer: Self-pay | Admitting: Sports Medicine

## 2018-07-12 VITALS — BP 110/80 | HR 52 | Ht 70.0 in | Wt 204.4 lb

## 2018-07-12 DIAGNOSIS — K51919 Ulcerative colitis, unspecified with unspecified complications: Secondary | ICD-10-CM | POA: Diagnosis not present

## 2018-07-12 DIAGNOSIS — S86111A Strain of other muscle(s) and tendon(s) of posterior muscle group at lower leg level, right leg, initial encounter: Secondary | ICD-10-CM | POA: Diagnosis not present

## 2018-07-12 DIAGNOSIS — M7660 Achilles tendinitis, unspecified leg: Secondary | ICD-10-CM

## 2018-07-12 MED ORDER — NITROGLYCERIN 0.2 MG/HR TD PT24: MEDICATED_PATCH | TRANSDERMAL | 1 refills | Status: DC

## 2018-07-12 NOTE — Procedures (Signed)
LIMITED MSK ULTRASOUND OF Right calf Images were obtained and interpreted by myself, Teresa Coombs, DO  Images have been saved and stored to PACS system. Images obtained on: GE S7 Ultrasound machine  FINDINGS:   Medial head of the gastroc shows improved tissue congruency with small amount of interstitial swelling and a small disruption of the medial fibers minimally.  Mild increased neovascularity  IMPRESSION:  1. healing medial gastroc

## 2018-07-12 NOTE — Patient Instructions (Addendum)
Please perform the exercise program that we have prepared for you and gone over in detail on a daily basis.  In addition to the handout you were provided you can access your program through: [email or web address]   Your unique program code is:  [id number]    I recommend you obtained a compression sleeve to help with your joint problems. There are many options on the market however I recommend obtaining a ankle Body Helix compression sleeve.  You can find information (including how to appropriate measure yourself for sizing) can be found at [email or web address] [email or web address].  Many of these products are health savings account (HSA) eligible.   You can use the compression sleeve at any time throughout the day but is most important to use while being active as well as for 2 hours post-activity.   It is appropriate to ice following activity with the compression sleeve in place.   Nitroglycerin Protocol   Apply 1/4 nitroglycerin patch to affected area daily.  Change position of patch within the affected area every 24 hours.  You may experience a headache during the first 1-2 weeks of using the patch, these should subside.  If you experience headaches after beginning nitroglycerin patch treatment, you may take your preferred over the counter pain reliever.  Another side effect of the nitroglycerin patch is skin irritation or rash related to patch adhesive.  Please notify our office if you develop more severe headaches or rash, and stop the patch.  Tendon healing with nitroglycerin patch may require 12 to 24 weeks depending on the extent of injury.  Men should not use if taking Viagra, Cialis, or Levitra.   Do not use if you have migraines or rosacea.

## 2018-07-12 NOTE — Progress Notes (Signed)
Kenneth Terrell. Kenneth Terrell, Kenneth Terrell at [facility] - 27 y.o. 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 Rogers Terrell yesterday and was placed in a walking boot.  He was prescribed IBU 839m.  07/12/2018: Compared to the last office visit, his previously described symptoms are improving, he reports that aching/throbbing has resolved. Now he feels more "pulling from the calf muscle". Sx seem to be worse when not wearing walking boot.  Current symptoms are mild & are nonradiating He has been wearing walking boot except at bedtime. He has not been taking IBU d/t ulcerative colitis. He has not been doing HEP because of the tightness around his achilles.    REVIEW OF SYSTEMS: Denies night time disturbances. Denies fevers, chills, or night sweats. Denies unexplained weight loss. Denies personal history of cancer. Denies changes in bowel or bladder habits. Denies recent unreported falls. Denies new or worsening dyspnea or wheezing. Denies headaches or dizziness.  Denies numbness, tingling or weakness  In the extremities.  Denies dizziness or presyncopal episodes Denies lower extremity edema    HISTORY:  Prior history reviewed and updated per electronic medical record.  Social  History   Occupational History  . Occupation: Kenneth Terrell UNEMPLOYED    Comment: [facility] [employer] Tobacco Use  . Smoking status: Never Smoker  . Smokeless tobacco: Never Used  Substance and Sexual Activity  . Alcohol use: No    Alcohol/week: 0.0 standard drinks  . Drug use: No  . 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 .   OBJECTIVE:  VS:  HT:5' 10"  (177.8 cm)   WT:204 lb 6.4 oz (92.7 kg)  BMI:29.33    BP:110/80  HR:(!) 52bpm  TEMP: ( )  RESP:98 %   PHYSICAL EXAM: CONSTITUTIONAL: Well-developed, Well-nourished and In no acute distress PSYCHIATRIC: Alert & appropriately interactive. and Not depressed or anxious appearing. RESPIRATORY: No increased work of breathing and Trachea Midline EYES: Pupils are equal., EOM intact without nystagmus. and No scleral icterus.  VASCULAR EXAM: Warm and well perfused NEURO: unremarkable  MSK Exam: Right calf  Well aligned, no significant deformity. No overlying skin changes. No focal bony tenderness TTP over Medial head of the gastroc minimally.  There is no focal palpable defect.   RANGE OF MOTION & STRENGTH  Good dorsiflexion and plantarflexion of the ankle   SPECIALITY TESTING:  Improved ability to toe walk without significant pain.  Plantarflexion strength is 5+/5.  Minimal pain with calf squeeze test.  No pain with Achilles squeeze.       ASSESSMENT   1. Achilles tendon pain   2. Gastrocnemius strain, right, initial encounter   3. Ulcerative colitis with complication, unspecified  location [facility])     PLAN:  Pertinent additional documentation may be included in corresponding procedure notes, imaging studies, problem based documentation and patient instructions.  Procedures:  . Discussed the foundation of treatment for this condition is physical therapy and/or daily (5-6 days/week) therapeutic exercises, focusing on core strengthening, coordination, neuromuscular control/reeducation.  Therapeutic exercises prescribed per procedure note.  Medications:  Meds ordered this encounter  Medications  . nitroGLYCERIN (NITRODUR - DOSED IN MG/24 HR) 0.2 mg/hr patch    Sig: Place 1/4 to 1/2 of a patch over affected region. Remove and replace once daily.  Slightly alter skin placement daily    Dispense:  30 patch    Refill:  1    For musculoskeletal purposes.  Okay to cut patch.   Discussion/Instructions: No problem-specific Assessment & Plan notes found for this encounter.  . Discussed options with the patient today including biologic treatment with topical nitroglycerin. Patient has no contraindications & understands the risks, benefits and intentions of treatment. Emphasized the importance of rotating sites as well as appropriate and expected adverse reactions including orthostasis, headache, adhesive sensitivity. Begin with 1/4 patch to the affected area. Okay to titrate to half a patch as tolerated. Sharlyne Cai protocol . Discussed red flag symptoms that warrant earlier emergent evaluation and patient voices understanding. . Activity modifications and the importance of avoiding exacerbating activities (limiting pain to no more than a 4 / 10 during or following activity) recommended and discussed.  Follow-up:  . Return in about 2 weeks (around 07/26/2018).  . If any lack of improvement consider: . referral to Physical Therapy . And/or further boot immobilization     CMA/ATC served as scribe during this visit. History, Physical, and Plan performed by medical  provider. Documentation and orders reviewed and attested to.      Gerda Diss, [facility] Physician

## 2018-07-12 NOTE — Progress Notes (Signed)
PROCEDURE NOTE: THERAPEUTIC EXERCISES (97110) 15 minutes spent for Therapeutic exercises as below and as referenced in the AVS.  This included exercises focusing on stretching, strengthening, with significant focus on eccentric aspects.   Proper technique shown and discussed handout in great detail with ATC.  All questions were discussed and answered.   Long term goals include an improvement in range of motion, strength, endurance as well as avoiding reinjury. Frequency of visits is one time as determined during today's  office visit. Frequency of exercises to be performed is as per handout.  EXERCISES REVIEWED: Alfredson Protocol achilles stretching, ice cup massage

## 2018-07-26 ENCOUNTER — Ambulatory Visit: Payer: BLUE CROSS/BLUE SHIELD | Admitting: Sports Medicine

## 2018-07-26 ENCOUNTER — Ambulatory Visit (INDEPENDENT_AMBULATORY_CARE_PROVIDER_SITE_OTHER): Payer: BLUE CROSS/BLUE SHIELD | Admitting: Sports Medicine

## 2018-07-26 ENCOUNTER — Encounter: Payer: Self-pay | Admitting: Sports Medicine

## 2018-07-26 VITALS — BP 110/80 | HR 52 | Ht 70.0 in | Wt 197.8 lb

## 2018-07-26 DIAGNOSIS — M7742 Metatarsalgia, left foot: Secondary | ICD-10-CM

## 2018-07-26 DIAGNOSIS — M7660 Achilles tendinitis, unspecified leg: Secondary | ICD-10-CM | POA: Diagnosis not present

## 2018-07-26 DIAGNOSIS — S86111A Strain of other muscle(s) and tendon(s) of posterior muscle group at lower leg level, right leg, initial encounter: Secondary | ICD-10-CM | POA: Diagnosis not present

## 2018-07-26 DIAGNOSIS — M79671 Pain in right foot: Secondary | ICD-10-CM | POA: Diagnosis not present

## 2018-07-26 DIAGNOSIS — M7741 Metatarsalgia, right foot: Secondary | ICD-10-CM

## 2018-07-26 DIAGNOSIS — M216X1 Other acquired deformities of right foot: Secondary | ICD-10-CM

## 2018-07-26 DIAGNOSIS — M9906 Segmental and somatic dysfunction of lower extremity: Secondary | ICD-10-CM

## 2018-07-26 NOTE — Progress Notes (Signed)
PROCEDURE NOTE : OSTEOPATHIC MANIPULATION The decision today to treat with Osteopathic Manipulative Therapy (OMT) was based on physical exam findings. Verbal consent was obtained following a discussion with the patient regarding the of risks, benefits and potential side effects, including an acute pain flare,post manipulation soreness and need for repeat treatments.     Contraindications to OMT: NONE  Manipulation was performed as below: Regions treated: Lower extremities OMT Techniques Used: HVLA, muscle energy, myofascial release and soft tissue    The patient tolerated the treatment well and reported Improved symptoms following treatment today. Patient was given medications, exercises, stretches and lifestyle modifications per AVS and verbally.   OSTEOPATHIC/STRUCTURAL EXAM:   Right midfoot with navicular dropped navicular. Right anterior tibia on talus

## 2018-07-26 NOTE — Patient Instructions (Signed)
Look into having your insurance company cover a set of custom orthotics.  The code is L3030 and there are 2 units.  You can call them  and ask if this is covered.  I am happy to do these for you at any time, you just need to let our front office schedulers know you would like an "orthotic appointment."  Please also make sure you bring athletic shoes with you on the day of your orthotic appointment or whatever shoes you plan to wear your orthotics in most frequently.

## 2018-07-26 NOTE — Progress Notes (Signed)
Kenneth Terrell. Kenneth Terrell, [facility] - 27 y.o. male MRN [id number]  Date of birth: 05-Feb-1991  Visit Date: 07/26/2018  PCP: [name], MD   Referred by: [name], MD  Scribe(s) for today's visit: Wendy Poet, LAT, ATC  SUBJECTIVE:  Kenneth Terrell is here for Follow-up (R gastroc strain) .    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 Rogers Blocker yesterday and was placed in a walking boot.  He was prescribed IBU 880m.  07/12/2018: Compared to the last office visit, his previously described symptoms are improving, he reports that aching/throbbing has resolved. Now he feels more "pulling from the calf muscle". Sx seem to be worse when not wearing walking boot.  Current symptoms are mild & are nonradiating He has been wearing walking boot except at bedtime. He has not been taking IBU d/t ulcerative colitis. He has not been doing HEP because of the tightness around his achilles.   07/26/2018: Compared to the last office visit on 07/12/18, his previously described R calf pain symptoms are improving w/ no pain noted.  He reports tightness in his R calf.  Pt states that his R calf feels about 70% improved. Current symptoms are mild & are nonradiating He has been using nitroglycerin patches and wearing his calf Body Helix sleeve.  He was given a HEP at his last visit and has been doing his exercises every other day.  R foot  His R foot pain symptoms INITIALLY: Began when he injured his R calf w/ no known MOI.  He  states that the top of his R foot along his 1st and 2nd MT feels bruised. Described as moderate nagging pain that feels like a bruise, nonradiating Worsened with walking Improved with rest Additional associated symptoms include: no swelling, no N/T noted in R dorsal foot    At this time symptoms show no change compared to onset.   REVIEW OF SYSTEMS: Denies night time disturbances. Denies fevers, chills, or night sweats. Denies unexplained weight loss. Denies personal history of cancer. Denies changes in bowel or bladder habits. Denies recent unreported falls. Denies new or worsening dyspnea or wheezing. Denies headaches or dizziness.  Denies numbness, tingling or weakness  In the extremities.  Denies dizziness or presyncopal episodes Denies lower extremity edema    HISTORY & PERTINENT PRIOR DATA:  Significant/pertinent history, findings, studies include:  reports that he has never smoked. He has never used smokeless tobacco. No results for input(s): HGBA1C, LABURIC, CREATINE in the last 8760 hours. No specialty comments available. No problems updated.  Otherwise prior history reviewed and updated per electronic medical record.    OBJECTIVE:  VS:  HT:5' 10"  (177.8 cm)   WT:197 lb 12.8 oz (89.7 kg)  BMI:28.38    BP:110/80  HR:(Abnormal) 52bpm  TEMP: ( )  RESP:96 %   PHYSICAL EXAM: CONSTITUTIONAL: Well-developed, Well-nourished and In no acute distress Alert & appropriately interactive. and Not depressed or anxious appearing. RESPIRATORY: No increased work of breathing and Trachea Midline EYES: Pupils are equal., EOM intact without nystagmus. and No scleral icterus.  Lower extremities: Warm and well perfused Pulses: DP Pulses: Bilaterally normal and symmetric PT Pulses: Bilaterally normal and symmetric Edema: No Pre-tibial edema and No significant swelling or edema Calf supple with no pain with squeeze (Negative Homans) NEURO: unremarkable  MSK Exam: Right  calf:: . Well aligned, no significant deformity. . No overlying skin changes. . TTP over Tenderness of the midportion of the medial head of the gastroc. Marland Kitchen Normal, non-painful Dorsiflexion, plantarflexion, inversion and eversion. .  .   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  There is poor midfoot motion.  PROCEDURES & DATA REVIEWED:  OMT performed per procedure note  ASSESSMENT   1. Achilles tendon pain   2. Gastrocnemius strain, right, initial encounter   3. Right foot pain   4. Loss of transverse plantar arch of right foot   5. Metatarsalgia of both feet   6. Somatic dysfunction of lower extremity     PLAN:    Slow return to activities as tolerated.  Work note provided today. Continue your home exercise program    Given his ongoing issues with foot pain and early claw toe deformities and transverse arch breakdown as well as midfoot pain will benefit from custom cushioned insoles.  We will follow-up with him to fabricate these at his convenience. Osteopathic manipulation was performed today based on physical exam findings.  Please see procedure note for further information including Osteopathic Exam findings No problem-specific Assessment & Plan notes found for this encounter.   Follow-up: Return for orthotics depending on your schedule.      Please see additional documentation for Objective, Assessment and Plan sections. Pertinent additional documentation may be included in corresponding procedure notes, imaging studies, problem based documentation and patient instructions. Please see these sections of the encounter for additional information regarding this visit.  CMA/ATC served as Education administrator during this visit. History, Physical, and Plan performed by medical provider. Documentation and orders reviewed and attested to.      Gerda Diss,  [facility] Physician

## 2018-07-27 ENCOUNTER — Ambulatory Visit: Payer: BLUE CROSS/BLUE SHIELD | Admitting: Sports Medicine

## 2018-07-27 DIAGNOSIS — Z0289 Encounter for other administrative examinations: Secondary | ICD-10-CM

## 2018-07-27 NOTE — Progress Notes (Deleted)
  Veverly FellsMichael D. Delorise Shinerigby, DO  [facility] [place] [phone]  Kenneth Terrell - 27 y.o. male [name]  Date of birth: 10/29/1991  Visit Date: 07/27/2018  PCP: Eustaquio BoydenGutierrez, Javier, MD   Referred by: Eustaquio BoydenGutierrez, Javier, MD  Scribe(s) for today's visit: Stevenson ClinchBrandy Samier Jaco, CMA  SUBJECTIVE:  Kenneth Terrell is here for No chief complaint on file.   HPI  REVIEW OF SYSTEMS: Denies night time disturbances. Denies fevers, chills, or night sweats. Denies unexplained weight loss. Denies personal history of cancer. Denies changes in bowel or bladder habits. Denies recent unreported falls. Denies new or worsening dyspnea or wheezing. Denies headaches or dizziness.  Denies numbness, tingling or weakness  In the extremities.  Denies dizziness or presyncopal episodes Denies lower extremity edema    HISTORY & PERTINENT PRIOR DATA:  Significant/pertinent history, findings, studies include:  reports that he has never smoked. He has never used smokeless tobacco. No results for input(s): HGBA1C, LABURIC, CREATINE in the last 8760 hours. No specialty comments available. No problems updated.  Otherwise prior history reviewed and updated per electronic medical record.  {LBSM:21311}  ***

## 2018-08-02 ENCOUNTER — Encounter: Payer: Self-pay | Admitting: Sports Medicine

## 2018-08-23 NOTE — Assessment & Plan Note (Signed)
Needs to avoid NSAIDs

## 2018-10-04 ENCOUNTER — Ambulatory Visit (INDEPENDENT_AMBULATORY_CARE_PROVIDER_SITE_OTHER): Payer: BLUE CROSS/BLUE SHIELD

## 2018-10-04 ENCOUNTER — Encounter: Payer: Self-pay | Admitting: Sports Medicine

## 2018-10-04 ENCOUNTER — Ambulatory Visit: Payer: Self-pay

## 2018-10-04 ENCOUNTER — Ambulatory Visit (INDEPENDENT_AMBULATORY_CARE_PROVIDER_SITE_OTHER): Payer: BLUE CROSS/BLUE SHIELD | Admitting: Sports Medicine

## 2018-10-04 VITALS — BP 110/86 | HR 51 | Ht 70.0 in | Wt 197.4 lb

## 2018-10-04 DIAGNOSIS — M25531 Pain in right wrist: Secondary | ICD-10-CM

## 2018-10-04 DIAGNOSIS — S63501A Unspecified sprain of right wrist, initial encounter: Secondary | ICD-10-CM | POA: Diagnosis not present

## 2018-10-04 DIAGNOSIS — K51919 Ulcerative colitis, unspecified with unspecified complications: Secondary | ICD-10-CM

## 2018-10-04 DIAGNOSIS — M25539 Pain in unspecified wrist: Secondary | ICD-10-CM | POA: Insufficient documentation

## 2018-10-04 MED ORDER — DICLOFENAC SODIUM 2 % TD SOLN: 1.0000 "application " | Freq: Two times a day (BID) | TRANSDERMAL | 2 refills | Status: DC

## 2018-10-04 MED ORDER — DICLOFENAC SODIUM 2 % TD SOLN: 1.0000 "application " | Freq: Two times a day (BID) | TRANSDERMAL | 0 refills | Status: AC

## 2018-10-04 NOTE — Progress Notes (Signed)
[name]. [name] [facility] [phone]  AMR STURTEVANT - 27 y.o. male MRN [id number]  Date of birth: 1991/11/30  Visit Date: 10/04/2018  PCP: Eustaquio Boyden, MD   Referred by: Eustaquio Boyden, MD   Scribe(s) for today's visit: Christoper Fabian, LAT, ATC  SUBJECTIVE:  [name] is here for Initial Assessment (R wrist pain)    HPI: His R wrist symptoms INITIALLY: Began last night after suffering a FOOSH and landing on the pavement. Described as 8/10 sharp pain w/ movement and 6/10 at rest, nonradiating Worsened with use of the R hand and gripping Improved with nothing noted Additional associated symptoms include: no swelling or mechanical symptoms in the R wrist; no N/T noted in the R hand.    At this time symptoms are improving compared to onset  He has been trying to ice but otherwise has not done anything to treat his wrist. VIEW OF SYSTEMS :Reports night time disturbances. Denies fevers, chills, or night sweats. Denies unexplained weight loss. Denies personal history of cancer. Denies changes in bowel or bladder habits. Reports recent unreported falls. Denies new or worsening dyspnea or wheezing. Denies headaches or dizziness.  Denies numbness, tingling or weakness  In the extremities.  Denies dizziness or presyncopal episodes Denies lower extremity edema    HISTORY:  Prior history reviewed and updated per electronic medical record.  Social History   Occupational History  . Occupation: Dentist: UNEMPLOYED    Comment: Going to [employer]  Tobacco Use  . Smoking status: Never Smoker  . Smokeless tobacco: Never Used  Substance and Sexual Activity  . Alcohol use: No    Alcohol/week: 0.0 standard drinks  . Drug use: No  . Sexual activity: Yes    Partners: Female   Social History   Social History Narrative   Lives with mother   Dad is in [place]//does not see on a regular basis   Hobbies; BB, FB   Graduated from [employer], [employer], wants to be certified Systems analyst - to go to school in [place]   Activity: gym 6d/wk, T25   Diet: lots of sweets, good water, fruits/vegetables seldom    DATA OBTAINED & REVIEWED:     04/21/18 1036  CALCIUM 9.8  AST 29  ALT 25  TSH 1.28   Problem  Wrist Pain   No specialty comments available.   OBJECTIVE:  VS:  HT:5\' 10"  (177.8 cm)   WT:197 lb 6.4 oz (89.5 kg)  BMI:28.32    BP:110/86  HR:(!) 51bpm  TEMP: ( )  RESP:97 %   PHYSICAL EXAM: CONSTITUTIONAL: Well-developed, Well-nourished and In no acute distress EYES: Pupils are equal., EOM intact without nystagmus. and No scleral icterus. Psychiatric: Alert & appropriately interactive. and Not depressed or anxious appearing. EXTREMITY EXAM: Warm and well perfused  Right wrist: Well aligned.  He does have a small amount of swelling directly over the entire wrist joint but no focal tenderness.  He has good grip strength.  Small amount of pain over the scapholunate ligament but no focal tenderness over the scaphoid itself.  He has good ulnar deviation and radial deviation.  ASSESSMENT   1. Right wrist pain   2. Right wrist sprain, initial encounter   3. Ulcerative colitis with complication, unspecified location [facility])      PROCEDURES:  None  PLAN:  Pertinent additional documentation may be included in corresponding procedure notes, imaging studies, problem based  documentation and patient instructions.  Wrist pain FOOSH injury on 10/03/18 X-rays today  X-rays are reassuring.  Consistent with a wrist sprain.  Prescribed NSAID.   RICE (Rest, ICE, Compression, Elevation) principles reviewed with the patient.  Activity modifications and the importance of avoiding exacerbating activities (limiting pain to no more than a 4 / 10 during or following activity) recommended and discussed.  Discussed red flag symptoms that warrant earlier emergent evaluation and patient  voices understanding.   Meds ordered this encounter  Medications  . Diclofenac Sodium (PENNSAID) 2 % SOLN    Sig: Place 1 application onto the skin 2 (two) times daily for 1 day.    Dispense:  8 g    Refill:  0  . Diclofenac Sodium (PENNSAID) 2 % SOLN    Sig: Place 1 application onto the skin 2 (two) times daily.    Dispense:  112 g    Refill:  2    Home Phone      [phone] Mobile          [phone]    Lab Orders  No laboratory test(s) ordered today   Imaging Orders     DG Wrist Complete Right Referral Orders  No referral(s) requested today   >50% of this 25 minute visit spent in direct patient counseling and/or coordination of care.  Discussion was focused on education regarding the in discussing the pathoetiology and anticipated clinical course of the above condition.  Return in about 3 weeks (around 10/25/2018) for repeat clinical exam.     CMA/ATC served as scribe during this visit. History, Physical, and Plan performed by medical provider. Documentation and orders reviewed and attested to.      Andrena Mews, DO    [place] [facility] Physician

## 2018-10-04 NOTE — Telephone Encounter (Signed)
Pt called with pain to his rt wrist after a fall on hard pavement last night. He deny's swelling or deformity to the wrist. He denies swelling to the hand and fingers. No open areas. He rates pain at 8. He states that he is unable to grip with the right hand. Pain is severe when trying to open a door. He sees no bruising to the wrist fingers or hand. Appointment made per protocol. Per patient request with Dr Berline Chough. Pt cautioned to check with insurance because some require referral. Pt States he will call back if he needs referral but prefers to see Dr Berline Chough for this injury. Care advice read to patient.  Pt verbalized understanding of instructions.    Reason for Disposition . [1] SEVERE pain AND [2] not improved 2 hours after pain medicine/ice packs  Answer Assessment - Initial Assessment Questions 1. ONSET: "When did the swelling start?" (e.g., minutes, hours, days, weeks)     No swelling 2. LOCATION: "What part of the wrist is swollen?"  "Are both wrists swollen or just one wrist?"     Fell on it last night 3. SEVERITY: "How bad is the swelling?"    - BALL OR LUMP: small ball or lump   - SKIN ONLY: localized; puffy or swollen area or patch of skin   - MILD JOINT SWELLING: joint feels or looks mildly swollen or puffy   - MODERATE JOINT SWELLING: moderate joint swelling; looks swollen   - SEVERE JOINT SWELLING:  severe joint swelling; can barely bend or move joint     With pain if moving or using Unable to grip 4. RECURRENT SYMPTOM: "Have you had wrist swelling before?" If so, ask: "When was the last time?" "What happened that time?"     No injury 5. CAUSE: "What do you think is causing the wrist swelling?" (e.g., arthritis, ganglion cyst, insect bite, recent injury)     fall 6. OTHER SYMPTOMS: "Do you have any other symptoms?" (e.g., fever, hand pain)     no 7. PREGNANCY: "Is there any chance you are pregnant?" "When was your last menstrual period?"    N/A  Answer Assessment - Initial  Assessment Questions 1. MECHANISM: "How did the injury happen?"     Last night fall 2. ONSET: "When did the injury happen?" (Minutes or hours ago)      hours 3. APPEARANCE of INJURY: "What does the injury look like?"      No swelling 4. SEVERITY: "Can you use the hand normally?" "Can you bend your fingers into a ball and then fully open them?"     severe 5. SIZE: For cuts, bruises, or swelling, ask: "How large is it?" (e.g., inches or centimeters;  entire hand or wrist)      no 6. PAIN: "Is there pain?" If so, ask: "How bad is the pain?"  (Scale 1-10; or mild, moderate, severe)     8 7. TETANUS: For any breaks in the skin, ask: "When was the last tetanus booster?"     no 8. OTHER SYMPTOMS: "Do you have any other symptoms?"      no 9. PREGNANCY: "Is there any chance you are pregnant?" "When was your last menstrual period?"     N/A  Protocols used: HAND AND WRIST INJURY-A-AH, WRIST [facility]

## 2018-10-04 NOTE — Assessment & Plan Note (Signed)
Ritchey injury on 10/03/18 X-rays today

## 2018-10-04 NOTE — Telephone Encounter (Signed)
Printed and given to brandy for app today.

## 2018-10-04 NOTE — Patient Instructions (Addendum)
Frequent icing is recommended.  You can ice for 10-15 minutes at a time 3-4 times per day if needed.  It is important to be sure to ice immediately after activity. For the hand and wrist it is sometimes easier and more effective to soak your hand in a bucket of cool water.   The water should not be miserably cold, a good rule to go by his if there is any ice floating by the end of the 10-15 minutes it was likely too cold.   Wear your brace most of the time  Use the Pennsaid twice daily for the next 2 weeks  Pennsaid instructions: You have been given a sample/prescription for Pennsaid, a topical medication.     You are to apply this gel to your injured body part twice daily (morning and evening).   A little goes a long way so you can use about a pea-sized amount for each area.   Spread this small amount over the area into a thin film and let it dry.   Be sure that you do not rub the gel into your skin for more than 10 or 15 seconds otherwise it can irritate you skin.    Once you apply the gel, please do not put any other lotion or clothing in contact with that area for 30 minutes to allow the gel to absorb into your skin.   Some people are sensitive to the medication and can develop a sunburn-like rash.  If you have only mild symptoms it is okay to continue to use the medication but if you have any breakdown of your skin you should discontinue its use and please let us know.   If you have been written a prescription for Pennsaid, you will receive a pump bottle of this topical gel through a mail order pharmacy.  The instructions on the bottle will say to apply two pumps twice a day which may be too much gel for your particular area so use the pea-sized amount as your guide.   Instructions for Duexis, Pennsaid and Vimovo:  Your prescription will be filled through a participating [facility] mail order pharmacy.  You will receive a phone call or text from one of the participating  pharmacies which can be located in any state in the [place].  You must communicate directly with them to have this medication filled.  When the pharmacy contacts you, they will need your mailing address (for shipment of the medication) andy they will need payment information if you have a copay (typically no more than $10). If you have not heard from them 2-3 days after your appointment with Dr. Berline Chough, contact [facility] directly at [phone].

## 2018-11-24 ENCOUNTER — Ambulatory Visit: Payer: BLUE CROSS/BLUE SHIELD | Admitting: Sports Medicine

## 2018-12-02 ENCOUNTER — Emergency Department (HOSPITAL_COMMUNITY)
Admission: EM | Admit: 2018-12-02 | Discharge: 2018-12-02 | Disposition: A | Discharge status: Home / Self Care | Payer: BLUE CROSS/BLUE SHIELD | Attending: Emergency Medicine | Admitting: Emergency Medicine

## 2018-12-02 ENCOUNTER — Encounter (HOSPITAL_COMMUNITY): Payer: Self-pay

## 2018-12-02 DIAGNOSIS — Z79899 Other long term (current) drug therapy: Secondary | ICD-10-CM | POA: Diagnosis not present

## 2018-12-02 DIAGNOSIS — K625 Hemorrhage of anus and rectum: Secondary | ICD-10-CM | POA: Diagnosis present

## 2018-12-02 DIAGNOSIS — K51911 Ulcerative colitis, unspecified with rectal bleeding: Secondary | ICD-10-CM | POA: Diagnosis not present

## 2018-12-02 DIAGNOSIS — N189 Chronic kidney disease, unspecified: Secondary | ICD-10-CM | POA: Diagnosis not present

## 2018-12-02 LAB — COMPREHENSIVE METABOLIC PANEL
ALT: 33 U/L (ref 0–44)
AST: 33 U/L (ref 15–41)
Albumin: 4.7 g/dL (ref 3.5–5.0)
Alkaline Phosphatase: 79 U/L (ref 38–126)
Anion gap: 8 (ref 5–15)
BUN: 13 mg/dL (ref 6–20)
CO2: 27 mmol/L (ref 22–32)
Calcium: 9.6 mg/dL (ref 8.9–10.3)
Chloride: 103 mmol/L (ref 98–111)
Creatinine, Ser: 1.43 mg/dL — ABNORMAL HIGH (ref 0.61–1.24)
GFR calc Af Amer: >60 mL/min (ref >60)
GFR calc non Af Amer: >60 mL/min (ref >60)
Glucose, Bld: 83 mg/dL (ref 70–99)
Potassium: 4 mmol/L (ref 3.5–5.1)
Sodium: 138 mmol/L (ref 135–145)
Total Bilirubin: 0.5 mg/dL (ref 0.3–1.2)
Total Protein: 7.8 g/dL (ref 6.5–8.1)

## 2018-12-02 LAB — CBC
HCT: 40.9 % (ref 39.0–52.0)
Hemoglobin: 13.7 g/dL (ref 13.0–17.0)
MCH: 31.4 pg (ref 26.0–34.0)
MCHC: 33.5 g/dL (ref 30.0–36.0)
MCV: 93.8 fL (ref 80.0–100.0)
Platelets: 261 10*3/uL (ref 150–400)
RBC: 4.36 MIL/uL (ref 4.22–5.81)
RDW: 12.1 % (ref 11.5–15.5)
WBC: 6.8 10*3/uL (ref 4.0–10.5)
nRBC: 0 % (ref 0.0–0.2)

## 2018-12-02 LAB — TYPE AND SCREEN
ABO/RH(D): B POS
Antibody Screen: NEGATIVE

## 2018-12-02 MED ORDER — PREDNISONE 20 MG PO TABS
40.0000 mg | ORAL_TABLET | Freq: Once | ORAL | Status: AC
  Administered 2018-12-02: 40 mg via ORAL
  Filled 2018-12-02: qty 2

## 2018-12-02 MED ORDER — PREDNISONE 10 MG PO TABS: 20.0000 mg | ORAL_TABLET | Freq: Every day | ORAL | 0 refills | Status: DC

## 2018-12-02 NOTE — ED Triage Notes (Signed)
Pt states that he has UC and has intermittent rectal bleeding for the last month, he denies any pain but states he very fatigued especially this week, today he had an episode of bright red blood with clots present, pt had blood work drawn last week but the results aren't posted

## 2018-12-02 NOTE — Discharge Instructions (Addendum)
Keep appointment with your gastroenterologist as you will need to have a prednisone taper given Return if you are having worsening bleeding, weakness, or passing out

## 2018-12-02 NOTE — ED Provider Notes (Signed)
[facility] DEPT Provider Note   CSN: [id number] Arrival date & time: 12/02/18  1926     History   Chief Complaint Chief Complaint  Patient presents with  . Rectal Bleeding    HPI Kenneth Terrell is a 27 y.o. male.  HPI  27 yo male ho ulcerative colitis presents complaining of rectal bleeding began 2 months ago.  States bleeding occurs at the end of bowel movements with most bowel movements.  Patient had 3- 5 bloody bowel movements that began last night.  Reports taking meds as prescribed.  Called MD last Friday and seen December 23- blood drawn and scheduled f/u January 6.  Deneis fever, some fatigue, denies pain.  5 bm/day usually loose [facility] Patient required previous blood transfusion Diagnosed 2 years ago  Past Medical History:  Diagnosis Date  . ADHD 01/11/2008   3 YOA   . BIPOLAR DISORDER UNSPECIFIED 01/11/2008   actually thought more depression - ?misdiagnosis  . History of chlamydia 2008   treated  . Ulcerative colitis ([place]) 03/2016   by colonoscopy    Patient Active Problem List   Diagnosis Date Noted  . Wrist pain 10/04/2018  . Anemia due to blood loss 02/19/2017  . 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 Ulcerative colitis ([place]) 03/07/2016  . Genital warts 12/26/2013  . Healthcare maintenance 01/16/2013  . Molluscum contagiosum 01/16/2013  . Gynecomastia 10/19/2012  . BIPOLAR DISORDER UNSPECIFIED 01/11/2008  . Attention deficit hyperactivity disorder (ADHD) 01/11/2008    Past Surgical History:  Procedure Laterality Date  . [facility] Admission  1999   x 1 week  . COLONOSCOPY  03/2016   Ulcerative colitis by biopsy ([place])  . EXTERNAL EAR SURGERY    . KELOID EXCISION  10/2007   left ear (Dr. Towanda Malkin)        Home Medications    Prior to Admission medications   Medication Sig Start Date End Date Taking? Authorizing Provider  DELZICOL 400 MG CPDR DR capsule TAKE 4  CAPSULES BY MOUTH 3 TIMES A DAY 08/02/17   Nelida Meuse III, MD  Diclofenac Sodium (PENNSAID) 2 % SOLN Place 1 application onto the skin 2 (two) times daily. 10/04/18   Gerda Diss, DO  ferrous sulfate 325 (65 FE) MG tablet Take 1 tablet (325 mg total) by mouth 2 (two) times daily with a meal. 04/21/18   [name], MD    Family History Family History  Problem Relation Age of Onset  . Drug abuse Father   . CAD Other        MI; angina  . Cancer Other        colon  . Diabetes Maternal Uncle   . Cancer Maternal Grandmother        lung cancer met. to brain  . Depression Maternal Grandmother   . Stroke Maternal Grandmother   . Colon cancer Maternal Grandmother     Social History Social History   Tobacco Use  . Smoking status: Never Smoker  . Smokeless tobacco: Never Used  Substance Use Topics  . Alcohol use: No    Alcohol/week: 0.0 standard drinks  . Drug use: No     Allergies   Patient has no known allergies.   Review of Systems Review of Systems  Constitutional: Positive for fatigue.  HENT: Negative.   Eyes: Negative.   Respiratory: Negative.   Cardiovascular: Negative.   Gastrointestinal: Negative.   Endocrine: Negative.  Genitourinary: Negative.   Musculoskeletal: Negative.   Allergic/Immunologic: Negative.   Neurological: Negative.   Hematological: Negative.   Psychiatric/Behavioral: Negative.   All other systems reviewed and are negative.    Physical Exam Updated Vital Signs BP 138/76 (BP Location: Right Arm)   Pulse (!) 50   Temp 98.2 F (36.8 C) (Oral)   Resp 18   SpO2 99%   Physical Exam Vitals signs and nursing note reviewed.  HENT:     Head: Normocephalic.     Right Ear: External ear normal.     Left Ear: External ear normal.     Nose: Nose normal.     Mouth/Throat:     Mouth: Mucous membranes are moist.  Eyes:     Conjunctiva/sclera: Conjunctivae normal.     Pupils: Pupils are equal, round, and reactive to light.  Neck:      Musculoskeletal: Normal range of motion.  Cardiovascular:     Rate and Rhythm: Normal rate and regular rhythm.  Pulmonary:     Effort: Pulmonary effort is normal.  Abdominal:     General: Abdomen is flat. Bowel sounds are normal.  Skin:    General: Skin is warm.  Neurological:     Mental Status: He is alert.      ED Treatments / Results  Labs (all labs ordered are listed, but only abnormal results are displayed) Labs Reviewed  COMPREHENSIVE METABOLIC PANEL  CBC  POC OCCULT BLOOD, ED  TYPE AND SCREEN    EKG None  Radiology No results found.  Procedures Procedures (including critical care time)  Medications Ordered in ED Medications - No data to display   Initial Impression / Assessment and Plan / ED Course  I have reviewed the triage vital signs and the nursing notes.  Pertinent labs & imaging results that were available during my care of the patient were reviewed by me and considered in my medical decision making (see chart for details).   28 year old male with ulcerative colitis on Delzicol who presents today with increased rectal bleeding.  Hemoglobin here is stable.  Discussed with trauma, on-call for [facility] gastroenterology where patient is followed Discussed with Dr. Paulita Fujita and will place on 40 mg prednisone daily for the next 14 days.  Have impressed with the patient that he needs to be seen for his appointment he can be on taper.  Discussed return precautions specifically increased bleeding, lightheadedness, or pain Creatinine stable but elevated at 1.48  Final Clinical Impressions(s) / ED Diagnoses   Final diagnoses:  Rectal bleeding  Ulcerative colitis with rectal bleeding, unspecified location [facility])    ED Discharge Orders    None       Pattricia Boss, MD 12/02/18 2308

## 2018-12-02 NOTE — ED Notes (Addendum)
HEMOCCULT CARD AT BEDSIDE.  

## 2019-01-02 ENCOUNTER — Encounter: Payer: Self-pay | Admitting: *Deleted

## 2019-01-02 ENCOUNTER — Ambulatory Visit (INDEPENDENT_AMBULATORY_CARE_PROVIDER_SITE_OTHER): Payer: BLUE CROSS/BLUE SHIELD | Admitting: Physician Assistant

## 2019-01-02 ENCOUNTER — Encounter: Payer: Self-pay | Admitting: Physician Assistant

## 2019-01-02 ENCOUNTER — Ambulatory Visit (INDEPENDENT_AMBULATORY_CARE_PROVIDER_SITE_OTHER): Payer: BLUE CROSS/BLUE SHIELD

## 2019-01-02 VITALS — BP 120/80 | HR 54 | Temp 98.5°F | Ht 70.0 in | Wt 202.0 lb

## 2019-01-02 DIAGNOSIS — M25552 Pain in left hip: Secondary | ICD-10-CM

## 2019-01-02 NOTE — Patient Instructions (Signed)
It was great to see you!  I will be in touch with your official xray results once read by the radiologist.  Physical therapy will be contacting you soon for appointment.  Take care,  Jarold Motto PA-C
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
## 2019-01-03 ENCOUNTER — Telehealth: Payer: Self-pay | Admitting: Physician Assistant

## 2019-01-03 NOTE — Telephone Encounter (Signed)
Copied from CRM [id number]. Topic: Referral - Question >> Jan 03, 2019  9:21 AM Gaynelle Adu wrote: Reason for CRM: patient is calling to request a update on his referral for PT, He stated  wake up in discomfort and wanting to have a call back from a nurse in regards to a medication to help with the discomfort

## 2019-01-03 NOTE — Telephone Encounter (Signed)
Spoke to pt told him due to being on the prednisone the only thing he can take for pain is Tylenol per Lelon Mast.  Pt verbalized understanding. Pt asked if the prednisone will do anything? Told him it will help with inflammation. Pt verbalized understanding.

## 2019-01-05 NOTE — Telephone Encounter (Signed)
Contact patient to schedule referral

## 2019-01-05 NOTE — Telephone Encounter (Signed)
Patient's calling back to check the status of the referral for physical therapy. Please call back @ [phone]

## 2019-01-06 ENCOUNTER — Ambulatory Visit (INDEPENDENT_AMBULATORY_CARE_PROVIDER_SITE_OTHER): Payer: BLUE CROSS/BLUE SHIELD | Admitting: Sports Medicine

## 2019-01-06 ENCOUNTER — Encounter: Payer: Self-pay | Admitting: Sports Medicine

## 2019-01-06 VITALS — BP 140/84 | HR 64 | Ht 70.0 in | Wt 198.0 lb

## 2019-01-06 DIAGNOSIS — M9905 Segmental and somatic dysfunction of pelvic region: Secondary | ICD-10-CM

## 2019-01-06 DIAGNOSIS — M545 Low back pain, unspecified: Secondary | ICD-10-CM

## 2019-01-06 DIAGNOSIS — M25552 Pain in left hip: Secondary | ICD-10-CM | POA: Diagnosis not present

## 2019-01-06 DIAGNOSIS — M9908 Segmental and somatic dysfunction of rib cage: Secondary | ICD-10-CM

## 2019-01-06 DIAGNOSIS — M9902 Segmental and somatic dysfunction of thoracic region: Secondary | ICD-10-CM

## 2019-01-06 DIAGNOSIS — M9903 Segmental and somatic dysfunction of lumbar region: Secondary | ICD-10-CM | POA: Diagnosis not present

## 2019-01-06 DIAGNOSIS — M9904 Segmental and somatic dysfunction of sacral region: Secondary | ICD-10-CM
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
## 2019-01-06 NOTE — Patient Instructions (Addendum)
Also check out [employer]" which is a program developed by Dr. Minerva Ends.   There are links to a couple of his YouTube Videos below and I would like to see you performing one of his videos 5-6 days per week.  It is best to do these exercises first thing in the morning.  They will give you a good jumpstart here today and start normalizing the way you move.  A good intro video is: "Independence from Pain 7-minute Video" - [email or web address].com   A more advanced video is: Interior and spatial designer original 12 minutes" - [email or web address].king-[email or web address]/  Exercises that focus more on the neck are as below: Dr. Archie Balboa with [place] teaching neck and shoulder details Part 1 - https://youtu.be/[email or web address] Part 2 Dr. Archie Balboa with [facility] quick routine to practice daily - https://youtu.be/[email or web address]  Do not try to attempt the entire video when first beginning.  Try breaking of each exercise that he goes into shorter segments.  In other words, if they perform an exercise for 45 seconds, start with 15 seconds and rest and then resume when they begin the new activity.  If you work your way up to being able to do these videos without having to stop, I expect you will see significant improvements in your pain.  If you enjoy his videos and would like to find out more you can look on his website: [email or web address]  He has a workout streaming option as well as a DVD set available for purchase.  Amazon has the best price for his DVDs.

## 2019-01-10 ENCOUNTER — Ambulatory Visit (INDEPENDENT_AMBULATORY_CARE_PROVIDER_SITE_OTHER): Payer: BLUE CROSS/BLUE SHIELD | Admitting: Physical Therapy

## 2019-01-10 ENCOUNTER — Encounter: Payer: Self-pay | Admitting: Physical Therapy

## 2019-01-10 DIAGNOSIS — M545 Low back pain, unspecified: Secondary | ICD-10-CM

## 2019-01-10 DIAGNOSIS — M25552 Pain in left hip: Secondary | ICD-10-CM | POA: Diagnosis not present

## 2019-01-10 NOTE — Patient Instructions (Signed)
Access Code: [email or web address]  URL: [email or web address]  Date: 01/10/2019  Prepared by: Sedalia Muta   Exercises  Hip Flexion Stretch - 3 reps - 30 hold - 2x daily  Childs Pose - 3 reps - 30 hold - 2x daily  Seated Hamstring Stretch - 3 reps - 30 hold - 2x daily  Seated Piriformis Stretch with Trunk Bend - 3 reps - 30 hold - 2x daily  Supine Piriformis Stretch - 3 reps - 30 hold - 2x daily
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
## 2019-01-12 ENCOUNTER — Encounter: Payer: Self-pay | Admitting: Sports Medicine

## 2019-01-12 ENCOUNTER — Ambulatory Visit (INDEPENDENT_AMBULATORY_CARE_PROVIDER_SITE_OTHER): Payer: BLUE CROSS/BLUE SHIELD | Admitting: Sports Medicine

## 2019-01-12 VITALS — BP 110/78 | HR 57 | Ht 70.0 in | Wt 195.6 lb

## 2019-01-12 DIAGNOSIS — M9902 Segmental and somatic dysfunction of thoracic region: Secondary | ICD-10-CM

## 2019-01-12 DIAGNOSIS — M9903 Segmental and somatic dysfunction of lumbar region: Secondary | ICD-10-CM

## 2019-01-12 DIAGNOSIS — M9908 Segmental and somatic dysfunction of rib cage: Secondary | ICD-10-CM

## 2019-01-12 DIAGNOSIS — M545 Low back pain, unspecified: Secondary | ICD-10-CM

## 2019-01-12 DIAGNOSIS — M9905 Segmental and somatic dysfunction of pelvic region: Secondary | ICD-10-CM

## 2019-01-12 DIAGNOSIS — M25552 Pain in left hip: Secondary | ICD-10-CM | POA: Diagnosis not present

## 2019-01-12 DIAGNOSIS — M9904 Segmental and somatic dysfunction of sacral region: Secondary | ICD-10-CM

## 2019-01-14 ENCOUNTER — Encounter: Payer: Self-pay | Admitting: Sports Medicine

## 2019-01-14 NOTE — Progress Notes (Signed)
Kenneth Terrell. Kenneth Terrell, Kenneth Terrell at [facility] - 28 y.o. male MRN [id number]  Date of birth: [birth year]  Visit Date: 01/12/2019  PCP: [name], MD   Referred by: [name], MD  SUBJECTIVE:   Chief Complaint  Patient presents with  . Follow-up    L hip/ thigh pain.  Archie Balboa.    HPI: Patient is here for follow-up of his left hip and posterior thigh pain.  He is continued to have some hamstring tightness especially personally in the morning and gets pain that radiates into the left upper calf.  Is worse with weightbearing.  Prolonged walking of 45 to 60 minutes seems to exacerbate it.  He has been going to physical therapy as well as performing the home therapeutic exercises.  He is on low-dose steroid for his Crohn's.  He is responding well and improving however is concerned about climbing a tree given the pain in his leg.  REVIEW OF SYSTEMS: No significant nighttime awakenings due to this issue. Denies fevers, chills, recent weight gain or weight loss.  No night sweats.  Pt denies any change in bowel or bladder habits, muscle weakness, numbness or falls associated with this pain.  HISTORY:  Prior history reviewed and updated per electronic medical record.  Patient Active Problem List   Diagnosis Date Noted  . Wrist pain 10/04/2018  . Anemia due to blood loss 02/19/2017  . 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 Ulcerative colitis ([place]) 03/07/2016    By colonoscopy 2017 Did not f/u with [facility] or [facility]. Now followed by [facility] Dr Alessandra Bevels Has tried colazal, asacol, canasa and uceris. Now on Delzicol   . Genital warts 12/26/2013  . Healthcare maintenance 01/16/2013  . Molluscum contagiosum 01/16/2013  . Gynecomastia 10/19/2012  . Left hip pain 07/18/2012    01/02/2019 XR L hip FINDINGS: There is no evidence of hip fracture or dislocation.  There is no evidence of arthropathy or other focal bone abnormality.   Marland Kitchen BIPOLAR DISORDER UNSPECIFIED 01/11/2008    Qualifier: Diagnosis of  By: Zara Council LPN, Mariann Laster     . Attention deficit hyperactivity disorder (ADHD) 01/11/2008    Annotation: 4 YOA Qualifier: Diagnosis of  By: Zara Council LPN, Mariann Laster      Social History   Occupational History  . Occupation: Lexicographer: UNEMPLOYED    Comment: Going to [employer]  Tobacco Use  . Smoking status: Never Smoker  . Smokeless tobacco: Never Used  Substance and Sexual Activity  . Alcohol use: No    Alcohol/week: 0.0 standard drinks  . Drug use: No  . Sexual activity: Yes    Partners: Female   Social History   Social History Narrative   Lives with mother   Dad is in [place]//does not see on a regular basis   Hobbies; BB, FB   Graduated from [employer], [employer], wants to be certified Physiological scientist - to go to school in [place]   Activity: gym 6d/wk, T25   Diet: lots of sweets, good water, fruits/vegetables seldom    OBJECTIVE:  VS:  HT:5' 10"  (177.8 cm)   WT:195 lb 9.6 oz (88.7 kg)  BMI:28.07    BP:110/78  HR:(!) 57bpm  TEMP: ( )  RESP:97 %   PHYSICAL EXAM: Adult male. No acute distress.  Alert and appropriate. Good cervical and lumbar range of motion although there are functional  limitations. Negative Spurling's compression test and Lhermitte's compression test.   Upper extremity lower extremity strength is 5/5 in all myotomes. Normal sensation Significant anterior chain dominant posture.    ASSESSMENT:  No diagnosis found.  PROCEDURES:  PROCEDURE NOTE : OSTEOPATHIC MANIPULATION The decision today to treat with Osteopathic Manipulative Therapy (OMT) was based on physical exam findings. Verbal consent was obtained following a discussion with the patient regarding the of risks, benefits and potential side effects, including an acute pain flare,post manipulation soreness and need for repeat treatments.      Contraindications to OMT: NONE  Manipulation was performed as below: [facility] Treated & Osteopathic Exam Findings  THORACIC SPINE:  T6 - 8 Neutral, rotated LEFT, sidebent RIGHT RIBS:  Rib 9 Right  Posterior LUMBAR SPINE:  L4 FRS LEFT (Flexed, Rotated & Sidebent) PELVIS:  Left psoas spasm Left anterior innonimate SACRUM:  R on R sacral torsion   OMT Techniques Used  HVLA muscle energy myofascial release HVLA - Long Lever    The patient tolerated the treatment well and reported Improved symptoms following treatment today. Patient was given medications, exercises, stretches and lifestyle modifications per AVS and verbally.     PLAN:  Pertinent additional documentation may be included in corresponding procedure notes, imaging studies, problem based documentation and patient instructions.  No problem-specific Assessment & Plan notes found for this encounter.   He is showing good improvement however I agree is not quite ready to go back to work given the significant tightness on his hamstring.  He had good improvement today with his left hemipelvis and I suspect over the next week he will be continuing to improve.  Should continue with physical therapy  Continue previously prescribed home exercise program.  Continue working with physical therapy  Discussed the underlying features of tight hip flexors leading to crouched, fetal like position that results in spinal column compression.  Including lumbar hyperflexion with hypermobility, thoracic flexion with restrictive rotation and cervical lordosis reversal.   Osteopathic manipulation was performed today based on physical exam findings.  Patient has responded well to osteopathic manipulation previously the prior manipulation did not provide permanent long lasting relief.  The patient does feel as though there was significant benefit to the prior manipulation and they wished for repeat manipulation today.  They understand that home  therapeutic exercises are critical part of the healing/treatment process and will continue with self treatment between now and their next visit as outlined.  The patient understands that the frequency of visits is meant to provide a stimulus to promote the body's own ability to heal and is not meant to be the sole means for improvement in their symptoms.  Activity modifications and the importance of avoiding exacerbating activities (limiting pain to no more than a 4 / 10 during or following activity) recommended and discussed.  Discussed red flag symptoms that warrant earlier emergent evaluation and patient voices understanding.   No orders of the defined types were placed in this encounter.  Lab Orders  No laboratory test(s) ordered today   Imaging Orders  No imaging studies ordered today   Referral Orders  No referral(s) requested today    At follow up will plan to consider: repeat osteopathic manipulation  Return in about 1 week (around 01/19/2019).          Gerda Diss, [place] [facility] Physician

## 2019-01-18 ENCOUNTER — Encounter: Payer: Self-pay | Admitting: Physical Therapy

## 2019-01-18 ENCOUNTER — Ambulatory Visit (INDEPENDENT_AMBULATORY_CARE_PROVIDER_SITE_OTHER): Payer: BLUE CROSS/BLUE SHIELD | Admitting: Physical Therapy

## 2019-01-18 DIAGNOSIS — M545 Low back pain, unspecified: Secondary | ICD-10-CM

## 2019-01-18 DIAGNOSIS — M25552 Pain in left hip: Secondary | ICD-10-CM

## 2019-01-18 NOTE — Therapy (Signed)
[facility] [address] [place], [place], [address] Phone: [phone]   Fax:  [phone]  Physical Therapy Treatment  Patient Details  Name: Kenneth Terrell MRN: [id number] Date of Birth: Feb 05, 1991 Referring Provider (PT): Jarold Motto   Encounter Date: 01/18/2019  PT End of Session - 01/18/19 1623    Visit Number  2    Number of Visits  12    Date for PT Re-Evaluation  02/21/19    Authorization Type  BCBS    PT Start Time  1020    PT Stop Time  1101    PT Time Calculation (min)  41 min    Activity Tolerance  Patient tolerated treatment well    Behavior During Therapy  [facility] for tasks assessed/performed       Past Medical History:  Diagnosis Date  . ADHD 01/11/2008   3 YOA   . BIPOLAR DISORDER UNSPECIFIED 01/11/2008   actually thought more depression - ?misdiagnosis  . History of chlamydia 2008   treated  . Ulcerative colitis ([facility]) 03/2016   by colonoscopy    Past Surgical History:  Procedure Laterality Date  . [facility]mission  1999   x 1 week  . COLONOSCOPY  03/2016   Ulcerative colitis by biopsy ([place])  . EXTERNAL EAR SURGERY    . KELOID EXCISION  10/2007   left ear (Dr. Shon Hough)    There were no vitals filed for this visit.  Subjective Assessment - 01/18/19 1622    Subjective  Pt states much decreased pain since last visit. He states back is very minimally painful, and L hamstring pain is mostly resolved. He has mild pain in L foot.     Currently in Pain?  Yes    Pain Score  2     Pain Location  Hip    Pain Orientation  Right    Pain Descriptors / Indicators  Aching    Pain Type  Acute pain    Pain Onset  More than a month ago    Pain Frequency  Intermittent                       [facility] Adult PT Treatment/Exercise - 01/18/19 1017      Exercises   Exercises  Lumbar      Lumbar Exercises: Stretches   Active Hamstring Stretch  2 reps;30 seconds    Active Hamstring Stretch  Limitations  seated    Passive Hamstring Stretch  2 reps;30 seconds    Passive Hamstring Stretch Limitations  wiht ankle pump for nerve glide with strap;     Single Knee to Chest Stretch  --    Piriformis Stretch  2 reps;30 seconds    Piriformis Stretch Limitations  supine, fig 4    Figure 4 Stretch  --    Figure 4 Stretch Limitations  --    Other Lumbar Stretch Exercise  Childs pose L/R/Center, 30 sec x1 each;     Other Lumbar Stretch Exercise  Gastroc and soleus stretch at wall 30 sec x2 each (for HEP )  Great toe self stretch 30 sec x3 on L (for HEP)       Lumbar Exercises: Aerobic   Stationary Bike  L2 x 8 min      Lumbar Exercises: Standing   Other Standing Lumbar Exercises  Hip abd RTB x20 bil;       Lumbar Exercises: Supine   Other Supine Lumbar Exercises  90/90 heel taps x20; Bicycle 2x15;     Other Supine Lumbar Exercises  modified crunch x20;       Lumbar Exercises: Sidelying   Hip Abduction  20 reps;Both               PT Short Term Goals - 01/10/19 1204      PT SHORT TERM GOAL #1   Title  Pt to report decreased pain in low back/ L SI, to 2/10 with activity.     Time  2    Period  Weeks    Status  New    Target Date  01/24/19      PT SHORT TERM GOAL #2   Title  Pt to be independent with initial HEP     Time  2    Period  Weeks    Status  New    Target Date  01/24/19        PT Long Term Goals - 01/10/19 1205      PT LONG TERM GOAL #1   Title  Pt to be independent with final HEP     Time  6    Period  Weeks    Status  New    Target Date  02/21/19      PT LONG TERM GOAL #2   Title  Pt to demo decreased pain in back/SI to 0-2/10 with activity     Time  6    Period  Weeks    Status  New    Target Date  02/21/19      PT LONG TERM GOAL #3   Title  Pt to demo proper squat, lift mechanics, to improve work duties and reduce risk for injury.     Time  6    Period  Weeks    Status  New    Target Date  02/21/19      PT LONG TERM GOAL #4    Title  Pt to demo ability for amublation up to 1 mi without increased pain in L back/SI, to improve ability for work duties.     Time  6    Period  Weeks    Status  New    Target Date  02/21/19            Plan - 01/18/19 1624    Clinical Impression Statement  Pt with decreased pain with palpation of low back, SI, and hamstring today. Pt with ability to progress ther ex, with no pain. He does have noted weakness in L hip vs R,. He does have mild pain in L arch of foot, also improved. Given stretches and HEP for this today as well. Discussed focus on core strength, vs back strenth, pt with hypertoicity of paraspinals already. Pt progressing well. Pt to be seen for 1-2 more visits as needed.     Rehab Potential  Good    PT Frequency  2x / week    PT Duration  6 weeks    PT Treatment/Interventions  ADLs/Self Care Home Management;Cryotherapy;Iontophoresis 4mg /ml Dexamethasone;Electrical Stimulation;Moist Heat;Traction;Ultrasound;Gait training;Stair training;Functional mobility training;Orthotic Fit/Training;Patient/family education;Balance training;Neuromuscular re-education;Therapeutic exercise;Therapeutic activities;Manual techniques;Passive range of motion;Spinal Manipulations;Taping;Dry needling;Joint Manipulations;Other (comment)    Consulted and Agree with Plan of Care  Patient       Patient will benefit from skilled therapeutic intervention in order to improve the following deficits and impairments:  Decreased range of motion, Difficulty walking, Increased muscle spasms, Decreased endurance, Decreased activity tolerance, Pain, Impaired flexibility, Decreased  mobility, Decreased strength  Visit Diagnosis: Left hip pain  Acute left-sided low back pain without sciatica     Problem List Patient Active Problem List   Diagnosis Date Noted  . Wrist pain 10/04/2018  . Anemia due to blood loss 02/19/2017  . Chronic idiopathic gout involving toe of left foot without tophus 01/21/2017   . Ulcerative colitis ([facility]) 03/07/2016  . Genital warts 12/26/2013  . Healthcare maintenance 01/16/2013  . Molluscum contagiosum 01/16/2013  . Gynecomastia 10/19/2012  . Left hip pain 07/18/2012  . BIPOLAR DISORDER UNSPECIFIED 01/11/2008  . Attention deficit hyperactivity disorder (ADHD) 01/11/2008    Sedalia MutaLauren Maksim Peregoy, PT, DPT 4:29 PM  01/18/19    [facility] [address] [place], [place], [address] Phone: [phone]   Fax:  [phone]  Name: Kenneth Terrell MRN: [id number] Date of Birth: 11/16/1991

## 2019-01-19 ENCOUNTER — Ambulatory Visit (INDEPENDENT_AMBULATORY_CARE_PROVIDER_SITE_OTHER): Payer: BLUE CROSS/BLUE SHIELD | Admitting: Sports Medicine

## 2019-01-19 ENCOUNTER — Encounter: Payer: Self-pay | Admitting: Sports Medicine

## 2019-01-19 VITALS — BP 110/80 | HR 49 | Ht 70.0 in | Wt 197.4 lb

## 2019-01-19 DIAGNOSIS — M9902 Segmental and somatic dysfunction of thoracic region: Secondary | ICD-10-CM | POA: Diagnosis not present

## 2019-01-19 DIAGNOSIS — M25552 Pain in left hip: Secondary | ICD-10-CM

## 2019-01-19 DIAGNOSIS — M9908 Segmental and somatic dysfunction of rib cage: Secondary | ICD-10-CM

## 2019-01-19 DIAGNOSIS — M9903 Segmental and somatic dysfunction of lumbar region: Secondary | ICD-10-CM | POA: Diagnosis not present

## 2019-01-19 DIAGNOSIS — M545 Low back pain, unspecified: Secondary | ICD-10-CM

## 2019-01-19 DIAGNOSIS — M9904 Segmental and somatic dysfunction of sacral region: Secondary | ICD-10-CM

## 2019-01-19 NOTE — Progress Notes (Signed)
Kenneth Terrell. , [place] at [facility] - 28 y.o. male MRN [id number]  Date of birth: 05/04/1991  Visit Date: January 20, 2019  PCP: [name], MD   Referred by: [name], MD  SUBJECTIVE:  Chief Complaint  Patient presents with  . Left Hip - Follow-up    L hip/thigh pain. Worse w/ prolonged walking. Going to PT. XR L hip 01/02/19. XR L-spine 09/27/15. Out of work through 01/18/19.     HPI: Kenneth Terrell is here for follow-up of his left leg and thigh pain.  He overall is improving.  His symptoms are mild.  He occasionally has pain that still radiates into the left foot and thigh pain but this is minimal.  Tends to happen after a prolonged low-level activity such as walking for 45 to 60 minutes.  He is taking prednisone 20 mg currently for his concomitant medical comorbidity.  He is performing home therapeutic exercises including stretching and given videos.  REVIEW OF SYSTEMS: No significant nighttime awakenings due to this issue. Denies fevers, chills, recent weight gain or weight loss.  No night sweats.  Pt denies any change in bowel or bladder habits, muscle weakness, numbness or falls associated with this pain.  HISTORY:  Prior history reviewed and updated per electronic medical record.  Patient Active Problem List   Diagnosis Date Noted  . Wrist pain 10/04/2018  . Anemia due to blood loss 02/19/2017  . 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 Ulcerative colitis ([place]) 03/07/2016    By colonoscopy 2017 Did not f/u with [facility] or [facility]. Now followed by [facility] Dr Alessandra Bevels Has tried colazal, asacol, canasa and uceris. Now on Delzicol   . Genital warts 12/26/2013  . Healthcare maintenance 01/16/2013  . Molluscum contagiosum 01/16/2013  . Gynecomastia 10/19/2012  . Left hip pain 07/18/2012    01/02/2019 XR L hip FINDINGS: There is no evidence of  hip fracture or dislocation. There is no evidence of arthropathy or other focal bone abnormality.   Marland Kitchen BIPOLAR DISORDER UNSPECIFIED 01/11/2008    Qualifier: Diagnosis of  By: Zara Council LPN, Mariann Laster     . Attention deficit hyperactivity disorder (ADHD) 01/11/2008    Annotation: 3 YOA Qualifier: Diagnosis of  By: Zara Council LPN, Mariann Laster      Social History   Occupational History  . Occupation: Lexicographer: UNEMPLOYED    Comment: Going to [employer]  Tobacco Use  . Smoking status: Never Smoker  . Smokeless tobacco: Never Used  Substance and Sexual Activity  . Alcohol use: No    Alcohol/week: 0.0 standard drinks  . Drug use: No  . 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 No acute distress.  Alert and appropriate. His left leg is overall well  aligned.  His hip tightness is significantly improved and he has good range of motion.  Negative straight leg raise today.  Continues to have functional findings per osteopathic exam.   ASSESSMENT:   1. Left hip pain   2. Acute left-sided low back pain without sciatica   3. Somatic dysfunction of thoracic region   4. Somatic dysfunction of lumbar region   5. Somatic dysfunction of rib cage region   6. Somatic dysfunction of sacral region     PROCEDURES:  PROCEDURE NOTE: OSTEOPATHIC MANIPULATION  The decision today to treat with Osteopathic Manipulative Therapy (OMT) was based on physical exam findings. Verbal consent was obtained following a discussion  with the patient regarding the of risks, benefits and potential side effects, including an acute pain flare,post manipulation soreness and need for repeat treatments.   Contraindications to OMT: NONE Manipulation was performed as below: [facility] Treated & Osteopathic Exam Findings THORACIC SPINE:  T6 - 9 Neutral, rotated LEFT, sidebent RIGHT RIBS:  Rib 7 Right  Posterior LUMBAR SPINE:  L3 FRS LEFT (Flexed, Rotated & Sidebent) SACRUM:  R on R sacral torsion  OMT Techniques Used: HVLA muscle energy myofascial release  The patient tolerated the treatment well and reported Improved symptoms following treatment today. Patient was given medications, exercises, stretches and lifestyle modifications per AVS and verbally.    PLAN:  Pertinent additional documentation may be included in corresponding procedure notes, imaging studies, problem based documentation and patient instructions.  No problem-specific Assessment & Plan notes found for this encounter.   He is doing better.  His symptoms are almost completely resolved and back to baseline.  We discussed the importance of ongoing therapeutic exercises as well as strengthening.  We will have him return to work and ensure that he is done well with this when he follows up.  Continue previously prescribed home exercise program.   Osteopathic manipulation was performed today based on physical exam findings.  Patient has responded well to osteopathic manipulation previously the prior manipulation did not provide permanent long lasting relief.  The patient does feel as though there was significant benefit to the prior manipulation and they wished for repeat manipulation today.  They understand that home therapeutic exercises are critical part of the healing/treatment process and will continue with self treatment between now and their next visit as outlined.  The patient understands that the frequency of visits is meant to provide a stimulus to promote  the body's own ability to heal and is not meant to be the sole means for improvement in their symptoms.  Activity modifications and the importance of avoiding exacerbating activities (limiting pain to no more than a 4 / 10 during or following activity) recommended and discussed.  Discussed red flag symptoms that warrant earlier emergent evaluation and patient voices understanding.    No orders of the defined types were placed in this encounter.  Lab Orders  No laboratory test(s) ordered today   Imaging Orders  No imaging studies ordered today   Referral Orders  No referral(s) requested today    Return in about 1 week (around 01/26/2019) for consideration of repeat Osteopathic Manipulation.          Gerda Diss, [place] [facility] Physician

## 2019-01-23 ENCOUNTER — Ambulatory Visit (INDEPENDENT_AMBULATORY_CARE_PROVIDER_SITE_OTHER): Payer: BLUE CROSS/BLUE SHIELD | Admitting: Physical Therapy

## 2019-01-23 ENCOUNTER — Encounter: Payer: Self-pay | Admitting: Physical Therapy

## 2019-01-23 DIAGNOSIS — M25552 Pain in left hip: Secondary | ICD-10-CM | POA: Diagnosis not present

## 2019-01-23 DIAGNOSIS — M545 Low back pain, unspecified: Secondary | ICD-10-CM

## 2019-01-23 NOTE — Therapy (Addendum)
[facility] [address] [place], [place], [address] Phone: [phone]   Fax:  [phone]  Physical Therapy Treatment  Patient Details  Name: Kenneth Terrell MRN: [id number] Date of Birth: [birth year] Referring Provider (PT): Inda Coke   Encounter Date: 01/23/2019  PT End of Session - 01/23/19 1220    Visit Number  3    Number of Visits  12    Date for PT Re-Evaluation  02/21/19    Authorization Type  BCBS    PT Start Time  1217    PT Stop Time  1300    PT Time Calculation (min)  43 min    Activity Tolerance  Patient tolerated treatment well    Behavior During Therapy  [place] for tasks assessed/performed       Past Medical History:  Diagnosis Date  . ADHD 01/11/2008   4 YOA   . BIPOLAR DISORDER UNSPECIFIED 01/11/2008   actually thought more depression - ?misdiagnosis  . History of chlamydia 2008   treated  . Ulcerative colitis ([place]) 03/2016   by colonoscopy    Past Surgical History:  Procedure Laterality Date  . [facility] Admission  1999   x 1 week  . COLONOSCOPY  03/2016   Ulcerative colitis by biopsy ([place])  . EXTERNAL EAR SURGERY    . KELOID EXCISION  10/2007   left ear (Dr. Towanda Malkin)    There were no vitals filed for this visit.  Subjective Assessment - 01/23/19 1219    Subjective  Pt states decreased pain, no pain in back, foot, or leg. He did return to work last week, and was able to do most regular activities without problems.     Currently in Pain?  No/denies    Pain Score  0-No pain                       [facility] Adult PT Treatment/Exercise - 01/23/19 1227      Exercises   Exercises  Lumbar      Lumbar Exercises: Stretches   Active Hamstring Stretch  2 reps;30 seconds    Active Hamstring Stretch Limitations  seated    Passive Hamstring Stretch  --    Passive Hamstring Stretch Limitations  --    Piriformis Stretch  2 reps;30 seconds    Piriformis Stretch Limitations  supine, fig 4     Other Lumbar Stretch Exercise  Childs pose L/R/Center, 30 sec x1 each;     Other Lumbar Stretch Exercise  Kneeling Hip flexor stretch 30 sec x2 bil;       Lumbar Exercises: Aerobic   Stationary Bike  L2 x 10 min      Lumbar Exercises: Standing   Other Standing Lumbar Exercises  Hip abd RTB x20 bil;     Other Standing Lumbar Exercises  Squat/walk with GTB at thighs 20 ft x4;       Lumbar Exercises: Supine   Bent Knee Raise  20 reps    Bent Knee Raise Limitations  with PBall    Other Supine Lumbar Exercises  90/90 heel taps x20; Bicycle 2x15;     Other Supine Lumbar Exercises  High plank 30 secx2 ; Low plank/elbows 30 sec x2;       Lumbar Exercises: Sidelying   Hip Abduction  --             PT Education - 01/23/19 1220    Education Details  Reviewed exercise progression and HEP  Person(s) Educated  Patient    Methods  Explanation    Comprehension  Verbalized understanding       PT Short Term Goals - 01/23/19 1350      PT SHORT TERM GOAL #1   Title  Pt to report decreased pain in low back/ L SI, to 2/10 with activity.     Time  2    Period  Weeks    Status  Achieved    Target Date  01/24/19      PT SHORT TERM GOAL #2   Title  Pt to be independent with initial HEP     Time  2    Period  Weeks    Status  Achieved    Target Date  01/24/19        PT Long Term Goals - 01/23/19 1350      PT LONG TERM GOAL #1   Title  Pt to be independent with final HEP     Time  6    Period  Weeks    Status  Achieved      PT LONG TERM GOAL #2   Title  Pt to demo decreased pain in back/SI to 0-2/10 with activity     Time  6    Period  Weeks    Status  Achieved      PT LONG TERM GOAL #3   Title  Pt to demo proper squat, lift mechanics, to improve work duties and reduce risk for injury.     Time  6    Period  Weeks    Status  Achieved      PT LONG TERM GOAL #4   Title  Pt to demo ability for amublation up to 1 mi without increased pain in L back/SI, to improve ability  for work duties.     Time  6    Period  Weeks    Status  Achieved            Plan - 01/23/19 1350    Clinical Impression Statement  Pt with no pain in back, hip or foot today. He has been able to increase physical activity level, without increased pain. Pt able to progress ther ex and core today. Discussed safety for back with core exercises at length. Pt has met goals at this time, ready for d/c to HEP. Pt will be put on hold for 2 weeks, will only return if he has increased pain or difficulty. Pt in agreement wiht plan.     Rehab Potential  Good    PT Frequency  2x / week    PT Duration  6 weeks    PT Treatment/Interventions  ADLs/Self Care Home Management;Cryotherapy;Iontophoresis 76m/ml Dexamethasone;Electrical Stimulation;Moist Heat;Traction;Ultrasound;Gait training;Stair training;Functional mobility training;Orthotic Fit/Training;Patient/family education;Balance training;Neuromuscular re-education;Therapeutic exercise;Therapeutic activities;Manual techniques;Passive range of motion;Spinal Manipulations;Taping;Dry needling;Joint Manipulations;Other (comment)    Consulted and Agree with Plan of Care  Patient       Patient will benefit from skilled therapeutic intervention in order to improve the following deficits and impairments:  Decreased range of motion, Difficulty walking, Increased muscle spasms, Decreased endurance, Decreased activity tolerance, Pain, Impaired flexibility, Decreased mobility, Decreased strength  Visit Diagnosis: Left hip pain  Acute left-sided low back pain without sciatica     Problem List Patient Active Problem List   Diagnosis Date Noted  . Wrist pain 10/04/2018  . Anemia due to blood loss 02/19/2017  . 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  Ulcerative colitis ([facility]) 03/07/2016  . Genital warts 12/26/2013  . Healthcare maintenance 01/16/2013  . Molluscum contagiosum 01/16/2013  . Gynecomastia 10/19/2012   . Left hip pain 07/18/2012  . BIPOLAR DISORDER UNSPECIFIED 01/11/2008  . Attention deficit hyperactivity disorder (ADHD) 01/11/2008    Lyndee Hensen, PT, DPT 1:52 PM  01/23/19    [facility] [place], [place], [address] Phone: [phone]   Fax:  [phone]  Name: BORA BRONER MRN: [id number] Date of Birth: 01/18/1991   PHYSICAL THERAPY DISCHARGE SUMMARY  Visits from Start of Care: 3   Plan: Patient agrees to discharge.  Patient goals were met. Patient is being discharged due to meeting the stated rehab goals.  ?????     Lyndee Hensen, PT, DPT 2:51 PM  05/22/19

## 2019-01-24 ENCOUNTER — Ambulatory Visit (INDEPENDENT_AMBULATORY_CARE_PROVIDER_SITE_OTHER): Payer: BLUE CROSS/BLUE SHIELD | Admitting: Sports Medicine

## 2019-01-24 ENCOUNTER — Encounter: Payer: Self-pay | Admitting: Sports Medicine

## 2019-01-24 VITALS — BP 104/72 | HR 49 | Ht 70.0 in | Wt 199.2 lb

## 2019-01-24 DIAGNOSIS — M9905 Segmental and somatic dysfunction of pelvic region: Secondary | ICD-10-CM | POA: Diagnosis not present

## 2019-01-24 DIAGNOSIS — M25552 Pain in left hip: Secondary | ICD-10-CM | POA: Diagnosis not present

## 2019-01-24 DIAGNOSIS — M9904 Segmental and somatic dysfunction of sacral region: Secondary | ICD-10-CM

## 2019-01-24 DIAGNOSIS — M9902 Segmental and somatic dysfunction of thoracic region: Secondary | ICD-10-CM

## 2019-01-24 DIAGNOSIS — M545 Low back pain, unspecified: Secondary | ICD-10-CM

## 2019-01-24 DIAGNOSIS — M9903 Segmental and somatic dysfunction of lumbar region: Secondary | ICD-10-CM | POA: Diagnosis not present

## 2019-01-24 DIAGNOSIS — M9908 Segmental and somatic dysfunction of rib cage: Secondary | ICD-10-CM

## 2019-01-24 NOTE — Progress Notes (Signed)
Kenneth Terrell. [name] [phone]  Kenneth Terrell - 28 y.o. male MRN [id number]  Date of birth: November 08, 1991  Visit Date: January 24, 2019  PCP: Eustaquio Boyden, MD   Referred by: Eustaquio Boyden, MD  SUBJECTIVE:  Chief Complaint  Patient presents with  . Lower Back - Follow-up    Sx improving overall. Some back pain since stopping prednisone, no radiating pain. Completed PT yesterday.     HPI: Patient with the above symptoms.  He has discontinued the prednisone that he was on ulcerative colitis and this is actually causing exacerbation of his back pain that is generalized.  He has no radicular symptoms.  He has some stiffness.  He has finished with physical therapy and has returned to work without significant limitations.  REVIEW OF SYSTEMS: No significant nighttime awakenings due to this issue. Denies fevers, chills, recent weight gain or weight loss.  No night sweats.  Pt denies any change in bowel or bladder habits, muscle weakness, numbness or falls associated with this pain.  HISTORY:  Prior history reviewed and updated per electronic medical record.  Patient Active Problem List   Diagnosis Date Noted  . Wrist pain 10/04/2018  . Anemia due to blood loss 02/19/2017  . 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 Ulcerative colitis ([facility]) 03/07/2016    By colonoscopy 2017 Did not f/u with [facility] or [facility]. Now followed by [facility] Dr Levora Angel Has tried colazal, asacol, canasa and uceris. Now on Delzicol   . Genital warts 12/26/2013  . Healthcare maintenance 01/16/2013  . Molluscum contagiosum 01/16/2013  . Gynecomastia 10/19/2012  . Left hip pain 07/18/2012    01/02/2019 XR L hip FINDINGS: There is no evidence of hip fracture or dislocation. There is no evidence of arthropathy or other focal bone abnormality.   Marland Kitchen BIPOLAR DISORDER UNSPECIFIED 01/11/2008   Qualifier: Diagnosis of  By: Lorrene Reid LPN, Burna Mortimer     . Attention deficit hyperactivity disorder (ADHD) 01/11/2008    Annotation: 4 YOA Qualifier: Diagnosis of  By: Lorrene Reid LPN, Burna Mortimer      Social History   Occupational History  . Occupation: Dentist: UNEMPLOYED    Comment: Going to [employer]  Tobacco Use  . Smoking status: Never Smoker  . Smokeless tobacco: Never Used  Substance and Sexual Activity  . Alcohol use: No    Alcohol/week: 0.0 standard drinks  . Drug use: No  . Sexual activity: Yes    Partners: Female   Social History   Social History Narrative   Lives with mother   Dad is in [place]//does not see on a regular basis   Hobbies; BB, FB   Graduated from [employer], wants to be certified Systems analyst - to go to school in [place]   Activity: gym 6d/wk, T25   Diet: lots of sweets, good water, fruits/vegetables seldom    OBJECTIVE:  VS:  HT:5\' 10"  (177.8 cm)   WT:199 lb 3.2 oz (90.4 kg)  BMI:28.58    BP:104/72  HR:(!) 49bpm  TEMP: ( )  RESP:98 %   PHYSICAL EXAM: Adult male. No acute distress.  Alert and appropriate. Good cervical and lumbar range of motion although there are functional limitations. Negative Spurling's compression test and Lhermitte's compression test.   Upper extremity lower extremity strength is 5/5 in all myotomes. Normal sensation Significant anterior chain dominant posture.  ASSESSMENT:   1. Left hip pain   2. Acute left-sided low back pain without sciatica   3. Somatic dysfunction of thoracic region   4. Somatic dysfunction of lumbar region   5. Somatic dysfunction of rib cage region   6. Somatic dysfunction of sacral region   7. Somatic dysfunction of pelvis region     PROCEDURES:  PROCEDURE NOTE: OSTEOPATHIC MANIPULATION   The decision today to treat with Osteopathic Manipulative Therapy (OMT) was based on physical exam findings. Verbal consent was obtained following a discussion with the patient regarding  the of risks, benefits and potential side effects, including an acute pain flare,post manipulation soreness and need for repeat treatments.  NONE  Manipulation was performed as below:  [facility] Treated & Osteopathic Exam Findings   THORACIC SPINE:   T6 - 10 Neutral, rotated LEFT, sidebent RIGHT RIBS:   Rib 9Left  Posterior LUMBAR SPINE:   L3 FRS RIGHT (Flexed, Rotated & Sidebent) PELVIS:    Right psoas spasm Right anterior innonimate SACRUM:   L on L sacral torsion    OMT Techniques Used:  HVLA muscle energy myofascial release    The patient tolerated the treatment well and reported Improved symptoms following treatment today. Patient was given medications, exercises, stretches and lifestyle modifications per AVS and verbally.       PLAN:  Pertinent additional documentation may be included in corresponding procedure notes, imaging studies, problem based documentation and patient instructions.  No problem-specific Assessment & Plan notes found for this encounter.   Overall he is significantly better and has returned to his baseline.  We will have him return to work without limitations.  Continue with home therapeutic exercises and home conditioning program.  Some of the soreness that he is experiencing is coming from discontinuing his steroid and anticipate return to full baseline over the next several weeks.  Continue previously prescribed home exercise program.   Osteopathic manipulation was performed today based on physical exam findings.  Patient has responded well to osteopathic manipulation previously the prior manipulation did not provide permanent long lasting relief.  The patient does feel as though there was significant benefit to the prior manipulation and they wished for repeat manipulation today.  They understand that home therapeutic exercises are critical part of the healing/treatment process and will continue with self treatment between now and their next visit as  outlined.  The patient understands that the frequency of visits is meant to provide a stimulus to promote the body's own ability to heal and is not meant to be the sole means for improvement in their symptoms.  Activity modifications and the importance of avoiding exacerbating activities (limiting pain to no more than a 4 / 10 during or following activity) recommended and discussed.  Discussed red flag symptoms that warrant earlier emergent evaluation and patient voices understanding.   No orders of the defined types were placed in this encounter.  Lab Orders  No laboratory test(s) ordered today   Imaging Orders  No imaging studies ordered today   Referral Orders  No referral(s) requested today    Return if symptoms worsen or fail to improve.          Andrena Mews, DO    [facility]

## 2019-01-25 ENCOUNTER — Encounter: Payer: BLUE CROSS/BLUE SHIELD | Admitting: Physical Therapy

## 2019-01-26 ENCOUNTER — Ambulatory Visit: Payer: BLUE CROSS/BLUE SHIELD | Admitting: Sports Medicine

## 2019-05-05 ENCOUNTER — Telehealth: Payer: BLUE CROSS/BLUE SHIELD | Admitting: Family Medicine

## 2019-05-08 ENCOUNTER — Other Ambulatory Visit: Payer: BLUE CROSS/BLUE SHIELD

## 2019-05-08 ENCOUNTER — Other Ambulatory Visit: Payer: Self-pay | Admitting: Family Medicine

## 2019-05-08 DIAGNOSIS — D500 Iron deficiency anemia secondary to blood loss (chronic): Secondary | ICD-10-CM

## 2019-05-08 DIAGNOSIS — K51311 Ulcerative (chronic) rectosigmoiditis with rectal bleeding: Secondary | ICD-10-CM

## 2019-05-08 DIAGNOSIS — M1A0720 Idiopathic chronic gout, left ankle and foot, without tophus (tophi): Secondary | ICD-10-CM
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
## 2019-05-10 ENCOUNTER — Telehealth: Payer: BLUE CROSS/BLUE SHIELD | Admitting: Family Medicine

## 2019-05-12 ENCOUNTER — Telehealth (INDEPENDENT_AMBULATORY_CARE_PROVIDER_SITE_OTHER): Payer: BLUE CROSS/BLUE SHIELD | Admitting: Family Medicine

## 2019-05-12 ENCOUNTER — Encounter: Payer: Self-pay | Admitting: Family Medicine

## 2019-05-12 ENCOUNTER — Telehealth: Payer: BLUE CROSS/BLUE SHIELD | Admitting: Family Medicine

## 2019-05-12 VITALS — Ht 70.0 in

## 2019-05-12 DIAGNOSIS — Z113 Encounter for screening for infections with a predominantly sexual mode of transmission: Secondary | ICD-10-CM | POA: Diagnosis not present

## 2019-05-12 DIAGNOSIS — Z0000 Encounter for general adult medical examination without abnormal findings: Secondary | ICD-10-CM | POA: Diagnosis not present

## 2019-05-12 DIAGNOSIS — K51311 Ulcerative (chronic) rectosigmoiditis with rectal bleeding: Secondary | ICD-10-CM

## 2019-05-12 NOTE — Assessment & Plan Note (Signed)
Preventative protocols reviewed and updated unless pt declined. Discussed healthy diet and lifestyle.  

## 2019-05-12 NOTE — Progress Notes (Signed)
Virtual visit attempted through MyChart, pt unable to connect. Transitioned visit over to Doxy.Me with success, some trouble with audio. Due to national recommendations of social distancing due to COVID-19, a virtual visit is felt to be most appropriate for this patient at this time. Reviewed limitations of a virtual visit. He agrees to proceed.   Patient location: driving to the beach, passenger in the car with his friends. Sensitive portions of visit (large portion of visit) completed by phone only encounter for patient privacy.  Provider location: Adult nurse at [facility], office If any vitals were documented, they were collected by patient at home unless specified below.   Ht 5\' 10"  (1.778 m)   BMI 28.58 kg/m    CC: CPE Subjective:    Patient ID: Kenneth Terrell, male    DOB: Feb 11, 1991, 28 y.o.   MRN: [id number]  HPI: Kenneth Terrell is a 28 y.o. male presenting on 05/12/2019 for Annual Exam   On the way to [facility] for the weekend. He is going to private house with pool .Reviewed social distancing measures in setting of Covid19 pandemic.  Upcoming lab visit appointnment 05/16/2019.  Requests STD screen.   Ulcerative colitis - has seen [facility]. Did see [facility] temporarily. Then followed by [facility] for last few years but now out of network so he wants to establish with [facility]. Last [facility] note reviewed ([place]) - latest regimen was Delzicol (mesalamine) 400mg  2 cap TID, currently taking 4 cap TID due to active flare.    Preventative: Flu - declines  Tdap 2016  Seat belt use discussed.  Sunscreen use discussed.No suspicious moles on skin.  Ex- smoker quit 2011  Alcohol - none  Rec drugs - none  Eye exam - yearly  Dentist - q6 mo  Currently sexually active, 2 partners in last year.not using protection 100%.  H/o STD - gonorrhea age 39 yotreated.  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     Relevant past medical, surgical, family and social history reviewed and updated as indicated. Interim medical history since our last visit reviewed. Allergies and medications reviewed and updated. Outpatient Medications Prior to Visit  Medication Sig Dispense Refill  . Mesalamine (DELZICOL) 400 MG CPDR DR capsule Take 2 capsules (800 mg total) by mouth 3 (three) times daily.    . DELZICOL 400 MG CPDR DR capsule TAKE 4 CAPSULES BY MOUTH 3 TIMES A DAY (Patient taking differently: 1,600 mg 3 (three) times daily. ) 360 capsule 1   No facility-administered medications prior to visit.      Per HPI unless specifically indicated in ROS section below Review of Systems  Constitutional: Negative for activity change, appetite change, chills, fatigue, fever and unexpected weight change.  HENT: Negative for hearing loss.   Eyes: Negative for visual disturbance.  Respiratory: Negative for cough, chest tightness, shortness of breath and wheezing.   Cardiovascular: Negative for chest pain, palpitations and leg swelling.  Gastrointestinal: Positive for blood in stool and diarrhea. Negative for abdominal distention, abdominal pain, constipation, nausea and vomiting.       UC flare  Genitourinary: Negative for difficulty urinating and hematuria.  Musculoskeletal: Negative for arthralgias, myalgias and neck pain.  Skin: Negative for rash.  Neurological: Negative for dizziness, seizures, syncope and  headaches.  Hematological: Negative for adenopathy. Does not bruise/bleed easily.  Psychiatric/Behavioral: Negative for dysphoric mood and suicidal ideas (no HI). The patient is not nervous/anxious.    Objective:    Ht  (1.778 m)   BMI 28.58 kg/m   Wt Readings from Last 3 Encounters:  01/24/19 199 lb 3.2 oz (90.4  kg)  01/19/19 197 lb 6.4 oz (89.5 kg)  01/12/19 195 lb 9.6 oz (88.7 kg)     Physical exam: Gen: alert, NAD, not ill appearing Pulm: speaks in complete sentences without increased work of breathing Psych: normal mood, normal thought content      Results for orders placed or performed during the hospital encounter of 12/02/18  Comprehensive metabolic panel  Result Value Ref Range   Sodium 138 135 - 145 mmol/L   Potassium 4.0 3.5 - 5.1 mmol/L   Chloride 103 98 - 111 mmol/L   CO2 27 22 - 32 mmol/L   Glucose, Bld 83 70 - 99 mg/dL   BUN 13 6 - 20 mg/dL   Creatinine, Ser 1.61 (H) 0.61 - 1.24 mg/dL   Calcium 9.6 8.9 - 09.6 mg/dL   Total Protein 7.8 6.5 - 8.1 g/dL   Albumin 4.7 3.5 - 5.0 g/dL   AST 33 15 - 41 U/L   ALT 33 0 - 44 U/L   Alkaline Phosphatase 79 38 - 126 U/L   Total Bilirubin 0.5 0.3 - 1.2 mg/dL   GFR calc non Af Amer >60 >60 mL/min   GFR calc Af Amer >60 >60 mL/min   Anion gap 8 5 - 15  CBC  Result Value Ref Range   WBC 6.8 4.0 - 10.5 K/uL   RBC 4.36 4.22 - 5.81 MIL/uL   Hemoglobin 13.7 13.0 - 17.0 g/dL   HCT 04.5 40.9 - 81.1 %   MCV 93.8 80.0 - 100.0 fL   MCH 31.4 26.0 - 34.0 pg   MCHC 33.5 30.0 - 36.0 g/dL   RDW 91.4 78.2 - 95.6 %   Platelets 261 150 - 400 K/uL   nRBC 0.0 0.0 - 0.2 %  Type and screen [facility]  Result Value Ref Range   ABO/RH(D) B POS    Antibody Screen NEG    Sample Expiration      12/05/2018 Performed at [facility], [address] [place], [place] [address]    Assessment & Plan:   Problem List Items Addressed This Visit    Ulcerative colitis [facility])    He is currently taking Delzicol  4 cap TID during active UC flare, then will return to 2 cap TID. Was being followed by Dr Levora Angel at [facility] until they became out of network this year. Pt would like to establish with [facility] - referral placed today.       Relevant Orders   Ambulatory referral to Gastroenterology    Comprehensive metabolic panel   CBC with Differential/Platelet   TSH   Healthcare maintenance - Primary    Preventative protocols reviewed and updated unless pt declined. Discussed healthy diet and lifestyle.        Other Visit Diagnoses    Screen for STD (sexually transmitted disease)       Relevant Orders   HIV Antibody (routine testing w rflx)   RPR   C. trachomatis/N. gonorrhoeae RNA       No orders of the defined types were placed in this encounter.  Orders Placed This Encounter  Procedures  . C. trachomatis/N. gonorrhoeae RNA    Standing Status:   Future    Standing Expiration Date:   05/11/2020  . Comprehensive metabolic panel    Standing Status:   Future    Standing Expiration Date:   05/11/2020  . CBC with Differential/Platelet    Standing Status:   Future    Standing Expiration Date:   05/11/2020  . TSH    Standing Status:   Future    Standing Expiration Date:   05/11/2020  . HIV Antibody (routine testing w rflx)    Standing Status:   Future    Standing Expiration Date:   05/11/2020  . RPR    Standing Status:   Future    Standing Expiration Date:   05/11/2020  . Ambulatory referral to Gastroenterology    Referral Priority:   Routine    Referral Type:   Consultation    Referral Reason:   Specialty Services Required    Number of Visits Requested:   1    I discussed the assessment and treatment plan with the patient. The patient was provided an opportunity to ask questions and all were answered. The patient agreed with the plan and demonstrated an understanding of the instructions. The patient was advised to call back or seek an in-person evaluation if the symptoms worsen or if the condition fails to improve as anticipated.  Follow up plan: Return in about 1 year (around 05/11/2020) for annual exam, prior fasting for blood work.  Eustaquio BoydenJavier Bion Todorov, MD

## 2019-05-12 NOTE — Assessment & Plan Note (Signed)
He is currently taking Delzicol 400mg  4 cap TID during active UC flare, then will return to 2 cap TID. Was being followed by Dr Levora Angel at [facility] until they became out of network this year. Pt would like to establish with [facility] - referral placed today.

## 2019-05-16 ENCOUNTER — Ambulatory Visit: Payer: BLUE CROSS/BLUE SHIELD

## 2019-05-16 ENCOUNTER — Other Ambulatory Visit (INDEPENDENT_AMBULATORY_CARE_PROVIDER_SITE_OTHER): Payer: BLUE CROSS/BLUE SHIELD

## 2019-05-16 DIAGNOSIS — Z113 Encounter for screening for infections with a predominantly sexual mode of transmission: Secondary | ICD-10-CM

## 2019-05-16 DIAGNOSIS — K51311 Ulcerative (chronic) rectosigmoiditis with rectal bleeding: Secondary | ICD-10-CM | POA: Diagnosis not present

## 2019-05-16 DIAGNOSIS — M1A0720 Idiopathic chronic gout, left ankle and foot, without tophus (tophi): Secondary | ICD-10-CM

## 2019-05-16 DIAGNOSIS — D500 Iron deficiency anemia secondary to blood loss (chronic): Secondary | ICD-10-CM

## 2019-05-16 LAB — CBC WITH DIFFERENTIAL/PLATELET
Basophils Absolute: 0 10*3/uL (ref 0.0–0.1)
Basophils Relative: 0.9 % (ref 0.0–3.0)
Eosinophils Absolute: 0.1 10*3/uL (ref 0.0–0.7)
Eosinophils Relative: 2.8 % (ref 0.0–5.0)
HCT: 43.4 % (ref 39.0–52.0)
Hemoglobin: 14.6 g/dL (ref 13.0–17.0)
Lymphocytes Relative: 35.5 % (ref 12.0–46.0)
Lymphs Abs: 1.8 10*3/uL (ref 0.7–4.0)
MCHC: 33.6 g/dL (ref 30.0–36.0)
MCV: 93.3 fl (ref 78.0–100.0)
Monocytes Absolute: 0.3 10*3/uL (ref 0.1–1.0)
Monocytes Relative: 6.4 % (ref 3.0–12.0)
Neutro Abs: 2.7 10*3/uL (ref 1.4–7.7)
Neutrophils Relative %: 54.4 % (ref 43.0–77.0)
Platelets: 220 10*3/uL (ref 150.0–400.0)
RBC: 4.65 Mil/uL (ref 4.22–5.81)
RDW: 12.7 % (ref 11.5–15.5)
WBC: 5 10*3/uL (ref 4.0–10.5)

## 2019-05-16 LAB — COMPREHENSIVE METABOLIC PANEL
ALT: 34 U/L (ref 0–53)
AST: 27 U/L (ref 0–37)
Albumin: 4.3 g/dL (ref 3.5–5.2)
Alkaline Phosphatase: 87 U/L (ref 39–117)
BUN: 16 mg/dL (ref 6–23)
CO2: 28 mEq/L (ref 19–32)
Calcium: 9.6 mg/dL (ref 8.4–10.5)
Chloride: 101 mEq/L (ref 96–112)
Creatinine, Ser: 1.39 mg/dL (ref 0.40–1.50)
GFR: 73.13 mL/min (ref >60.00)
Glucose, Bld: 88 mg/dL (ref 70–99)
Potassium: 4.6 mEq/L (ref 3.5–5.1)
Sodium: 136 mEq/L (ref 135–145)
Total Bilirubin: 0.8 mg/dL (ref 0.2–1.2)
Total Protein: 6.8 g/dL (ref 6.0–8.3)

## 2019-05-16 LAB — URIC ACID: Uric Acid, Serum: 5.5 mg/dL (ref 4.0–7.8)

## 2019-05-16 LAB — TSH: TSH: 0.92 u[IU]/mL (ref 0.35–4.50)

## 2019-05-16 NOTE — Addendum Note (Signed)
Addended by: Ellamae Sia on: 05/16/2019 11:02 AM   Modules accepted: Orders

## 2019-05-17 LAB — RPR: RPR Ser Ql: NONREACTIVE

## 2019-05-17 LAB — HIV ANTIBODY (ROUTINE TESTING W REFLEX): HIV 1&2 Ab, 4th Generation: NONREACTIVE

## 2019-10-18 ENCOUNTER — Other Ambulatory Visit: Payer: Self-pay

## 2019-10-18 DIAGNOSIS — Z20822 Contact with and (suspected) exposure to covid-19: Secondary | ICD-10-CM

## 2019-10-19 LAB — NOVEL CORONAVIRUS, NAA: SARS-CoV-2, NAA: NOT DETECTED

## 2019-10-20 ENCOUNTER — Telehealth: Payer: Self-pay | Admitting: General Practice

## 2019-10-20 NOTE — Telephone Encounter (Signed)
Negative COVID results given. Patient results "NOT Detected." Caller expressed understanding. ° °

## 2019-12-14 ENCOUNTER — Ambulatory Visit: Payer: BLUE CROSS/BLUE SHIELD | Admitting: Family Medicine

## 2020-07-04 ENCOUNTER — Ambulatory Visit (INDEPENDENT_AMBULATORY_CARE_PROVIDER_SITE_OTHER): Payer: BLUE CROSS/BLUE SHIELD | Admitting: Family Medicine

## 2020-07-04 ENCOUNTER — Other Ambulatory Visit: Payer: Self-pay

## 2020-07-04 ENCOUNTER — Encounter: Payer: Self-pay | Admitting: Family Medicine

## 2020-07-04 VITALS — BP 114/68 | HR 68 | Temp 97.9°F | Ht 70.0 in | Wt 204.0 lb

## 2020-07-04 DIAGNOSIS — K51311 Ulcerative (chronic) rectosigmoiditis with rectal bleeding: Secondary | ICD-10-CM

## 2020-07-04 DIAGNOSIS — Z0000 Encounter for general adult medical examination without abnormal findings: Secondary | ICD-10-CM | POA: Diagnosis not present

## 2020-07-04 DIAGNOSIS — Z113 Encounter for screening for infections with a predominantly sexual mode of transmission: Secondary | ICD-10-CM | POA: Diagnosis not present

## 2020-07-04 DIAGNOSIS — B078 Other viral warts: Secondary | ICD-10-CM

## 2020-07-04 DIAGNOSIS — R229 Localized swelling, mass and lump, unspecified: Secondary | ICD-10-CM

## 2020-07-04 LAB — COMPREHENSIVE METABOLIC PANEL
ALT: 30 U/L (ref 0–53)
AST: 29 U/L (ref 0–37)
Albumin: 4.1 g/dL (ref 3.5–5.2)
Alkaline Phosphatase: 80 U/L (ref 39–117)
BUN: 19 mg/dL (ref 6–23)
CO2: 31 mEq/L (ref 19–32)
Calcium: 9.5 mg/dL (ref 8.4–10.5)
Chloride: 103 mEq/L (ref 96–112)
Creatinine, Ser: 1.48 mg/dL (ref 0.40–1.50)
GFR: 67.5 mL/min (ref >60.00)
Glucose, Bld: 94 mg/dL (ref 70–99)
Potassium: 4.3 mEq/L (ref 3.5–5.1)
Sodium: 137 mEq/L (ref 135–145)
Total Bilirubin: 0.8 mg/dL (ref 0.2–1.2)
Total Protein: 6.7 g/dL (ref 6.0–8.3)

## 2020-07-04 LAB — CBC WITH DIFFERENTIAL/PLATELET
Basophils Absolute: 0 10*3/uL (ref 0.0–0.1)
Basophils Relative: 0.5 % (ref 0.0–3.0)
Eosinophils Absolute: 0.3 10*3/uL (ref 0.0–0.7)
Eosinophils Relative: 3.8 % (ref 0.0–5.0)
HCT: 40.1 % (ref 39.0–52.0)
Hemoglobin: 13.7 g/dL (ref 13.0–17.0)
Lymphocytes Relative: 23.2 % (ref 12.0–46.0)
Lymphs Abs: 1.6 10*3/uL (ref 0.7–4.0)
MCHC: 34.1 g/dL (ref 30.0–36.0)
MCV: 94.1 fl (ref 78.0–100.0)
Monocytes Absolute: 0.5 10*3/uL (ref 0.1–1.0)
Monocytes Relative: 7 % (ref 3.0–12.0)
Neutro Abs: 4.6 10*3/uL (ref 1.4–7.7)
Neutrophils Relative %: 65.5 % (ref 43.0–77.0)
Platelets: 229 10*3/uL (ref 150.0–400.0)
RBC: 4.26 Mil/uL (ref 4.22–5.81)
RDW: 12.5 % (ref 11.5–15.5)
WBC: 7 10*3/uL (ref 4.0–10.5)

## 2020-07-04 LAB — LIPID PANEL
Cholesterol: 159 mg/dL (ref 0–200)
HDL: 49.5 mg/dL (ref >39.00)
LDL Cholesterol: 93 mg/dL (ref 0–99)
NonHDL: 109.41
Total CHOL/HDL Ratio: 3
Triglycerides: 83 mg/dL (ref 0.0–149.0)
VLDL: 16.6 mg/dL (ref 0.0–40.0)

## 2020-07-04 LAB — TSH: TSH: 1.47 u[IU]/mL (ref 0.35–4.50)

## 2020-07-04 NOTE — Assessment & Plan Note (Signed)
Preventative protocols reviewed and updated unless pt declined. Discussed healthy diet and lifestyle.  

## 2020-07-04 NOTE — Assessment & Plan Note (Addendum)
Discussed OTC treatment with salicylic acid (ie duofilm). Return for cryotherapy if fails to resolve.

## 2020-07-04 NOTE — Patient Instructions (Addendum)
Labs today I do recommend COVID vaccine.  Could try salicylic acid or wart medicine over the counter to wart on right thigh.  Area on skin of forehead feels like small nodule under the skin - we can watch this for now, try warm compresses. Let me know if changing or growing.   Health Maintenance, Male Adopting a healthy lifestyle and getting preventive care are important in promoting health and wellness. Ask your health care provider about:  The right schedule for you to have regular tests and exams.  Things you can do on your own to prevent diseases and keep yourself healthy. What should I know about diet, weight, and exercise? Eat a healthy diet   Eat a diet that includes plenty of vegetables, fruits, low-fat dairy products, and lean protein.  Do not eat a lot of foods that are high in solid fats, added sugars, or sodium. Maintain a healthy weight Body mass index (BMI) is a measurement that can be used to identify possible weight problems. It estimates body fat based on height and weight. Your health care provider can help determine your BMI and help you achieve or maintain a healthy weight. Get regular exercise Get regular exercise. This is one of the most important things you can do for your health. Most adults should:  Exercise for at least 150 minutes each week. The exercise should increase your heart rate and make you sweat (moderate-intensity exercise).  Do strengthening exercises at least twice a week. This is in addition to the moderate-intensity exercise.  Spend less time sitting. Even light physical activity can be beneficial. Watch cholesterol and blood lipids Have your blood tested for lipids and cholesterol at 30 years of age, then have this test every 5 years. You may need to have your cholesterol levels checked more often if:  Your lipid or cholesterol levels are high.  You are older than 30 years of age.  You are at high risk for heart disease. What should I  know about cancer screening? Many types of cancers can be detected early and may often be prevented. Depending on your health history and family history, you may need to have cancer screening at various ages. This may include screening for:  Colorectal cancer.  Prostate cancer.  Skin cancer.  Lung cancer. What should I know about heart disease, diabetes, and high blood pressure? Blood pressure and heart disease  High blood pressure causes heart disease and increases the risk of stroke. This is more likely to develop in people who have high blood pressure readings, are of African descent, or are overweight.  Talk with your health care provider about your target blood pressure readings.  Have your blood pressure checked: ? Every 3-5 years if you are 28-73 years of age. ? Every year if you are 87 years old or older.  If you are between the ages of 79 and 72 and are a current or former smoker, ask your health care provider if you should have a one-time screening for abdominal aortic aneurysm (AAA). Diabetes Have regular diabetes screenings. This checks your fasting blood sugar level. Have the screening done:  Once every three years after age 87 if you are at a normal weight and have a low risk for diabetes.  More often and at a younger age if you are overweight or have a high risk for diabetes. What should I know about preventing infection? Hepatitis B If you have a higher risk for hepatitis B, you should be screened  for this virus. Talk with your health care provider to find out if you are at risk for hepatitis B infection. Hepatitis C Blood testing is recommended for:  Everyone born from [birth year] through 1966.  Anyone with known risk factors for hepatitis C. Sexually transmitted infections (STIs)  You should be screened each year for STIs, including gonorrhea and chlamydia, if: ? You are sexually active and are younger than 30 years of age. ? You are older than 30 years of age and  your health care provider tells you that you are at risk for this type of infection. ? Your sexual activity has changed since you were last screened, and you are at increased risk for chlamydia or gonorrhea. Ask your health care provider if you are at risk.  Ask your health care provider about whether you are at high risk for HIV. Your health care provider may recommend a prescription medicine to help prevent HIV infection. If you choose to take medicine to prevent HIV, you should first get tested for HIV. You should then be tested every 3 months for as long as you are taking the medicine. Follow these instructions at home: Lifestyle  Do not use any products that contain nicotine or tobacco, such as cigarettes, e-cigarettes, and chewing tobacco. If you need help quitting, ask your health care provider.  Do not use street drugs.  Do not share needles.  Ask your health care provider for help if you need support or information about quitting drugs. Alcohol use  Do not drink alcohol if your health care provider tells you not to drink.  If you drink alcohol: ? Limit how much you have to 0-2 drinks a day. ? Be aware of how much alcohol is in your drink. In the [place] one drink equals one 12 oz bottle of beer (355 mL), one 5 oz glass of wine (148 mL), or one 1 oz glass of hard liquor (44 mL). General instructions  Schedule regular health, dental, and eye exams.  Stay current with your vaccines.  Tell your health care provider if: ? You often feel depressed. ? You have ever been abused or do not feel safe at home. Summary  Adopting a healthy lifestyle and getting preventive care are important in promoting health and wellness.  Follow your health care provider's instructions about healthy diet, exercising, and getting tested or screened for diseases.  Follow your health care provider's instructions on monitoring your cholesterol and blood pressure. This information is not intended to  replace advice given to you by your health care provider. Make sure you discuss any questions you have with your health care provider. Document Revised: 11/16/2018 Document Reviewed: 11/16/2018 Elsevier Patient Education  2020 [employer].

## 2020-07-04 NOTE — Assessment & Plan Note (Signed)
To right forehead.  Declines derm or plastic surgery eval, declines removal at this time.  Will monitor for now.

## 2020-07-04 NOTE — Progress Notes (Signed)
This visit was conducted in person.  BP 114/68 (BP Location: Left Arm, Patient Position: Sitting, Cuff Size: Normal)   Pulse 68   Temp 97.9 F (36.6 C)   Ht 5' 10"  (1.778 m)   Wt (!) 204 lb (92.5 kg)   SpO2 96%   BMI 29.27 kg/m    CC: CPE Subjective:    Patient ID: Kenneth Terrell, male    DOB: [birth year], 29 y.o.   MRN: [id number]  HPI: Kenneth Terrell is a 29 y.o. male presenting on 07/04/2020 for Annual Exam   Ulcerative colitis - has seen [facility]. Did see [facility] temporarily. Then followed by [facility] for last few years but now out of network so he established with [facility] ([facility]). Last seen 12/2019 - rec continue delzicol 1.6g TID PO with canasa suppositories BID. Did see a few months ago. Currently in flare.   He is noticing burnt smell over last few months, unclear why. Otherwise sense of taste and smell intact. No fevers/chills, HA, sinus pressure or congestion. Had bad headache x2 in March, tylenol didn't help.    Notes bump to forehead without inciting trauma/injury. Present since 12/2019.   Fasting today - due for labs.   Preventative: Flu - declines  Tdap 2016  COVID vaccine - has not received. Encouraged he proceed with this. Declines. Seat belt use discussed.  Sunscreen use discussed.No suspicious moles on skin.  Ex- smoker quit 2011  Alcohol - none  Rec drugs - none  Eye exam - yearly  Dentist - q6 mo  Currently sexually active, 1 partner in last year. Monogamous. Requests STD screen.   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     Relevant past medical, surgical, family and social history reviewed and updated as indicated. Interim medical history since our last visit reviewed. Allergies and medications  reviewed and updated. Outpatient Medications Prior to Visit  Medication Sig Dispense Refill  . Mesalamine (DELZICOL) 400 MG CPDR DR capsule Take 2 capsules (800 mg total) by mouth 3 (three) times daily.    . mesalamine (CANASA) 1000 MG suppository Place 1 suppository (1,000 mg total) rectally 2 (two) times daily.     No facility-administered medications prior to visit.     Per HPI unless specifically indicated in ROS section below Review of Systems  Constitutional: Negative for activity change, appetite change, chills, fatigue, fever and unexpected weight change.  HENT: Negative for hearing loss.   Eyes: Negative for visual disturbance.  Respiratory: Negative for cough, chest tightness, shortness of breath and wheezing.   Cardiovascular: Negative for chest pain, palpitations and leg swelling.  Gastrointestinal: Positive for blood in stool and diarrhea. Negative for abdominal distention, abdominal pain, constipation, nausea and vomiting.  Genitourinary: Negative for difficulty urinating and hematuria.  Musculoskeletal: Negative for arthralgias, myalgias and neck pain.  Skin: Negative for rash.  Neurological: Positive for headaches (see HPI). Negative for dizziness (occasional), seizures and syncope.  Hematological: Negative for adenopathy. Does not bruise/bleed easily.  Psychiatric/Behavioral: Negative for dysphoric mood. The patient is not nervous/anxious.    Objective:  BP 114/68 (BP Location: Left Arm, Patient Position: Sitting, Cuff Size: Normal)   Pulse 68   Temp 97.9 F (36.6 C)   Ht 5' 10"  (1.778 m)   Wt (!) 204 lb (  92.5 kg)   SpO2 96%   BMI 29.27 kg/m   Wt Readings from Last 3 Encounters:  07/04/20 (!) 204 lb (92.5 kg)  01/24/19 199 lb 3.2 oz (90.4 kg)  01/19/19 197 lb 6.4 oz (89.5 kg)      Physical Exam Vitals and nursing note reviewed.  Constitutional:      General: He is not in acute distress.    Appearance: Normal appearance. He is well-developed. He is not  ill-appearing.  HENT:     Head: Normocephalic and atraumatic.     Comments: Mobile bump on R forehead    Right Ear: Hearing, tympanic membrane, ear canal and external ear normal.     Left Ear: Hearing, tympanic membrane, ear canal and external ear normal.  Eyes:     General: No scleral icterus.    Extraocular Movements: Extraocular movements intact.     Conjunctiva/sclera: Conjunctivae normal.     Pupils: Pupils are equal, round, and reactive to light.  Cardiovascular:     Rate and Rhythm: Normal rate and regular rhythm.     Pulses: Normal pulses.          Radial pulses are 2+ on the right side and 2+ on the left side.     Heart sounds: Normal heart sounds. No murmur heard.   Pulmonary:     Effort: Pulmonary effort is normal. No respiratory distress.     Breath sounds: Normal breath sounds. No wheezing, rhonchi or rales.  Abdominal:     General: Abdomen is flat. Bowel sounds are normal. There is no distension.     Palpations: Abdomen is soft. There is no mass.     Tenderness: There is no abdominal tenderness. There is no guarding or rebound.     Hernia: No hernia is present.  Musculoskeletal:        General: Normal range of motion.     Cervical back: Normal range of motion and neck supple.     Right lower leg: No edema.     Left lower leg: No edema.  Lymphadenopathy:     Head:     Right side of head: No submental, submandibular, tonsillar, preauricular or posterior auricular adenopathy.     Left side of head: No submental, submandibular, tonsillar, preauricular or posterior auricular adenopathy.     Cervical: No cervical adenopathy.     Upper Body:     Right upper body: No supraclavicular adenopathy.     Left upper body: No supraclavicular adenopathy.  Skin:    General: Skin is warm and dry.     Findings: No rash.     Comments: Flat topped wart to R distal thigh  Neurological:     General: No focal deficit present.     Mental Status: He is alert and oriented to person,  place, and time.     Comments: CN grossly intact, station and gait intact  Psychiatric:        Mood and Affect: Mood normal.        Behavior: Behavior normal.        Thought Content: Thought content normal.        Judgment: Judgment normal.       Results for orders placed or performed in visit on 10/18/19  Novel Coronavirus, NAA ([facility])   Specimen: Nasopharyngeal(NP) swabs in vial transport medium   NASOPHARYNGE  TESTING  Result Value Ref Range   SARS-CoV-2, NAA Not Detected Not Detected   Assessment & Plan:  This  visit occurred during the SARS-CoV-2 public health emergency.  Safety protocols were in place, including screening questions prior to the visit, additional usage of staff PPE, and extensive cleaning of exam room while observing appropriate contact time as indicated for disinfecting solutions.   Problem List Items Addressed This Visit    Ulcerative colitis ([place])    Appreciate GI care. Reviewed current regimen. Currently having a mild flare.       Relevant Orders   Lipid panel   Comprehensive metabolic panel   CBC with Differential/Platelet   TSH   Skin nodule    To right forehead.  Declines derm or plastic surgery eval, declines removal at this time.  Will monitor for now.       Healthcare maintenance - Primary    Preventative protocols reviewed and updated unless pt declined. Discussed healthy diet and lifestyle.       Common wart    Discussed OTC treatment with salicylic acid (ie duofilm). Return for cryotherapy if fails to resolve.        Other Visit Diagnoses    Screen for STD (sexually transmitted disease)       Relevant Orders   HIV Antibody (routine testing w rflx)   RPR   C. trachomatis/N. gonorrhoeae RNA       No orders of the defined types were placed in this encounter.  Orders Placed This Encounter  Procedures  . C. trachomatis/N. gonorrhoeae RNA  . Lipid panel  . Comprehensive metabolic panel  . CBC with Differential/Platelet  .  TSH  . HIV Antibody (routine testing w rflx)  . RPR    Patient instructions: Labs today I do recommend COVID vaccine.  Could try salicylic acid or wart medicine over the counter to wart on right thigh.  Area on skin of forehead feels like small nodule under the skin - we can watch this for now, try warm compresses. Let me know if changing or growing.   Follow up plan: Return if symptoms worsen or fail to improve.  [name], MD

## 2020-07-04 NOTE — Assessment & Plan Note (Signed)
Appreciate GI care. Reviewed current regimen. Currently having a mild flare.

## 2020-07-05 LAB — C. TRACHOMATIS/N. GONORRHOEAE RNA
C. trachomatis RNA, TMA: NOT DETECTED
N. gonorrhoeae RNA, TMA: NOT DETECTED

## 2020-07-05 LAB — RPR: RPR Ser Ql: NONREACTIVE

## 2020-07-05 LAB — HIV ANTIBODY (ROUTINE TESTING W REFLEX): HIV 1&2 Ab, 4th Generation: NONREACTIVE

## 2020-08-06 ENCOUNTER — Telehealth: Payer: Self-pay

## 2020-08-06 ENCOUNTER — Ambulatory Visit (INDEPENDENT_AMBULATORY_CARE_PROVIDER_SITE_OTHER): Payer: BLUE CROSS/BLUE SHIELD | Admitting: Physician Assistant

## 2020-08-06 ENCOUNTER — Telehealth: Payer: Self-pay | Admitting: *Deleted

## 2020-08-06 ENCOUNTER — Other Ambulatory Visit: Payer: Self-pay

## 2020-08-06 ENCOUNTER — Encounter: Payer: Self-pay | Admitting: Physician Assistant

## 2020-08-06 DIAGNOSIS — M25552 Pain in left hip: Secondary | ICD-10-CM | POA: Diagnosis not present

## 2020-08-06 DIAGNOSIS — M25512 Pain in left shoulder: Secondary | ICD-10-CM

## 2020-08-06 DIAGNOSIS — R200 Anesthesia of skin: Secondary | ICD-10-CM

## 2020-08-06 DIAGNOSIS — R208 Other disturbances of skin sensation: Secondary | ICD-10-CM | POA: Diagnosis not present

## 2020-08-06 MED ORDER — PREDNISONE 20 MG PO TABS: 40.0000 mg | ORAL_TABLET | Freq: Every day | ORAL | 0 refills | Status: DC

## 2020-08-06 MED ORDER — CYCLOBENZAPRINE HCL 10 MG PO TABS
10.0000 mg | ORAL_TABLET | Freq: Three times a day (TID) | ORAL | 0 refills | Status: DC | PRN
Start: 2020-08-06 — End: 2020-09-20
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
## 2020-08-06 NOTE — Patient Instructions (Addendum)
It was great to see you!  I will be in touch by the end of the day with a plan for xrays and/or appointment to sports medicine.  I have sent in oral prednisone for you to take for inflammation and oral flexeril for muscle spasms.  Take care,  Inda Coke PA-C

## 2020-08-06 NOTE — Telephone Encounter (Signed)
[facility] called stating that the patients insurance requires them to see a [facility] provider for it to be considered in network. She tried to explain it to the pt but she thinks he was confused.

## 2020-08-06 NOTE — Telephone Encounter (Signed)
Spoke to pt told him Kenneth Terrell would like for you to go get x-rays today due to can not get in with Ortho till next week. Pt verbalized understanding and said Ortho was going to cancel my appt due to out of network. Told pt okay was not aware of that we will go ahead and put new referral in for Sports Medicine due to insurance and someone will contact you to schedule an appt. Pt verbalized understanding. Address given to pt where to get x-rays done.

## 2020-08-06 NOTE — Telephone Encounter (Signed)
It was already taking care of. Pt has been scheduled at the correct place.

## 2020-08-07 ENCOUNTER — Ambulatory Visit (INDEPENDENT_AMBULATORY_CARE_PROVIDER_SITE_OTHER): Payer: BLUE CROSS/BLUE SHIELD | Admitting: Family Medicine

## 2020-08-07 ENCOUNTER — Encounter: Payer: Self-pay | Admitting: Family Medicine

## 2020-08-07 ENCOUNTER — Ambulatory Visit (INDEPENDENT_AMBULATORY_CARE_PROVIDER_SITE_OTHER): Payer: BLUE CROSS/BLUE SHIELD

## 2020-08-07 VITALS — BP 102/78 | HR 48 | Ht 70.0 in | Wt 201.0 lb

## 2020-08-07 DIAGNOSIS — M7062 Trochanteric bursitis, left hip: Secondary | ICD-10-CM

## 2020-08-07 DIAGNOSIS — M5432 Sciatica, left side: Secondary | ICD-10-CM

## 2020-08-07 DIAGNOSIS — M25512 Pain in left shoulder: Secondary | ICD-10-CM | POA: Diagnosis not present

## 2020-08-07 MED ORDER — GABAPENTIN 300 MG PO CAPS
300.0000 mg | ORAL_CAPSULE | Freq: Three times a day (TID) | ORAL | 3 refills | Status: DC | PRN
Start: 2020-08-07 — End: 2020-09-20
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
## 2020-08-07 NOTE — Patient Instructions (Addendum)
Thank you for coming in today. Work on Location manager.   OK to climb when you feel safe.   Plan for PT. That will get you better sooner.   Get xray today.   Use gabapentin as needed at bedtime or up to 3x daily for nerve pain.    Recheck in 4 weeks.  Let me know sooner if this is not working.

## 2020-08-08 NOTE — Progress Notes (Signed)
X-ray lumbar spine shows some mild shifting of the vertebrae at L5-S1.  This could potentially cause a pinched nerve in your leg.  If not better with physical therapy we will proceed with MRI to further characterize back and leg pain.

## 2020-08-08 NOTE — Progress Notes (Signed)
X-ray left shoulder is normal appearing to radiology.

## 2020-08-09 ENCOUNTER — Telehealth: Payer: Self-pay | Admitting: Family Medicine

## 2020-08-09 NOTE — Telephone Encounter (Signed)
Called patient. He asked if he could return to work on Thursday, September 9th. The letter can be uploaded on his MyChart when ready.  He is scheduled to start PT on Tuesday.

## 2020-08-09 NOTE — Telephone Encounter (Signed)
Happy to extend work note if needed.  What do would you like to try to return to work?  I think best bet at this point is physical therapy for your back and hip and shoulder.  Physical therapy ordered.  You should be able to start that soon.

## 2020-08-09 NOTE — Telephone Encounter (Signed)
Patient called stating that he was written out of work for 1-7 days. 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 He said it was not a sharp pain but he can feel a stretch in his back that made it hard to do his job and was concerned about it. He asked if Dr Georgina Snell would be willing to write him out of work longer? He also asked if he should be seeing a chiropractor or what Dr Georgina Snell would suggest?

## 2020-08-13 ENCOUNTER — Encounter: Payer: Self-pay | Admitting: Family Medicine

## 2020-08-13 ENCOUNTER — Other Ambulatory Visit: Payer: Self-pay

## 2020-08-13 ENCOUNTER — Encounter: Payer: Self-pay | Admitting: Physical Therapy

## 2020-08-13 ENCOUNTER — Ambulatory Visit: Payer: BLUE CROSS/BLUE SHIELD | Admitting: Family Medicine

## 2020-08-13 ENCOUNTER — Ambulatory Visit (INDEPENDENT_AMBULATORY_CARE_PROVIDER_SITE_OTHER): Payer: BLUE CROSS/BLUE SHIELD | Admitting: Physical Therapy

## 2020-08-13 DIAGNOSIS — M25512 Pain in left shoulder: Secondary | ICD-10-CM

## 2020-08-13 DIAGNOSIS — M5442 Lumbago with sciatica, left side: Secondary | ICD-10-CM | POA: Diagnosis not present

## 2020-08-13 NOTE — Telephone Encounter (Signed)
Letter sent via my chart

## 2020-08-15 ENCOUNTER — Encounter: Payer: Self-pay | Admitting: Physical Therapy

## 2020-08-15 ENCOUNTER — Other Ambulatory Visit: Payer: Self-pay

## 2020-08-15 ENCOUNTER — Ambulatory Visit (INDEPENDENT_AMBULATORY_CARE_PROVIDER_SITE_OTHER): Payer: BLUE CROSS/BLUE SHIELD | Admitting: Physical Therapy

## 2020-08-15 DIAGNOSIS — M25512 Pain in left shoulder: Secondary | ICD-10-CM

## 2020-08-15 DIAGNOSIS — M5442 Lumbago with sciatica, left side: Secondary | ICD-10-CM
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
## 2020-08-16 NOTE — Therapy (Signed)
[facility] [address] [place], [place], [address] Phone: [phone]   Fax:  [phone]  Physical Therapy Treatment  Patient Details  Name: Kenneth Terrell MRN: [id number] Date of Birth: April 05, 1991 Referring Provider (PT): [name]   Encounter Date: 08/15/2020   PT End of Session - 08/15/20 1444    Visit Number 2    Number of Visits 12    Date for PT Re-Evaluation 09/24/20    Authorization Type BCBS    PT Start Time 1435    PT Stop Time 1515    PT Time Calculation (min) 40 min    Activity Tolerance Patient tolerated treatment well    Behavior During Therapy [facility] for tasks assessed/performed           Past Medical History:  Diagnosis Date  . ADHD 01/11/2008   4 YOA   . BIPOLAR DISORDER UNSPECIFIED 01/11/2008   actually thought more depression - ?misdiagnosis  . History of chlamydia 2008   treated  . Ulcerative colitis ([place]) 03/2016   by colonoscopy    Past Surgical History:  Procedure Laterality Date  . [facility] Admission  1999   x 1 week  . COLONOSCOPY  03/2016   Ulcerative colitis by biopsy ([facility])  . EXTERNAL EAR SURGERY    . KELOID EXCISION  10/2007   left ear (Dr. Towanda Malkin)    There were no vitals filed for this visit.   Subjective Assessment - 08/15/20 1442    Subjective States shoulderdoing well, minimal pain. Back still sore, less pain in LE    Currently in Pain? Yes    Pain Score 2     Pain Location Shoulder    Pain Orientation Left    Pain Descriptors / Indicators Aching    Pain Type Acute pain    Pain Onset 1 to 4 weeks ago    Pain Frequency Intermittent    Pain Score 7    Pain Location Leg    Pain Orientation Left    Pain Descriptors / Indicators Tingling    Pain Type Acute pain    Pain Radiating Towards intermittent tingling in L LE.    Pain Onset 1 to 4 weeks ago    Pain Frequency Intermittent                             [facility] Adult PT Treatment/Exercise - 08/16/20  0001      Lumbar Exercises: Stretches   Single Knee to Chest Stretch 3 reps;30 seconds    Piriformis Stretch 2 reps;30 seconds    Other Lumbar Stretch Exercise childs pose 30 sec x 3;       Lumbar Exercises: Aerobic   Recumbent Bike L 2 x 9  min;       Lumbar Exercises: Standing   Row 20 reps    Theraband Level (Row) Level 4 (Blue)    Other Standing Lumbar Exercises hip abd x15 bil YTB       Lumbar Exercises: Supine   Clam 20 reps    Bent Knee Raise 20 reps    Bridge 20 reps      Lumbar Exercises: Quadruped   Madcat/Old Horse 20 reps    Opposite Arm/Leg Raise 20 reps    Plank 30 sec x 2;       Manual Therapy   Manual therapy comments skilled palpation and monitoring of soft tissue with dry needling  Soft tissue mobilization DTM and IASTM  to L lumbar paraspinals            Trigger Point Dry Needling - 08/16/20 0001    Consent Given? Yes    Education Handout Provided Yes    Muscles Treated Back/Hip Lumbar multifidi;Thoracic multifidi    Lumbar multifidi Response Palpable increased muscle length   L   Thoracic multifidi response Palpable increased muscle length   L                 PT Short Term Goals - 08/15/20 0951      PT SHORT TERM GOAL #1   Title Pt to be independent with initial HEP    Time 2    Period Weeks    Status New    Target Date 08/27/20             PT Long Term Goals - 08/15/20 0952      PT LONG TERM GOAL #1   Title Pt to be independent with final HEP     Time 6    Period Weeks    Status New    Target Date 09/24/20      PT LONG TERM GOAL #2   Title Pt to demo decreased pain in back/LE to 0-1/10 with activity    Time 6    Period Weeks    Status New    Target Date 09/24/20      PT LONG TERM GOAL #3   Title Pt to demo proper squat, lift mechanics, to improve work duties and reduce risk for injury.     Time 6    Period Weeks    Status New    Target Date 09/24/20      PT LONG TERM GOAL #4   Title Pt to report decreaesd  pain in L shoulder with elevation, lifting, carrying, to improve ability for return to work duties.    Time 6    Period Weeks    Status New    Target Date 09/24/20                 Plan - 08/16/20 1216    Clinical Impression Statement Pt with most soreness in L low thoracic, high lumbar region today. Addressed with manual and DN. Ther ex progressed for strength and mobility. Reviwed activities to avoid with exercise at this time.    Examination-Activity Limitations Locomotion Level;Reach Overhead;Carry;Sleep;Stairs;Stand;Lift    Examination-Participation Restrictions Cleaning;Occupation;Community Activity;Driving;Yard Work    Stability/Clinical Decision Making Stable/Uncomplicated    Rehab Potential Good    PT Frequency 2x / week    PT Duration 6 weeks    PT Treatment/Interventions ADLs/Self Care Home Management;Cryotherapy;Electrical Stimulation;Iontophoresis 27m/ml Dexamethasone;Moist Heat;Traction;Ultrasound;DME Instruction;Gait training;Neuromuscular re-education;Balance training;Functional mobility training;Therapeutic activities;Therapeutic exercise;Stair training;Patient/family education;Orthotic Fit/Training;Taping;Splinting;Manual techniques;Passive range of motion;Dry needling;Spinal Manipulations;Joint Manipulations    Consulted and Agree with Plan of Care Patient           Patient will benefit from skilled therapeutic intervention in order to improve the following deficits and impairments:  Decreased range of motion, Increased muscle spasms, Decreased endurance, Decreased activity tolerance, Pain, Decreased balance, Impaired flexibility, Improper body mechanics, Decreased strength, Decreased mobility  Visit Diagnosis: Acute left-sided low back pain with left-sided sciatica  Acute pain of left shoulder     Problem List Patient Active Problem List   Diagnosis Date Noted  . Skin nodule 07/04/2020  . Common wart 07/04/2020  . Anemia due to blood loss 02/19/2017  .  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 Ulcerative colitis ([place]) 03/07/2016  . Genital warts 12/26/2013  . Healthcare maintenance 01/16/2013  . Molluscum contagiosum 01/16/2013  . Gynecomastia 10/19/2012  . Left hip pain 07/18/2012  . BIPOLAR DISORDER UNSPECIFIED 01/11/2008  . Attention deficit hyperactivity disorder (ADHD) 01/11/2008    Lyndee Hensen, PT, DPT 12:20 PM  08/16/20    [facility] [place], [place], [address] Phone: [phone]   Fax:  [phone]  Name: Kenneth Terrell MRN: [id number] Date of Birth: [birth year]

## 2020-08-20 ENCOUNTER — Other Ambulatory Visit: Payer: Self-pay

## 2020-08-20 ENCOUNTER — Encounter: Payer: Self-pay | Admitting: Physical Therapy

## 2020-08-20 ENCOUNTER — Ambulatory Visit (INDEPENDENT_AMBULATORY_CARE_PROVIDER_SITE_OTHER): Payer: BLUE CROSS/BLUE SHIELD | Admitting: Physical Therapy

## 2020-08-20 DIAGNOSIS — M25512 Pain in left shoulder: Secondary | ICD-10-CM | POA: Diagnosis not present

## 2020-08-20 DIAGNOSIS — M5442 Lumbago with sciatica, left side: Secondary | ICD-10-CM

## 2020-08-20 NOTE — Therapy (Signed)
Kenneth Terrell [address] [place], [place], [address] Phone: [phone]   Fax:  [phone]  Physical Therapy Treatment  Patient Details  Name: Kenneth Terrell MRN: [id number] Date of Birth: 04-13-1991 Referring Provider (PT): [name]   Encounter Date: 08/20/2020   PT End of Session - 08/20/20 0946    Visit Number 3    Number of Visits 12    Date for PT Re-Evaluation 09/24/20    Authorization Type BCBS    PT Start Time 0940    PT Stop Time 1019    PT Time Calculation (min) 39 min    Activity Tolerance Patient tolerated treatment well    Behavior During Therapy [facility] for tasks assessed/performed           Past Medical History:  Diagnosis Date  . ADHD 01/11/2008   4 YOA   . BIPOLAR DISORDER UNSPECIFIED 01/11/2008   actually thought more depression - ?misdiagnosis  . History of chlamydia 2008   treated  . Ulcerative colitis ([place]) 03/2016   by colonoscopy    Past Surgical History:  Procedure Laterality Date  . Charter Admission  1999   x 1 week  . COLONOSCOPY  03/2016   Ulcerative colitis by biopsy ([facility])  . EXTERNAL EAR SURGERY    . KELOID EXCISION  10/2007   left ear (Dr. Towanda Malkin)    There were no vitals filed for this visit.   Subjective Assessment - 08/20/20 0945    Subjective Pt states doing significantly better. Minimal leg or back pain.    Currently in Pain? Yes    Pain Score 1    Pain Location Leg    Pain Orientation Left    Pain Descriptors / Indicators Aching    Pain Type Acute pain    Pain Onset More than a month ago    Pain Frequency Intermittent                             [facility] Adult PT Treatment/Exercise - 08/20/20 0001      Lumbar Exercises: Stretches   Single Knee to Chest Stretch 3 reps;30 seconds    Press Ups 20 reps    Press Ups Limitations to hands     Piriformis Stretch 2 reps;30 seconds    Other Lumbar Stretch Exercise childs pose 30 sec x 3;       Lumbar  Exercises: Aerobic   Recumbent Bike L 2 x 8  min;       Lumbar Exercises: Standing   Row 20 reps    Theraband Level (Row) Level 4 (Blue)    Other Standing Lumbar Exercises --      Lumbar Exercises: Supine   Clam 20 reps    Clam Limitations Bl TB    Bent Knee Raise --    Bridge 20 reps      Lumbar Exercises: Quadruped   Madcat/Old Horse 20 reps    Opposite Arm/Leg Raise 20 reps    Plank 30 sec x 2;       Manual Therapy   Manual Therapy Manual Traction    Manual therapy comments --    Soft tissue mobilization --    Manual Traction long leg distraction x 3 min on L;                     PT Short Term Goals - 08/15/20 4193  PT SHORT TERM GOAL #1   Title Pt to be independent with initial HEP    Time 2    Period Weeks    Status New    Target Date 08/27/20             PT Long Term Goals - 08/15/20 0952      PT LONG TERM GOAL #1   Title Pt to be independent with final HEP     Time 6    Period Weeks    Status New    Target Date 09/24/20      PT LONG TERM GOAL #2   Title Pt to demo decreased pain in back/LE to 0-1/10 with activity    Time 6    Period Weeks    Status New    Target Date 09/24/20      PT LONG TERM GOAL #3   Title Pt to demo proper squat, lift mechanics, to improve work duties and reduce risk for injury.     Time 6    Period Weeks    Status New    Target Date 09/24/20      PT LONG TERM GOAL #4   Title Pt to report decreaesd pain in L shoulder with elevation, lifting, carrying, to improve ability for return to work duties.    Time 6    Period Weeks    Status New    Target Date 09/24/20                 Plan - 08/20/20 1029    Clinical Impression Statement Pt progressing well with pain. Improved ROM, and ability for ther ex. Able to progress ther ex for strengthening today, no pain. Plan to progress as tolerated.    Examination-Activity Limitations Locomotion Level;Reach Overhead;Carry;Sleep;Stairs;Stand;Lift     Examination-Participation Restrictions Cleaning;Occupation;Community Activity;Driving;Yard Work    Stability/Clinical Decision Making Stable/Uncomplicated    Rehab Potential Good    PT Frequency 2x / week    PT Duration 6 weeks    PT Treatment/Interventions ADLs/Self Care Home Management;Cryotherapy;Electrical Stimulation;Iontophoresis 55m/ml Dexamethasone;Moist Heat;Traction;Ultrasound;DME Instruction;Gait training;Neuromuscular re-education;Balance training;Functional mobility training;Therapeutic activities;Therapeutic exercise;Stair training;Patient/family education;Orthotic Fit/Training;Taping;Splinting;Manual techniques;Passive range of motion;Dry needling;Spinal Manipulations;Joint Manipulations    Consulted and Agree with Plan of Care Patient           Patient will benefit from skilled therapeutic intervention in order to improve the following deficits and impairments:  Decreased range of motion, Increased muscle spasms, Decreased endurance, Decreased activity tolerance, Pain, Decreased balance, Impaired flexibility, Improper body mechanics, Decreased strength, Decreased mobility  Visit Diagnosis: Acute left-sided low back pain with left-sided sciatica  Acute pain of left shoulder     Problem List Patient Active Problem List   Diagnosis Date Noted  . Skin nodule 07/04/2020  . Common wart 07/04/2020  . Anemia due to blood loss 02/19/2017  . 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 [place] 03/07/2016  . Genital warts 12/26/2013  . Healthcare maintenance 01/16/2013  . Molluscum contagiosum 01/16/2013  . Gynecomastia 10/19/2012  . Left hip pain 07/18/2012  . BIPOLAR DISORDER UNSPECIFIED 01/11/2008  . Attention deficit hyperactivity disorder (ADHD) 01/11/2008    LLyndee Terrell PT, DPT 12:02 PM  08/20/20    [place] [address]Phone:  [phone]  Fax:  [phone] Name: Kenneth CARRASMRN: [id number] of Birth: [birth year]

## 2020-08-22 ENCOUNTER — Telehealth: Payer: Self-pay | Admitting: Family Medicine

## 2020-08-22 NOTE — Telephone Encounter (Signed)
Ok with me 

## 2020-08-22 NOTE — Telephone Encounter (Signed)
Patient is requesting TOC from dr.gutirerrez to [facility] due to him moving and [place] office is closer to home  Please advise

## 2020-08-22 NOTE — Telephone Encounter (Signed)
Nice patient.  Needs to keep his GI appts for ulcerative colitis.  Ok to transfer.

## 2020-08-22 NOTE — Telephone Encounter (Signed)
Please see message. °

## 2020-08-26 NOTE — Telephone Encounter (Signed)
Noted  

## 2020-08-26 NOTE — Telephone Encounter (Signed)
LVM to schedule TOC with patient

## 2020-08-28 ENCOUNTER — Encounter: Payer: Self-pay | Admitting: Physician Assistant

## 2020-08-28 ENCOUNTER — Other Ambulatory Visit: Payer: Self-pay

## 2020-08-28 ENCOUNTER — Ambulatory Visit (INDEPENDENT_AMBULATORY_CARE_PROVIDER_SITE_OTHER): Payer: BLUE CROSS/BLUE SHIELD | Admitting: Physician Assistant

## 2020-08-28 ENCOUNTER — Ambulatory Visit (INDEPENDENT_AMBULATORY_CARE_PROVIDER_SITE_OTHER): Payer: BLUE CROSS/BLUE SHIELD | Admitting: Physical Therapy

## 2020-08-28 ENCOUNTER — Encounter: Payer: Self-pay | Admitting: Physical Therapy

## 2020-08-28 VITALS — BP 120/88 | HR 46 | Temp 98.1°F | Ht 70.0 in | Wt 206.2 lb

## 2020-08-28 DIAGNOSIS — R519 Headache, unspecified: Secondary | ICD-10-CM

## 2020-08-28 DIAGNOSIS — R413 Other amnesia: Secondary | ICD-10-CM | POA: Diagnosis not present

## 2020-08-28 DIAGNOSIS — M25512 Pain in left shoulder: Secondary | ICD-10-CM

## 2020-08-28 DIAGNOSIS — M5442 Lumbago with sciatica, left side: Secondary | ICD-10-CM | POA: Diagnosis not present

## 2020-08-28 NOTE — Patient Instructions (Signed)
It was great to see you!  Concussion clinic Friday 8:30am with Dr. Georgina Snell. This will be for concussion evaluation.  Take care,  Inda Coke PA-C

## 2020-08-28 NOTE — Therapy (Signed)
[facility] [address] [place], [place], [address] Phone: [phone]   Fax:  [phone]  Physical Therapy Treatment  Patient Details  Name: Kenneth Terrell MRN: [id number] Date of Birth: 11-Aug-1991 Referring Provider (PT): [name]   Encounter Date: 08/28/2020   PT End of Session - 08/28/20 1535    Visit Number 4    Number of Visits 12    Date for PT Re-Evaluation 09/24/20    Authorization Type BCBS    PT Start Time 1240    PT Stop Time 1300    PT Time Calculation (min) 20 min    Activity Tolerance Patient tolerated treatment well    Behavior During Therapy [facility] for tasks assessed/performed           Past Medical History:  Diagnosis Date  . ADHD 01/11/2008   4 YOA   . BIPOLAR DISORDER UNSPECIFIED 01/11/2008   actually thought more depression - ?misdiagnosis  . History of chlamydia 2008   treated  . Ulcerative colitis ([place]) 03/2016   by colonoscopy    Past Surgical History:  Procedure Laterality Date  . [facility] Admission  1999   x 1 week  . COLONOSCOPY  03/2016   Ulcerative colitis by biopsy ([facility])  . EXTERNAL EAR SURGERY    . KELOID EXCISION  10/2007   left ear (Dr. Towanda Malkin)    There were no vitals filed for this visit.   Subjective Assessment - 08/28/20 1534    Subjective Pt reports doing much better overall, did have increased pain over weekend when driving, up to 2/45, but has calmed down in last couple days. Has been able to exercise.    Currently in Pain? Yes    Pain Score 2    Pain Location Back    Pain Orientation Left    Pain Descriptors / Indicators Aching    Pain Type Acute pain                             [facility] Adult PT Treatment/Exercise - 08/28/20 0001      Lumbar Exercises: Stretches   Press Ups 20 reps    Press Ups Limitations to hands       Lumbar Exercises: Quadruped   Madcat/Old Horse 20 reps      Manual Therapy   Joint Mobilization PA mobs thoracic and lumbar  spine gr 3 ;     Soft tissue mobilization DTM and IASTM  to L lumbar paraspinals                    PT Short Term Goals - 08/15/20 0951      PT SHORT TERM GOAL #1   Title Pt to be independent with initial HEP    Time 2    Period Weeks    Status New    Target Date 08/27/20             PT Long Term Goals - 08/15/20 0952      PT LONG TERM GOAL #1   Title Pt to be independent with final HEP     Time 6    Period Weeks    Status New    Target Date 09/24/20      PT LONG TERM GOAL #2   Title Pt to demo decreased pain in back/LE to 0-1/10 with activity    Time 6    Period Weeks  Status New    Target Date 09/24/20      PT LONG TERM GOAL #3   Title Pt to demo proper squat, lift mechanics, to improve work duties and reduce risk for injury.     Time 6    Period Weeks    Status New    Target Date 09/24/20      PT LONG TERM GOAL #4   Title Pt to report decreaesd pain in L shoulder with elevation, lifting, carrying, to improve ability for return to work duties.    Time 6    Period Weeks    Status New    Target Date 09/24/20                 Plan - 08/28/20 1536    Clinical Impression Statement Pt with improving pain in last few days. Reports able to be active and exercise. L thoracic spine sore with DTM. No pain in low lumbar region. Plan to progress as tolerated.    Examination-Activity Limitations Locomotion Level;Reach Overhead;Carry;Sleep;Stairs;Stand;Lift    Examination-Participation Restrictions Cleaning;Occupation;Community Activity;Driving;Yard Work    Stability/Clinical Decision Making Stable/Uncomplicated    Rehab Potential Good    PT Frequency 2x / week    PT Duration 6 weeks    PT Treatment/Interventions ADLs/Self Care Home Management;Cryotherapy;Electrical Stimulation;Iontophoresis 56m/ml Dexamethasone;Moist Heat;Traction;Ultrasound;DME Instruction;Gait training;Neuromuscular re-education;Balance training;Functional mobility  training;Therapeutic activities;Therapeutic exercise;Stair training;Patient/family education;Orthotic Fit/Training;Taping;Splinting;Manual techniques;Passive range of motion;Dry needling;Spinal Manipulations;Joint Manipulations    Consulted and Agree with Plan of Care Patient           Patient will benefit from skilled therapeutic intervention in order to improve the following deficits and impairments:  Decreased range of motion, Increased muscle spasms, Decreased endurance, Decreased activity tolerance, Pain, Decreased balance, Impaired flexibility, Improper body mechanics, Decreased strength, Decreased mobility  Visit Diagnosis: Acute left-sided low back pain with left-sided sciatica  Acute pain of left shoulder     Problem List Patient Active Problem List   Diagnosis Date Noted  . Skin nodule 07/04/2020  . Common wart 07/04/2020  . Anemia due to blood loss 02/19/2017  . 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 [place] 03[place]07[place]  . Genital warts 12/26/2013  . Healthcare maintenance 01/16/2013  . Molluscum contagiosum 01/16/2013  . Gynecomastia 10/19/2012  . Left hip pain 07/18/2012  . BIPOLAR DISORDER UNSPECIFIED 01/11/2008  . Attention deficit hyperactivity disorder (ADHD) 01/11/2008    LLyndee Hensen PT, DPT 3:37 PM  08/28/20    [place] [address]Phone: [phone]  Fax:  [phone] Name: Kenneth PECHACEKMRN: [id number] of Birth: 704-05-1991
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
## 2020-08-30 ENCOUNTER — Encounter: Payer: Self-pay | Admitting: Family Medicine

## 2020-08-30 ENCOUNTER — Ambulatory Visit: Payer: BLUE CROSS/BLUE SHIELD | Admitting: Family Medicine

## 2020-08-30 ENCOUNTER — Ambulatory Visit (INDEPENDENT_AMBULATORY_CARE_PROVIDER_SITE_OTHER): Payer: BLUE CROSS/BLUE SHIELD | Admitting: Family Medicine

## 2020-08-30 ENCOUNTER — Other Ambulatory Visit: Payer: Self-pay

## 2020-08-30 VITALS — BP 100/70 | HR 55 | Ht 70.0 in | Wt 207.4 lb

## 2020-08-30 DIAGNOSIS — S060X0A Concussion without loss of consciousness, initial encounter: Secondary | ICD-10-CM | POA: Diagnosis not present

## 2020-08-30 DIAGNOSIS — F902 Attention-deficit hyperactivity disorder, combined type: Secondary | ICD-10-CM | POA: Diagnosis not present

## 2020-08-30 DIAGNOSIS — M25512 Pain in left shoulder: Secondary | ICD-10-CM | POA: Diagnosis not present

## 2020-08-30 MED ORDER — TRAZODONE HCL 50 MG PO TABS: 50.0000 mg | ORAL_TABLET | Freq: Every evening | ORAL | 1 refills | Status: DC | PRN

## 2020-08-30 MED ORDER — AMPHETAMINE-DEXTROAMPHETAMINE 10 MG PO TABS: 10.0000 mg | ORAL_TABLET | Freq: Two times a day (BID) | ORAL | 0 refills | Status: DC
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
## 2020-08-30 NOTE — Patient Instructions (Addendum)
Thank you for coming in today.  Continue home exercises and PT for MSK pain.   For concussion we will treat the most dominant symptoms   Adderall for memory and focus as you have a history of ADHD.  Use trazodone as needed for insomnia.   Reschedule follow up from the 29th to about 3 weeks.   Amphetamine; Dextroamphetamine tablets What is this medicine? AMPHETAMINE; DEXTROAMPHETAMINE(am FET a meen; dex troe am FET a meen) is used to treat attention-deficit hyperactivity disorder (ADHD). It may also be used for narcolepsy. Federal law prohibits giving this medicine to any person other than the person for whom it was prescribed. Do not share this medicine with anyone else. This medicine may be used for other purposes; ask your health care provider or pharmacist if you have questions. COMMON BRAND NAME(S): Adderall What should I tell my health care provider before I take this medicine? They need to know if you have any of these conditions:  anxiety or panic attacks  circulation problems in fingers and toes  glaucoma  hardening or blockages of the arteries or heart blood vessels  heart disease or a heart defect  high blood pressure  history of a drug or alcohol abuse problem  history of stroke  kidney disease  liver disease  mental illness  seizures  suicidal thoughts, plans, or attempt; a previous suicide attempt by you or a family member  thyroid disease  Tourette's syndrome  an unusual or allergic reaction to dextroamphetamine, other amphetamines, other medicines, foods, dyes, or preservatives  pregnant or trying to get pregnant  breast-feeding How should I use this medicine? Take this medicine by mouth with a glass of water. Follow the directions on the prescription label. Take your doses at regular intervals. Do not take your medicine more often than directed. Do not suddenly stop your medicine. You must gradually reduce the dose or you may feel withdrawal  effects. Ask your doctor or health care professional for advice. Talk to your pediatrician regarding the use of this medicine in children. Special care may be needed. While this drug may be prescribed for children as young as 3 years for selected conditions, precautions do apply. Overdosage: If you think you have taken too much of this medicine contact a poison control center or emergency room at once. NOTE: This medicine is only for you. Do not share this medicine with others. What if I miss a dose? If you miss a dose, take it as soon as you can. If it is almost time for your next dose, take only that dose. Do not take double or extra doses. What may interact with this medicine? Do not take this medicine with any of the following medications:  MAOIs like Carbex, Eldepryl, Marplan, Nardil, and Parnate  other stimulant medicines for attention disorders This medicine may also interact with the following medications:  acetazolamide  ammonium chloride  antacids  ascorbic acid  atomoxetine  caffeine  certain medicines for blood pressure  certain medicines for depression, anxiety, or psychotic disturbances  certain medicines for seizures like carbamazepine, phenobarbital, phenytoin  certain medicines for stomach problems like cimetidine, ranitidine, famotidine, esomeprazole, omeprazole, lansoprazole, pantoprazole  lithium  medicines for colds and breathing difficulties  medicines for diabetes  medicines or dietary supplements for weight loss or to stay awake  methenamine  narcotic medicines for pain  quinidine  ritonavir  sodium bicarbonate  St. John's wort This list may not describe all possible interactions. Give your health care provider  a list of all the medicines, herbs, non-prescription drugs, or dietary supplements you use. Also tell them if you smoke, drink alcohol, or use illegal drugs. Some items may interact with your medicine. What should I watch for while  using this medicine? Visit your doctor or health care professional for regular checks on your progress. This prescription requires that you follow special procedures with your doctor and pharmacy. You will need to have a new written prescription from your doctor every time you need a refill. This medicine may affect your concentration, or hide signs of tiredness. Until you know how this medicine affects you, do not drive, ride a bicycle, use machinery, or do anything that needs mental alertness. Tell your doctor or health care professional if this medicine loses its effects, or if you feel you need to take more than the prescribed amount. Do not change the dosage without talking to your doctor or health care professional. Decreased appetite is a common side effect when starting this medicine. Eating small, frequent meals or snacks can help. Talk to your doctor if you continue to have poor eating habits. Height and weight growth of a child taking this medicine will be monitored closely. Do not take this medicine close to bedtime. It may prevent you from sleeping. If you are going to need surgery, a MRI, CT scan, or other procedure, tell your doctor that you are taking this medicine. You may need to stop taking this medicine before the procedure. Tell your doctor or healthcare professional right away if you notice unexplained wounds on your fingers and toes while taking this medicine. You should also tell your healthcare provider if you experience numbness or pain, changes in the skin color, or sensitivity to temperature in your fingers or toes. What side effects may I notice from receiving this medicine? Side effects that you should report to your doctor or health care professional as soon as possible:  allergic reactions like skin rash, itching or hives, swelling of the face, lips, or tongue  anxious  breathing problems  changes in emotions or moods  changes in vision  chest pain or chest  tightness  fast, irregular heartbeat  fingers or toes feel numb, cool, painful  hallucination, loss of contact with reality  high blood pressure  males: prolonged or painful erection  seizures  signs and symptoms of serotonin syndrome like confusion, increased sweating, fever, tremor, stiff muscles, diarrhea  signs and symptoms of a stroke like changes in vision; confusion; trouble speaking or understanding; severe headaches; sudden numbness or weakness of the face, arm or leg; trouble walking; dizziness; loss of balance or coordination  suicidal thoughts or other mood changes  uncontrollable head, mouth, neck, arm, or leg movements Side effects that usually do not require medical attention (report to your doctor or health care professional if they continue or are bothersome):  dry mouth  headache  irritability  loss of appetite  nausea  trouble sleeping  weight loss This list may not describe all possible side effects. Call your doctor for medical advice about side effects. You may report side effects to FDA at 1-800-FDA-1088. Where should I keep my medicine? Keep out of the reach of children. This medicine can be abused. Keep your medicine in a safe place to protect it from theft. Do not share this medicine with anyone. Selling or giving away this medicine is dangerous and against the law. Store at room temperature between 15 and 30 degrees C (59 and 86 degrees F).  Keep container tightly closed. Throw away any unused medicine after the expiration date. Dispose of properly. This medicine may cause accidental overdose and death if it is taken by other adults, children, or pets. Mix any unused medicine with a substance like cat litter or coffee grounds. Then throw the medicine away in a sealed container like a sealed bag or a coffee can with a lid. Do not use the medicine after the expiration date. NOTE: This sheet is a summary. It may not cover all possible information. If you  have questions about this medicine, talk to your doctor, pharmacist, or health care provider.  2020 Elsevier/Gold Standard (2017-01-15 01:60:10)

## 2020-09-04 ENCOUNTER — Ambulatory Visit: Payer: BLUE CROSS/BLUE SHIELD | Admitting: Family Medicine

## 2020-09-09 ENCOUNTER — Encounter: Payer: Self-pay | Admitting: Physical Therapy

## 2020-09-09 ENCOUNTER — Other Ambulatory Visit: Payer: Self-pay

## 2020-09-09 ENCOUNTER — Encounter: Payer: Self-pay | Admitting: Family Medicine

## 2020-09-09 ENCOUNTER — Ambulatory Visit (INDEPENDENT_AMBULATORY_CARE_PROVIDER_SITE_OTHER): Payer: BLUE CROSS/BLUE SHIELD | Admitting: Physical Therapy

## 2020-09-09 DIAGNOSIS — F430 Acute stress reaction: Secondary | ICD-10-CM

## 2020-09-09 DIAGNOSIS — M5442 Lumbago with sciatica, left side: Secondary | ICD-10-CM

## 2020-09-09 DIAGNOSIS — S060X0A Concussion without loss of consciousness, initial encounter: Secondary | ICD-10-CM

## 2020-09-09 DIAGNOSIS — M25512 Pain in left shoulder: Secondary | ICD-10-CM

## 2020-09-09 DIAGNOSIS — F902 Attention-deficit hyperactivity disorder, combined type: Secondary | ICD-10-CM
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
## 2020-09-20 ENCOUNTER — Ambulatory Visit (INDEPENDENT_AMBULATORY_CARE_PROVIDER_SITE_OTHER): Payer: BLUE CROSS/BLUE SHIELD | Admitting: Family Medicine

## 2020-09-20 ENCOUNTER — Encounter: Payer: Self-pay | Admitting: Family Medicine

## 2020-09-20 ENCOUNTER — Other Ambulatory Visit: Payer: Self-pay

## 2020-09-20 VITALS — BP 104/72 | HR 64 | Ht 70.0 in | Wt 209.0 lb

## 2020-09-20 DIAGNOSIS — S060X0D Concussion without loss of consciousness, subsequent encounter: Secondary | ICD-10-CM

## 2020-09-20 MED ORDER — BUPROPION HCL ER (XL) 150 MG PO TB24: 150.0000 mg | ORAL_TABLET | ORAL | 1 refills | Status: DC

## 2020-09-20 MED ORDER — BUPROPION HCL ER (XL) 150 MG PO TB24
150.0000 mg | ORAL_TABLET | ORAL | 1 refills | Status: DC
Start: 2020-09-20 — End: 2021-01-10
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
## 2020-09-20 NOTE — Patient Instructions (Addendum)
Thank you for coming in today.  Please call [phone] to get set up with counseling/therapy.  Stop adderall Start Wellbutrin daily.   L[place] Mushroom extract should be ok.  Consider adding Omega 3 Krill oil.  Recheck in about 1 month especially if not better.

## 2020-09-20 NOTE — Addendum Note (Signed)
Addended by: Gregor Hams on: 09/20/2020 01:21 PM   Modules accepted: Orders

## 2020-09-23 ENCOUNTER — Telehealth: Payer: Self-pay | Admitting: *Deleted

## 2020-09-23 DIAGNOSIS — S060X0D Concussion without loss of consciousness, subsequent encounter: Secondary | ICD-10-CM

## 2020-09-23 DIAGNOSIS — F430 Acute stress reaction: Secondary | ICD-10-CM

## 2020-09-23 NOTE — Telephone Encounter (Signed)
Received a message from [employer] and [employer] does not cover telehealth for [facility]. Please re-enter referral.

## 2020-09-24 NOTE — Telephone Encounter (Signed)
New referral placed today.

## 2020-09-24 NOTE — Telephone Encounter (Signed)
Noted will fax referral

## 2020-10-09 ENCOUNTER — Encounter: Payer: BLUE CROSS/BLUE SHIELD | Admitting: Physician Assistant
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
## 2020-10-21 ENCOUNTER — Ambulatory Visit: Payer: BLUE CROSS/BLUE SHIELD | Admitting: Family Medicine
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
## 2020-10-28 ENCOUNTER — Ambulatory Visit: Payer: BLUE CROSS/BLUE SHIELD | Admitting: Family Medicine
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
## 2021-01-04 ENCOUNTER — Encounter (HOSPITAL_BASED_OUTPATIENT_CLINIC_OR_DEPARTMENT_OTHER): Payer: Self-pay | Admitting: Emergency Medicine

## 2021-01-04 ENCOUNTER — Other Ambulatory Visit: Payer: Self-pay

## 2021-01-04 ENCOUNTER — Emergency Department (HOSPITAL_BASED_OUTPATIENT_CLINIC_OR_DEPARTMENT_OTHER): Payer: 59

## 2021-01-04 ENCOUNTER — Emergency Department (HOSPITAL_BASED_OUTPATIENT_CLINIC_OR_DEPARTMENT_OTHER)
Admission: EM | Admit: 2021-01-04 | Discharge: 2021-01-04 | Disposition: A | Discharge status: Home / Self Care | Payer: 59 | Attending: Emergency Medicine | Admitting: Emergency Medicine

## 2021-01-04 DIAGNOSIS — M25561 Pain in right knee: Secondary | ICD-10-CM | POA: Diagnosis not present

## 2021-01-04 DIAGNOSIS — S161XXA Strain of muscle, fascia and tendon at neck level, initial encounter: Secondary | ICD-10-CM | POA: Diagnosis not present

## 2021-01-04 DIAGNOSIS — M549 Dorsalgia, unspecified: Secondary | ICD-10-CM | POA: Diagnosis not present

## 2021-01-04 DIAGNOSIS — Y92410 Unspecified street and highway as the place of occurrence of the external cause: Secondary | ICD-10-CM | POA: Insufficient documentation

## 2021-01-04 DIAGNOSIS — S199XXA Unspecified injury of neck, initial encounter: Secondary | ICD-10-CM | POA: Diagnosis not present

## 2021-01-04 DIAGNOSIS — Z041 Encounter for examination and observation following transport accident: Secondary | ICD-10-CM | POA: Diagnosis not present

## 2021-01-04 DIAGNOSIS — R519 Headache, unspecified: Secondary | ICD-10-CM | POA: Insufficient documentation

## 2021-01-04 DIAGNOSIS — S8000XA Contusion of unspecified knee, initial encounter: Secondary | ICD-10-CM | POA: Diagnosis not present

## 2021-01-04 DIAGNOSIS — T07XXXA Unspecified multiple injuries, initial encounter: Secondary | ICD-10-CM

## 2021-01-04 MED ORDER — NAPROXEN 250 MG PO TABS
500.0000 mg | ORAL_TABLET | Freq: Once | ORAL | Status: AC
  Administered 2021-01-04: 500 mg via ORAL
  Filled 2021-01-04: qty 2

## 2021-01-04 MED ORDER — OXYCODONE-ACETAMINOPHEN 5-325 MG PO TABS
1.0000 | ORAL_TABLET | Freq: Once | ORAL | Status: AC
  Administered 2021-01-04: 1 via ORAL
  Filled 2021-01-04: qty 1

## 2021-01-04 MED ORDER — METHOCARBAMOL 500 MG PO TABS: 500.0000 mg | ORAL_TABLET | Freq: Two times a day (BID) | ORAL | 0 refills | Status: DC

## 2021-01-04 MED ORDER — ACETAMINOPHEN ER 650 MG PO TBCR: 650.0000 mg | EXTENDED_RELEASE_TABLET | Freq: Three times a day (TID) | ORAL | 0 refills | Status: DC

## 2021-01-04 MED ORDER — METHOCARBAMOL 500 MG PO TABS
500.0000 mg | ORAL_TABLET | Freq: Once | ORAL | Status: AC
  Administered 2021-01-04: 500 mg via ORAL
  Filled 2021-01-04: qty 1
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
## 2021-01-04 NOTE — ED Notes (Signed)
Patient transported to X-ray 
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
## 2021-01-04 NOTE — ED Notes (Signed)
This writer applied a pack of ice onto his right knee to relieve some pain.
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
## 2021-01-06 DIAGNOSIS — R69 Illness, unspecified: Secondary | ICD-10-CM | POA: Diagnosis not present

## 2021-01-10 ENCOUNTER — Other Ambulatory Visit: Payer: Self-pay

## 2021-01-10 ENCOUNTER — Ambulatory Visit (INDEPENDENT_AMBULATORY_CARE_PROVIDER_SITE_OTHER): Payer: Managed Care, Other (non HMO) | Admitting: Physician Assistant

## 2021-01-10 ENCOUNTER — Encounter: Payer: Self-pay | Admitting: Physician Assistant

## 2021-01-10 VITALS — BP 128/80 | HR 54 | Temp 98.1°F | Ht 70.0 in | Wt 203.0 lb

## 2021-01-10 DIAGNOSIS — M546 Pain in thoracic spine: Secondary | ICD-10-CM

## 2021-01-10 DIAGNOSIS — M25561 Pain in right knee: Secondary | ICD-10-CM

## 2021-01-10 MED ORDER — METHOCARBAMOL 500 MG PO TABS
500.0000 mg | ORAL_TABLET | Freq: Two times a day (BID) | ORAL | 0 refills | Status: DC | PRN
Start: 2021-01-10 — End: 2021-10-21
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
## 2021-01-10 NOTE — Patient Instructions (Signed)
It was great to see you!  Let's get you to see Ander Purpura or Antony Haste -- schedule an appointment on your way out.  If you take ibuprofen, also take a nexium (see sample.)  Wrap your knee to see if this helps  Continued ice is ok  Take care,  Inda Coke PA-C

## 2021-01-13 DIAGNOSIS — R69 Illness, unspecified: Secondary | ICD-10-CM | POA: Diagnosis not present

## 2021-01-14 ENCOUNTER — Other Ambulatory Visit: Payer: Self-pay

## 2021-01-14 ENCOUNTER — Ambulatory Visit (INDEPENDENT_AMBULATORY_CARE_PROVIDER_SITE_OTHER): Payer: Managed Care, Other (non HMO) | Admitting: Physical Therapy

## 2021-01-14 DIAGNOSIS — M25561 Pain in right knee: Secondary | ICD-10-CM | POA: Diagnosis not present

## 2021-01-14 DIAGNOSIS — M62838 Other muscle spasm: Secondary | ICD-10-CM | POA: Diagnosis not present

## 2021-01-14 DIAGNOSIS — M545 Low back pain, unspecified: Secondary | ICD-10-CM | POA: Diagnosis not present

## 2021-01-14 DIAGNOSIS — M542 Cervicalgia: Secondary | ICD-10-CM | POA: Diagnosis not present

## 2021-01-19 ENCOUNTER — Encounter: Payer: Self-pay | Admitting: Physical Therapy
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
## 2021-01-20 ENCOUNTER — Encounter: Payer: Managed Care, Other (non HMO) | Admitting: Physical Therapy

## 2021-01-22 ENCOUNTER — Ambulatory Visit (INDEPENDENT_AMBULATORY_CARE_PROVIDER_SITE_OTHER): Payer: Self-pay | Admitting: Physical Therapy

## 2021-01-22 ENCOUNTER — Encounter: Payer: Self-pay | Admitting: Physical Therapy

## 2021-01-22 ENCOUNTER — Other Ambulatory Visit: Payer: Self-pay

## 2021-01-22 ENCOUNTER — Encounter: Payer: Managed Care, Other (non HMO) | Admitting: Physical Therapy

## 2021-01-22 DIAGNOSIS — M545 Low back pain, unspecified: Secondary | ICD-10-CM

## 2021-01-22 DIAGNOSIS — R69 Illness, unspecified: Secondary | ICD-10-CM | POA: Diagnosis not present

## 2021-01-22 DIAGNOSIS — M25561 Pain in right knee: Secondary | ICD-10-CM

## 2021-01-22 DIAGNOSIS — M542 Cervicalgia: Secondary | ICD-10-CM

## 2021-01-22 NOTE — Therapy (Signed)
[facility] [address] [place], [place], [address] Phone: [phone]   Fax:  [phone]  Physical Therapy Treatment  Patient Details  Name: Kenneth Terrell MRN: [id number] Date of Birth: 07/20/1991 Referring Provider (PT): Inda Coke   Encounter Date: 01/22/2021   PT End of Session - 01/22/21 1412    Visit Number 2    Number of Visits 12    Date for PT Re-Evaluation 02/25/21    Authorization Type BCBS    PT Start Time 1325    PT Stop Time 2458    PT Time Calculation (min) 33 min    Activity Tolerance Patient tolerated treatment well    Behavior During Therapy [facility] for tasks assessed/performed           Past Medical History:  Diagnosis Date   ADHD 01/11/2008   4 YOA    BIPOLAR DISORDER UNSPECIFIED 01/11/2008   actually thought more depression - ?misdiagnosis   History of chlamydia 2008   treated   Ulcerative colitis ([place]) 03/2016   by colonoscopy    Past Surgical History:  Procedure Laterality Date   [facility] Admission  1999   x 1 week   COLONOSCOPY  03/2016   Ulcerative colitis by biopsy ([facility])   EXTERNAL EAR SURGERY     KELOID EXCISION  10/2007   left ear (Dr. Towanda Malkin)    There were no vitals filed for this visit.   Subjective Assessment - 01/22/21 1326    Subjective Pt states his neck felt better after his last session and DN. Pt states he is trying to do more activity but is limited by the neck pain and fear of overdoing it. Pt states that he would like to focus on the neck at today's session.Pt states he had a shooting pain down his back earlier today when getting out of the truck. Pt states poor compliance with HEP.    Limitations Lifting;Standing;Walking;House hold activities    Patient Stated Goals decreased pain    Currently in Pain? Yes    Pain Score 7     Pain Location Neck    Pain Orientation Right;Left    Pain Descriptors / Indicators Aching;Sore;Spasm    Pain Type Acute pain    Pain  Onset 1 to 4 weeks ago    Pain Frequency Intermittent    Multiple Pain Sites Yes    Pain Score 3    Pain Location Back    Pain Orientation Right;Left    Pain Descriptors / Indicators Tightness    Pain Onset More than a month ago    Pain Frequency Intermittent    Pain Score 6    Pain Location Knee    Pain Orientation Right    Pain Descriptors / Indicators Aching    Pain Type Acute pain    Pain Onset 1 to 4 weeks ago                             [facility] Adult PT Treatment/Exercise - 01/22/21 1323      Exercises   Exercises Neck;Knee/Hip      Neck Exercises: Standing   Neck Retraction 10 reps;3 secs    Other Standing Exercises Row with chin tuck GTB 10x      Neck Exercises: Seated   Shoulder Rolls Backwards;Forwards;10 reps      Manual Therapy   Manual Therapy Joint mobilization;Soft tissue mobilization    Joint Mobilization grade  II C2-7 PA, LAD subocciput hold grade II-III    Soft tissue mobilization L UT, C/S paraspinals, suboccipitals      Neck Exercises: Stretches   Upper Trapezius Stretch 30 seconds;2 reps    Levator Stretch 2 reps;30 seconds    Corner Stretch 30 seconds;2 reps                  PT Education - 01/22/21 1412    Education Details Anatomy, pain limiting, acceptable lever of pain with exercise, importance of HEP    Person(s) Educated Patient    Methods Explanation;Demonstration;Handout    Comprehension Verbalized understanding;Returned demonstration            PT Short Term Goals - 01/19/21 1739      PT SHORT TERM GOAL #1   Title Pt to be independent with initial HEP    Time 2    Period Weeks    Status New    Target Date 01/28/21             PT Long Term Goals - 01/19/21 1740      PT LONG TERM GOAL #1   Title Pt to be independent with final HEP     Time 6    Period Weeks    Status New    Target Date 02/25/21      PT LONG TERM GOAL #2   Title Pt to demo decreased pain in neck and R knee to 0-2/10 with  activity    Time 6    Period Weeks    Status Partially Met    Target Date 02/25/21      PT LONG TERM GOAL #3   Title Pt to demo soft tissue restrictions in cervical and upper trap region to be WNL to decrease pain and improve ROM.    Time 6    Period Weeks    Status Achieved    Target Date 02/25/21      PT LONG TERM GOAL #4   Title Pt to demo ability for squat and stairs, with R knee pain 0-1/10, to improve abilityfor IADLs.    Time 6    Period Weeks    Status Achieved    Target Date 02/25/21                 Plan - 01/22/21 1414    Clinical Impression Statement Pt presents with increased L UT and C/S paraspinal spasm at today's session that responded well to [facility] and grade II joint mobs, with report of decrease pain after. Pt with significant hypertonicity into the mid to upper thoracic region as well. Pt was able to progress to rowing with GTB without increasing pain. Due to shortened visit length, pt stated he wanted to focus on the neck at today's session. 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    Examination-Activity Limitations Locomotion Level;Stairs;Stand;Lift;Bend;Carry;Squat    Examination-Participation Restrictions Cleaning;Occupation;Community Activity;Driving;Yard Work    Stability/Clinical Decision Making Stable/Uncomplicated    Clinical Decision Making Low    Rehab Potential Good    PT Frequency 2x / week    PT Duration 6 weeks    PT Treatment/Interventions ADLs/Self Care Home Management;Cryotherapy;Electrical Stimulation;Iontophoresis 46m/ml Dexamethasone;Moist Heat;Traction;Ultrasound;DME Instruction;Gait training;Neuromuscular re-education;Balance training;Functional mobility training;Therapeutic activities;Therapeutic exercise;Stair training;Patient/family education;Orthotic Fit/Training;Taping;Splinting;Manual techniques;Passive range of  motion;Dry needling;Spinal Manipulations;Joint Manipulations;Vasopneumatic Device    Consulted and Agree with Plan of Care  Patient           Patient will benefit from skilled therapeutic intervention in order to improve the following deficits and impairments:  Decreased range of motion,Increased muscle spasms,Decreased activity tolerance,Pain,Decreased balance,Impaired flexibility,Improper body mechanics,Decreased strength,Decreased mobility  Visit Diagnosis: Cervicalgia  Acute pain of right knee  Acute bilateral low back pain without sciatica     Problem List Patient Active Problem List   Diagnosis Date Noted   Concussion with no loss of consciousness 08/30/2020   Skin nodule 07/04/2020   Common wart 07/04/2020   Anemia due to blood loss 02/19/2017   Chronic idiopathic gout involving toe of left foot without tophus 01/21/2017   Ulcerative colitis ([place]) 03/07/2016   Genital warts 12/26/2013   Healthcare maintenance 01/16/2013   Molluscum contagiosum 01/16/2013   Gynecomastia 10/19/2012   Left hip pain 07/18/2012   BIPOLAR DISORDER UNSPECIFIED 01/11/2008   Attention deficit hyperactivity disorder (ADHD) 01/11/2008   Daleen Bo PT, DPT 01/22/21 2:19 PM   [facility] [place], [place], [address] Phone: [phone]   Fax:  [phone]  Name: Kenneth Terrell MRN: [id number] Date of Birth: July 06, 1991

## 2021-01-22 NOTE — Patient Instructions (Signed)
[id number]

## 2021-01-29 ENCOUNTER — Other Ambulatory Visit: Payer: Self-pay

## 2021-01-29 ENCOUNTER — Encounter: Payer: Self-pay | Admitting: Physical Therapy

## 2021-01-29 ENCOUNTER — Ambulatory Visit (INDEPENDENT_AMBULATORY_CARE_PROVIDER_SITE_OTHER): Payer: Self-pay | Admitting: Physical Therapy

## 2021-01-29 DIAGNOSIS — M25561 Pain in right knee: Secondary | ICD-10-CM

## 2021-01-29 DIAGNOSIS — M542 Cervicalgia: Secondary | ICD-10-CM

## 2021-01-29 DIAGNOSIS — M545 Low back pain, unspecified: Secondary | ICD-10-CM

## 2021-01-29 DIAGNOSIS — M62838 Other muscle spasm: Secondary | ICD-10-CM

## 2021-01-29 NOTE — Therapy (Addendum)
[facility] [address] [place], [place], [address] Phone: [phone]   Fax:  [phone]  Physical Therapy Treatment  Patient Details  Name: Kenneth Terrell MRN: [id number] Date of Birth: 12/29/1990 Referring Provider (PT): Inda Coke   Encounter Date: 01/29/2021   PT End of Session - 01/29/21 1427    Visit Number 3    Number of Visits 12    Date for PT Re-Evaluation 02/25/21    Authorization Type BCBS    PT Start Time 1304    PT Stop Time 1352    PT Time Calculation (min) 48 min    Activity Tolerance Patient tolerated treatment well    Behavior During Therapy [facility] for tasks assessed/performed           Past Medical History:  Diagnosis Date  . ADHD 01/11/2008   4 YOA   . BIPOLAR DISORDER UNSPECIFIED 01/11/2008   actually thought more depression - ?misdiagnosis  . History of chlamydia 2008   treated  . Ulcerative colitis ([place]) 03/2016   by colonoscopy    Past Surgical History:  Procedure Laterality Date  . [facility] Admission  1999   x 1 week  . COLONOSCOPY  03/2016   Ulcerative colitis by biopsy ([facility])  . EXTERNAL EAR SURGERY    . KELOID EXCISION  10/2007   left ear (Dr. Towanda Malkin)    There were no vitals filed for this visit.   Subjective Assessment - 01/29/21 1308    Subjective Pt states doing better, but still having pain in neck, mostly on L side. R knee also still sore. Has been seeing chiropractor as well as massage therapy. Felt dry needling was beneficial.    Currently in Pain? Yes    Pain Score 6     Pain Location Neck    Pain Orientation Left    Pain Descriptors / Indicators Aching    Pain Type Acute pain    Pain Onset More than a month ago    Pain Frequency Intermittent    Pain Score 3    Pain Location Knee    Pain Orientation Right    Pain Descriptors / Indicators Aching    Pain Type Acute pain    Pain Onset More than a month ago    Pain Frequency Intermittent                              [facility] Adult PT Treatment/Exercise - 01/29/21 0001      Lumbar Exercises: Stretches   Other Lumbar Stretch Exercise Prone quad stretch with strap, reviewed for HEP.      Manual Therapy   Manual Therapy Passive ROM    Joint Mobilization grade II C2-7 PA, LAD    Soft tissue mobilization DTM: L UT, C/S paraspinals, suboccipitals,  DTM and IASTM to distal and lasteral quad and distal ITB;    Passive ROM Manual stretching for quad /prone, and UT in supine.            Trigger Point Dry Needling - 01/29/21 0001    Consent Given? Yes    Education Handout Provided Previously provided    Muscles Treated Head and Neck Upper trapezius;Levator scapulae    Upper Trapezius Response Palpable increased muscle length;Twitch reponse elicited   L   Levator Scapulae Response Palpable increased muscle length   L  PT Short Term Goals - 01/19/21 1739      PT SHORT TERM GOAL #1   Title Pt to be independent with initial HEP    Time 2    Period Weeks    Status New    Target Date 01/28/21             PT Long Term Goals - 01/19/21 1740      PT LONG TERM GOAL #1   Title Pt to be independent with final HEP     Time 6    Period Weeks    Status New    Target Date 02/25/21      PT LONG TERM GOAL #2   Title Pt to demo decreased pain in neck and R knee to 0-2/10 with activity    Time 6    Period Weeks    Status Partially Met    Target Date 02/25/21      PT LONG TERM GOAL #3   Title Pt to demo soft tissue restrictions in cervical and upper trap region to be WNL to decrease pain and improve ROM.    Time 6    Period Weeks    Status Achieved    Target Date 02/25/21      PT LONG TERM GOAL #4   Title Pt to demo ability for squat and stairs, with R knee pain 0-1/10, to improve abilityfor IADLs.    Time 6    Period Weeks    Status Achieved    Target Date 02/25/21                 Plan - 01/29/21 1428    Clinical Impression  Statement Focus on manual therapy today for decreasing distal quad and ITB tightness for knee pain, as well as for muscle tension and neck pain. Noted hypertonicity in L UT, addressed with DN today, as pt did have + response last visit. Pt with most pain at L UT, also sore at L mid cervical region. Plan to progress as tolerated.    Examination-Activity Limitations Locomotion Level;Stairs;Stand;Lift;Bend;Carry;Squat    Examination-Participation Restrictions Cleaning;Occupation;Community Activity;Driving;Yard Work    Stability/Clinical Decision Making Stable/Uncomplicated    Rehab Potential Good    PT Frequency 2x / week    PT Duration 6 weeks    PT Treatment/Interventions ADLs/Self Care Home Management;Cryotherapy;Electrical Stimulation;Iontophoresis 26m/ml Dexamethasone;Moist Heat;Traction;Ultrasound;DME Instruction;Gait training;Neuromuscular re-education;Balance training;Functional mobility training;Therapeutic activities;Therapeutic exercise;Stair training;Patient/family education;Orthotic Fit/Training;Taping;Splinting;Manual techniques;Passive range of motion;Dry needling;Spinal Manipulations;Joint Manipulations;Vasopneumatic Device    Consulted and Agree with Plan of Care Patient           Patient will benefit from skilled therapeutic intervention in order to improve the following deficits and impairments:  Decreased range of motion,Increased muscle spasms,Decreased activity tolerance,Pain,Decreased balance,Impaired flexibility,Improper body mechanics,Decreased strength,Decreased mobility  Visit Diagnosis: Cervicalgia  Acute pain of right knee  Acute bilateral low back pain without sciatica  Other muscle spasm     Problem List Patient Active Problem List   Diagnosis Date Noted  . Concussion with no loss of consciousness 08/30/2020  . Skin nodule 07/04/2020  . Common wart 07/04/2020  . Anemia due to blood loss 02/19/2017  . Chronic idiopathic gout involving toe of left foot  without tophus 01/21/2017  . [place] ([place] 03/07/2016  . Genital warts 12/26/2013  . Healthcare maintenance 01/16/2013  . Molluscum contagiosum 01/16/2013  . Gynecomastia 10/19/2012  . Left hip pain 07/18/2012  . BIPOLAR DISORDER UNSPECIFIED 01/11/2008  . Attention deficit hyperactivity disorder (ADHD)  01/11/2008    Lyndee Hensen, PT, DPT 2:34 PM  01/29/21    [facility] [place], [place], [address] Phone: [phone]   Fax:  [phone]  Name: Kenneth Terrell MRN: [id number] Date of Birth: 12-Oct-1991   PHYSICAL THERAPY DISCHARGE SUMMARY  Visits from Start of Care: 3 Plan: Patient agrees to discharge.  Patient goals were partially met. Patient is being discharged due to not returning since the last visit.  ?????    Lyndee Hensen, PT, DPT 11:26 AM  04/01/21

## 2021-01-30 DIAGNOSIS — R69 Illness, unspecified: Secondary | ICD-10-CM | POA: Diagnosis not present

## 2021-01-31 ENCOUNTER — Telehealth: Payer: Self-pay

## 2021-01-31 MED ORDER — PREDNISONE 20 MG PO TABS
20.0000 mg | ORAL_TABLET | Freq: Two times a day (BID) | ORAL | 0 refills | Status: DC
Start: 2021-01-31 — End: 2021-10-07

## 2021-01-31 NOTE — Telephone Encounter (Signed)
Spoke to pt told him going to send in Prednisone 20 mg BID x 5 days per Aldona Bar. If symptoms do not improve please schedule an appt. Pt verbalized understanding. Rx sent.

## 2021-01-31 NOTE — Addendum Note (Signed)
Addended by: Marian Sorrow on: 01/31/2021 01:57 PM   Modules accepted: Orders

## 2021-01-31 NOTE — Telephone Encounter (Signed)
Patient called in stating that he has some inflammation, and is wanting a 5 day prescription sent in to help. Also requesting a returned phone call from [place].

## 2021-01-31 NOTE — Telephone Encounter (Signed)
Ok to send in 20 mg prednisone BID x 5 days.

## 2021-01-31 NOTE — Telephone Encounter (Signed)
Please see message and advise 

## 2021-02-03 ENCOUNTER — Encounter: Payer: Managed Care, Other (non HMO) | Admitting: Physical Therapy

## 2021-02-04 ENCOUNTER — Encounter: Payer: Self-pay | Admitting: Physician Assistant

## 2021-02-04 ENCOUNTER — Telehealth: Payer: Self-pay

## 2021-02-04 ENCOUNTER — Encounter: Payer: Managed Care, Other (non HMO) | Admitting: Physical Therapy

## 2021-02-04 NOTE — Telephone Encounter (Signed)
Sent via mychart
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
## 2021-02-04 NOTE — Telephone Encounter (Signed)
Please see message. °

## 2021-02-04 NOTE — Telephone Encounter (Signed)
Requested for the note to be release through Mychart account if possible.

## 2021-02-05 NOTE — Telephone Encounter (Signed)
Spoke to pt told him Aldona Bar did note for work and is in his My Chart. Pt verbalized understanding and said thank you.

## 2021-02-10 DIAGNOSIS — R69 Illness, unspecified: Secondary | ICD-10-CM | POA: Diagnosis not present

## 2021-02-18 DIAGNOSIS — R69 Illness, unspecified: Secondary | ICD-10-CM | POA: Diagnosis not present

## 2021-02-27 DIAGNOSIS — R69 Illness, unspecified: Secondary | ICD-10-CM | POA: Diagnosis not present

## 2021-10-07 ENCOUNTER — Other Ambulatory Visit (HOSPITAL_COMMUNITY)
Admission: RE | Admit: 2021-10-07 | Discharge: 2021-10-07 | Disposition: A | Discharge status: Home / Self Care | Payer: Self-pay | Source: Ambulatory Visit | Attending: Family | Admitting: Family

## 2021-10-07 ENCOUNTER — Encounter: Payer: Self-pay | Admitting: Family

## 2021-10-07 ENCOUNTER — Telehealth: Payer: Self-pay

## 2021-10-07 ENCOUNTER — Ambulatory Visit (INDEPENDENT_AMBULATORY_CARE_PROVIDER_SITE_OTHER): Payer: Self-pay | Admitting: Family

## 2021-10-07 ENCOUNTER — Other Ambulatory Visit: Payer: Self-pay

## 2021-10-07 VITALS — BP 118/78 | HR 54 | Temp 98.2°F | Ht 70.0 in | Wt 203.2 lb

## 2021-10-07 DIAGNOSIS — L293 Anogenital pruritus, unspecified: Secondary | ICD-10-CM | POA: Insufficient documentation

## 2021-10-07 DIAGNOSIS — K51311 Ulcerative (chronic) rectosigmoiditis with rectal bleeding: Secondary | ICD-10-CM

## 2021-10-07 DIAGNOSIS — A749 Chlamydial infection, unspecified: Secondary | ICD-10-CM

## 2021-10-07 MED ORDER — PREDNISONE 20 MG PO TABS: ORAL_TABLET | ORAL | 0 refills | Status: DC

## 2021-10-07 NOTE — Telephone Encounter (Signed)
Nurse Assessment Nurse: Nyoka Cowden, RN, [place] Date/Time (Eastern Time): 10/06/2021 11:22:46 AM Confirm and document reason for call. If symptomatic, describe symptoms. ---Caller states he has inflammation in his bowels and needs a script for prednisone and also has itching pain when he urinates. Two weeks since the urination symptoms started, concerned for an STD. Does the patient have any new or worsening symptoms? ---Yes Will a triage be completed? ---Yes Related visit to physician within the last 2 weeks? ---No Does the PT have any chronic conditions? (i.e. diabetes, asthma, this includes High risk factors for pregnancy, etc.) ---Yes List chronic conditions. ---colitis Is this a behavioral health or substance abuse call? ---No Guidelines Guideline Title Affirmed Question Affirmed Notes Nurse Date/Time (Eastern Time) Rectal Bleeding MODERATE rectal bleeding (small blood clots, passing blood without stool, or toilet water turns red) Nyoka Cowden, RN, [place] Hawks 10/06/2021 11:26:41 AM Urination Pain - Male All other males with painful urination Nyoka Cowden, RN, [place] 10/06/2021 11:31:46 AM PLEASE NOTE: All timestamps contained within this report are represented as [place] Standard Time. CONFIDENTIALTY NOTICE: This fax transmission is intended only for the addressee. It contains information that is legally privileged, confidential or otherwise protected from use or disclosure. If you are not the intended recipient, you are strictly prohibited from reviewing, disclosing, copying using or disseminating any of this information or taking any action in reliance on or regarding this information. If you have received this fax in error, please notify us immediately by telephone so that we can arrange for its return to [place]. Phone: [phone], Toll-Free: [phone], Fax: [phone] Page: 2 of 2 Call Id: [id number] Noank. Time Eilene Ghazi Time) Disposition Final User 10/06/2021 11:31:27 AM See PCP  within 24 Hours Green, RN, [place] 10/06/2021 11:33:17 AM See PCP within 24 Hours Yes Nyoka Cowden, RN, Beatrix Shipper Disagree/Comply Comply Caller Understands Yes PreDisposition Call Doctor Care Advice Given Per Guideline SEE PCP WITHIN 24 HOURS: CALL BACK IF: * Dizziness occurs * Bleeding increases * You become worse CARE ADVICE given per Rectal Bleeding (Adult) guideline. SEE PCP WITHIN 24 HOURS: CALL BACK IF: * Fever over 100.4 F (38.0 C) occurs * Side (flank) or lower back pain occurs * You become worse CARE ADVICE given per Urination Pain - Male (Adult) guideline. Referrals REFERRED TO PCP OFFIC

## 2021-10-07 NOTE — Progress Notes (Signed)
Subjective:     Patient ID: Kenneth Terrell, male    DOB: December 29, 1990, 30 y.o.   MRN: [id number]  Chief Complaint  Patient presents with   Rectal Bleeding    Started 2 months ago.   Exposure to STD    Pt says that after have sexual intercourse, 2 days later urinary itching started. He states the male showed him negative results. He still would like to be tested for all STD's.     GI Bleeding: Patient complains of bright red blood per rectum. Symptoms have been present for approximately 2 months. The symptoms are unchanged. This has been associated with no other symptoms.  He has not used nonsteroidal anti-inflammatory drugs on a regular bases; he is not anticoagulated.  The patient currently denies significant abdominal pain or discomfort.  There is a past history of gastrointestinal bleeding: Ulcerative colitis.   Sexually Transmitted Disease Check: Patient presents for sexually transmitted disease check. Sexual history reviewed with the patient. STD exposure: current sexual partner thought to have no history of any STD, but had unprotected intercourse.  Previous history of STD:  genital warts. Current symptoms include urethral discharge: white and thick and penile itching.  Contraception: condoms.    Health Maintenance Due  Topic Date Due   Hepatitis C Screening  Never done   COVID-19 Vaccine (2 - Pfizer series) 07/29/2020    Past Medical History:  Diagnosis Date   ADHD 01/11/2008   3 YOA    BIPOLAR DISORDER UNSPECIFIED 01/11/2008   actually thought more depression - ?misdiagnosis   History of chlamydia 2008   treated   Ulcerative colitis ([place]) 03/2016   by colonoscopy    Past Surgical History:  Procedure Laterality Date   Charter Admission  1999   x 1 week   COLONOSCOPY  03/2016   Ulcerative colitis by biopsy ([facility])   EXTERNAL EAR SURGERY     KELOID EXCISION  10/2007   left ear (Dr. Towanda Malkin)    Outpatient Medications Prior to Visit  Medication Sig Dispense Refill    methocarbamol (ROBAXIN) 500 MG tablet Take 1 tablet (500 mg total) by mouth 2 (two) times daily as needed for muscle spasms. (Patient not taking: Reported on 10/07/2021) 30 tablet 0   predniSONE (DELTASONE) 20 MG tablet Take 1 tablet (20 mg total) by mouth 2 (two) times daily with a meal. (Patient not taking: Reported on 10/07/2021) 10 tablet 0   No facility-administered medications prior to visit.    No Known Allergies      Objective:    Physical Exam Vitals and nursing note reviewed.  Constitutional:      General: He is not in acute distress.    Appearance: Normal appearance.  HENT:     Head: Normocephalic.  Cardiovascular:     Rate and Rhythm: Normal rate and regular rhythm.  Pulmonary:     Effort: Pulmonary effort is normal.     Breath sounds: Normal breath sounds.  Genitourinary:    Comments: Did not exam today. Musculoskeletal:        General: Normal range of motion.     Cervical back: Normal range of motion.  Skin:    General: Skin is warm and dry.  Neurological:     Mental Status: He is alert and oriented to person, place, and time.  Psychiatric:        Mood and Affect: Mood normal.    BP 118/78   Pulse (!) 54   Temp 98.2 F (  36.8 C) (Temporal)   Ht 5' 10"  (1.778 m)   Wt 203 lb 3.2 oz (92.2 kg)   SpO2 98%   BMI 29.16 kg/m  Wt Readings from Last 3 Encounters:  10/07/21 203 lb 3.2 oz (92.2 kg)  01/10/21 203 lb (92.1 kg)  01/04/21 200 lb (90.7 kg)       Assessment & Plan:   Problem List Items Addressed This Visit       Digestive   Ulcerative colitis ([place]) - Primary    Having another flare, reports taking prednisone for 5 days back in Feb but does not feel he recovered as quickly, denies any SE other than slight hunger, requesting more days of medication. Sending prednisone with slight taper for 10d. Advised pt f/u with GI.      Relevant Medications   predniSONE (DELTASONE) 20 MG tablet     Musculoskeletal and Integument   Itching of penis     Noticed 2 days after intercourse with male, also small amount of thick white discharge, denies erythema, dysuria, pain, or swelling. Advised could be yeast and can try OTC antifungal cream. Will check urine for STDs.      Relevant Orders   Urine cytology ancillary only    Meds ordered this encounter  Medications   predniSONE (DELTASONE) 20 MG tablet    Sig: Take 2 pills for 8 days, then 1 pill for 2 days.    Dispense:  18 tablet    Refill:  0    Order Specific Question:   Supervising Provider    Answer:   ANDY, CAMILLE L [[id number]]   Time spent with patient today was 30 minutes which consisted of chart review, discussing diagnoses, work up, treatment answering questions and documentation.

## 2021-10-07 NOTE — Patient Instructions (Addendum)
It was very nice to see you today!  As discussed, I have sent prednisone to your pharmacy. Please follow up with GI re: your UC. Please go to the lab to leave a urine specimen for testing. I will let you know via MyChart the results in 2-3 days. Pick up OTC antifungal cream (e.g. Lotrimin) small amount on outside & inside twice a day to help with symptoms.   PLEASE NOTE:  If you had any lab tests please let us know if you have not heard back within a few days. You may see your results on mychart before we have a chance to review them but we will give you a call once they are reviewed by [place]. If we ordered any referrals today, please let us know if you have not heard from their office within the next week.   Please try these tips to maintain a healthy lifestyle:  Eat most of your calories during the day when you are active. Eliminate processed foods including packaged sweets (pies, cakes, cookies), reduce intake of potatoes, white bread, white pasta, and white rice. Look for whole grain options, oat flour or almond flour.  Each meal should contain half fruits/vegetables, one quarter protein, and one quarter carbs (no bigger than a computer mouse).  Cut down on sweet beverages. This includes juice, soda, and sweet tea. Also watch fruit intake, though this is a healthier sweet option, it still contains natural sugar! Limit to 3 servings daily.  Drink at least 1 glass of water with each meal and aim for at least 8 glasses per day  Exercise at least 150 minutes every week.

## 2021-10-07 NOTE — Progress Notes (Signed)
Subjective:     Patient ID: Kenneth Terrell, male    DOB: 1991/12/07, 30 y.o.   MRN: [id number]  Chief Complaint  Patient presents with   Rectal Bleeding    Started 2 months ago.   Exposure to STD    Pt says that after have sexual intercourse, 2 days later urinary itching started. He states the male showed him negative results. He still would like to be tested for all STD's.     Rectal Bleeding   Exposure to STD   Health Maintenance Due  Topic Date Due   Hepatitis C Screening  Never done   COVID-19 Vaccine (2 - Pfizer series) 07/29/2020    Past Medical History:  Diagnosis Date   ADHD 01/11/2008   3 YOA    BIPOLAR DISORDER UNSPECIFIED 01/11/2008   actually thought more depression - ?misdiagnosis   History of chlamydia 2008   treated   Ulcerative colitis ([place]) 03/2016   by colonoscopy    Past Surgical History:  Procedure Laterality Date   Charter Admission  1999   x 1 week   COLONOSCOPY  03/2016   Ulcerative colitis by biopsy ([facility])   EXTERNAL EAR SURGERY     KELOID EXCISION  10/2007   left ear (Dr. Towanda Malkin)    Outpatient Medications Prior to Visit  Medication Sig Dispense Refill   methocarbamol (ROBAXIN) 500 MG tablet Take 1 tablet (500 mg total) by mouth 2 (two) times daily as needed for muscle spasms. (Patient not taking: Reported on 10/07/2021) 30 tablet 0   predniSONE (DELTASONE) 20 MG tablet Take 1 tablet (20 mg total) by mouth 2 (two) times daily with a meal. (Patient not taking: Reported on 10/07/2021) 10 tablet 0   No facility-administered medications prior to visit.    No Known Allergies      Objective:    Physical Exam Vitals and nursing note reviewed.  Constitutional:      General: He is not in acute distress.    Appearance: Normal appearance.  HENT:     Head: Normocephalic.  Cardiovascular:     Rate and Rhythm: Normal rate and regular rhythm.  Pulmonary:     Effort: Pulmonary effort is normal.     Breath sounds: Normal breath sounds.   Musculoskeletal:        General: Normal range of motion.     Cervical back: Normal range of motion.  Skin:    General: Skin is warm and dry.  Neurological:     Mental Status: He is alert and oriented to person, place, and time.  Psychiatric:        Mood and Affect: Mood normal.    BP 118/78   Pulse (!) 54   Temp 98.2 F (36.8 C) (Temporal)   Ht 5' 10"  (1.778 m)   Wt 203 lb 3.2 oz (92.2 kg)   SpO2 98%   BMI 29.16 kg/m  Wt Readings from Last 3 Encounters:  10/07/21 203 lb 3.2 oz (92.2 kg)  01/10/21 203 lb (92.1 kg)  01/04/21 200 lb (90.7 kg)       Assessment & Plan:   Problem List Items Addressed This Visit       Digestive   Ulcerative colitis ([place]) - Primary    Having another flare, reports taking prednisone for 5 days back in Feb but does not feel he recovered as quickly, denies any SE other than slight hunger, requesting more days of medication. Sending prednisone with slight taper for 10d.  Advised pt f/u with GI.      Relevant Medications   predniSONE (DELTASONE) 20 MG tablet     Musculoskeletal and Integument   Itching of penis    Noticed 2 days after intercourse with male, also small amount of thick white discharge, denies erythema, dysuria, pain, or swelling. Advised could be yeast and can try OTC antifungal cream. Will check urine for STDs.      Relevant Orders   Urine cytology ancillary only    Meds ordered this encounter  Medications   predniSONE (DELTASONE) 20 MG tablet    Sig: Take 2 pills for 8 days, then 1 pill for 2 days.    Dispense:  18 tablet    Refill:  0    Order Specific Question:   Supervising Provider    Answer:   ANDY, CAMILLE L [[id number]]

## 2021-10-07 NOTE — Telephone Encounter (Signed)
Patient has an appointment today

## 2021-10-07 NOTE — Telephone Encounter (Signed)
Noted  

## 2021-10-07 NOTE — Assessment & Plan Note (Signed)
Having another flare, reports taking prednisone for 5 days back in Feb but does not feel he recovered as quickly, denies any SE other than slight hunger, requesting more days of medication. Sending prednisone with slight taper for 10d. Advised pt f/u with GI.

## 2021-10-07 NOTE — Assessment & Plan Note (Signed)
Noticed 2 days after intercourse with male, also small amount of thick white discharge, denies erythema, dysuria, pain, or swelling. Advised could be yeast and can try OTC antifungal cream. Will check urine for STDs.

## 2021-10-08 DIAGNOSIS — A749 Chlamydial infection, unspecified: Secondary | ICD-10-CM | POA: Insufficient documentation

## 2021-10-08 LAB — URINE CYTOLOGY ANCILLARY ONLY
Chlamydia: POSITIVE — AB
Comment: NEGATIVE
Comment: NEGATIVE
Comment: NORMAL
Neisseria Gonorrhea: NEGATIVE
Trichomonas: NEGATIVE

## 2021-10-08 MED ORDER — DOXYCYCLINE HYCLATE 100 MG PO TABS: 100.0000 mg | ORAL_TABLET | Freq: Two times a day (BID) | ORAL | 0 refills | Status: DC

## 2021-10-08 NOTE — Addendum Note (Signed)
Addended byJeanie Sewer on: 10/08/2021 05:00 PM   Modules accepted: Orders

## 2021-10-16 ENCOUNTER — Other Ambulatory Visit: Payer: Self-pay | Admitting: Family

## 2021-10-16 DIAGNOSIS — K51311 Ulcerative (chronic) rectosigmoiditis with rectal bleeding: Secondary | ICD-10-CM

## 2021-10-16 NOTE — Telephone Encounter (Signed)
Please advise for refill?  

## 2021-10-16 NOTE — Telephone Encounter (Signed)
Patient called in stating he is down to his last two tablets for today.  States he was seen by Jeanie Sewer on 11/1.   Patient states he does still have inflammation and bleeding but this has gotten better with the prednisone.    States he is afraid symptoms will get worse after today.  Please follow up with patient in regard at [phone].

## 2021-10-20 NOTE — Telephone Encounter (Signed)
Spoke to pt told him calling about message last week. Aldona Bar wanted to know if you have an upcoming appt with GI? Pt said they can not see him till Jan. Asked pt if he is any worse since he saw Colletta [place]? Pt said he is a little better, yesterday had blood in stool and little today. He said the prednisone has helped. Asked pt if he finished prednisone? Pt said yes. Put pt on hold. Told pt discussed with Aldona Bar and she would like to see you today or tomorrow. Pt said tomorrow would be better. Appt scheduled for tomorrow at 11:30 AM. Pt verbliazed understanding.

## 2021-10-20 NOTE — Progress Notes (Signed)
Kenneth Terrell is a 30 y.o. male here for rectal bleeding.  History of Present Illness:   Chief Complaint  Patient presents with   Rectal Bleeding    Pt is still having rectal bleeding, small amount of blood today.   Cyst    Pt c/o knot right forehead x 2 yrs. Pt said it has grown and has been having headaches in that area.    HPI  Rectal Bleeding  Crosley states he has been experiencing rectal bleeding for two months. States he is also experiencing hard stools but says bleeding has not changed in terms of volume or intensity. Darren was previously seen by Jeanie Sewer, NP, about this issue on 10/07/21. During that visit he reported he had a hx of gastrointestinal bleeding per dx of ulcerative colitis. After receiving this information, he was believed to be having a flare up and prescribed prednisone 20 mg with slight taper.   Currently Maisen reports he was compliant with the prednisone 20 mg taper, but was provided with relief. Following the completion of the medication, he began to have sx again. Denies abdominal pain or blood clots following BMs. He is not having lightheadedness of dizziness.  Cyst Mr. Mallozzi also noticed a cyst on the right side of his forehead that has been present for two years.  He has seen Dr. Leo Grosser for this issue on 07/01/20 and was told it wasn't anything to be concerned about at that time due to smaller size. Following this visit, Hikaru was no longer concerned until he started having headaches and noticed it growing in size. He reports not taking anything to manage his HA pain. Denies MVA, recent injury, drainage, or pain upon palpation.   Past Medical History:  Diagnosis Date   ADHD 01/11/2008   3 YOA    BIPOLAR DISORDER UNSPECIFIED 01/11/2008   actually thought more depression - ?misdiagnosis   History of chlamydia 2008   treated   Ulcerative colitis ([place]) 03/2016   by colonoscopy     Social History   Tobacco Use   Smoking status: Never    Smokeless tobacco: Never  Substance Use Topics   Alcohol use: No    Alcohol/week: 0.0 standard drinks   Drug use: No    Past Surgical History:  Procedure Laterality Date   Charter Admission  1999   x 1 week   COLONOSCOPY  03/2016   Ulcerative colitis by biopsy ([facility])   EXTERNAL EAR SURGERY     KELOID EXCISION  10/2007   left ear (Dr. Towanda Malkin)    Family History  Problem Relation Age of Onset   Drug abuse Father    CAD Other        MI; angina   Cancer Other        colon   Diabetes Maternal Uncle    Cancer Maternal Grandmother        lung cancer met. to brain   Depression Maternal Grandmother    Stroke Maternal Grandmother    Colon cancer Maternal Grandmother     No Known Allergies  Current Medications:  No current outpatient medications on file.   Review of Systems:   ROS Negative unless otherwise specified per HPI. Vitals:   Vitals:   10/21/21 1133  BP: 100/68  Pulse: 66  Temp: 98.4 F (36.9 C)  TempSrc: Temporal  SpO2: 97%  Weight: 204 lb 6.1 oz (92.7 kg)  Height: 5' 10"  (1.778 m)     Body mass index is 29.33  kg/m.  Physical Exam:   Physical Exam Vitals and nursing note reviewed.  Constitutional:      General: He is not in acute distress.    Appearance: He is well-developed. He is not ill-appearing or toxic-appearing.  HENT:     Head: Normocephalic and atraumatic.     Right Ear: Tympanic membrane, ear canal and external ear normal. Tympanic membrane is not erythematous, retracted or bulging.     Left Ear: Tympanic membrane, ear canal and external ear normal. Tympanic membrane is not erythematous, retracted or bulging.     Nose: Nose normal.     Right Sinus: No maxillary sinus tenderness or frontal sinus tenderness.     Left Sinus: No maxillary sinus tenderness or frontal sinus tenderness.     Mouth/Throat:     Pharynx: Uvula midline. No posterior oropharyngeal erythema.  Eyes:     General: Lids are normal.     Conjunctiva/sclera:  Conjunctivae normal.  Neck:     Trachea: Trachea normal.  Cardiovascular:     Rate and Rhythm: Normal rate and regular rhythm.     Pulses: Normal pulses.     Heart sounds: Normal heart sounds, S1 normal and S2 normal.  Pulmonary:     Effort: Pulmonary effort is normal.     Breath sounds: Normal breath sounds. No decreased breath sounds, wheezing, rhonchi or rales.  Lymphadenopathy:     Cervical: No cervical adenopathy.  Skin:    General: Skin is warm and dry.     Comments: Slightly raised, fluctuant area to R frontal area without induration  Neurological:     Mental Status: He is alert.     GCS: GCS eye subscore is 4. GCS verbal subscore is 5. GCS motor subscore is 6.  Psychiatric:        Speech: Speech normal.        Behavior: Behavior normal. Behavior is cooperative.    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   I, Inda Coke, [place], have reviewed all documentation for this visit. The documentation on 10/21/21 for the exam, diagnosis, procedures, and orders are all accurate and complete.   Inda Coke, PA-C

## 2021-10-21 ENCOUNTER — Encounter: Payer: Self-pay | Admitting: Physician Assistant

## 2021-10-21 ENCOUNTER — Other Ambulatory Visit: Payer: Self-pay

## 2021-10-21 ENCOUNTER — Ambulatory Visit (INDEPENDENT_AMBULATORY_CARE_PROVIDER_SITE_OTHER): Payer: Self-pay | Admitting: Physician Assistant

## 2021-10-21 VITALS — BP 100/68 | HR 66 | Temp 98.4°F | Ht 70.0 in | Wt 204.4 lb

## 2021-10-21 DIAGNOSIS — R229 Localized swelling, mass and lump, unspecified: Secondary | ICD-10-CM

## 2021-10-21 DIAGNOSIS — K51311 Ulcerative (chronic) rectosigmoiditis with rectal bleeding: Secondary | ICD-10-CM

## 2021-10-21 MED ORDER — PREDNISONE 20 MG PO TABS: 20.0000 mg | ORAL_TABLET | Freq: Every day | ORAL | 1 refills | Status: DC

## 2021-10-21 NOTE — Patient Instructions (Signed)
It was great to see you!  Prednisone as needed has been sent in  Reach out to [place] after you have insurance and we can further work-up your forehead mass  Please call GI to get on their schedule for when you should have insurance  Take care,  Inda Coke PA-C

## 2021-12-01 ENCOUNTER — Emergency Department (HOSPITAL_COMMUNITY): Payer: Self-pay

## 2021-12-01 ENCOUNTER — Emergency Department (HOSPITAL_COMMUNITY)
Admission: EM | Admit: 2021-12-01 | Discharge: 2021-12-01 | Disposition: A | Discharge status: Home / Self Care | Payer: Self-pay | Attending: Emergency Medicine | Admitting: Emergency Medicine

## 2021-12-01 ENCOUNTER — Encounter (HOSPITAL_COMMUNITY): Payer: Self-pay

## 2021-12-01 ENCOUNTER — Other Ambulatory Visit: Payer: Self-pay

## 2021-12-01 DIAGNOSIS — K51311 Ulcerative (chronic) rectosigmoiditis with rectal bleeding: Secondary | ICD-10-CM | POA: Insufficient documentation

## 2021-12-01 HISTORY — DX: Ulcerative (chronic) proctitis without complications: K51.20

## 2021-12-01 LAB — TYPE AND SCREEN
ABO/RH(D): B POS
Antibody Screen: NEGATIVE

## 2021-12-01 LAB — COMPREHENSIVE METABOLIC PANEL
ALT: 21 U/L (ref 0–44)
AST: 18 U/L (ref 15–41)
Albumin: 3.9 g/dL (ref 3.5–5.0)
Alkaline Phosphatase: 81 U/L (ref 38–126)
Anion gap: 6 (ref 5–15)
BUN: 17 mg/dL (ref 6–20)
CO2: 29 mmol/L (ref 22–32)
Calcium: 9.4 mg/dL (ref 8.9–10.3)
Chloride: 104 mmol/L (ref 98–111)
Creatinine, Ser: 1.42 mg/dL — ABNORMAL HIGH (ref 0.61–1.24)
GFR, Estimated: >60 mL/min (ref >60)
Glucose, Bld: 110 mg/dL — ABNORMAL HIGH (ref 70–99)
Potassium: 3.5 mmol/L (ref 3.5–5.1)
Sodium: 139 mmol/L (ref 135–145)
Total Bilirubin: 0.7 mg/dL (ref 0.3–1.2)
Total Protein: 7.2 g/dL (ref 6.5–8.1)

## 2021-12-01 LAB — CBC WITH DIFFERENTIAL/PLATELET
Abs Immature Granulocytes: 0.03 10*3/uL (ref 0.00–0.07)
Basophils Absolute: 0 10*3/uL (ref 0.0–0.1)
Basophils Relative: 0 %
Eosinophils Absolute: 0.2 10*3/uL (ref 0.0–0.5)
Eosinophils Relative: 2 %
HCT: 39.8 % (ref 39.0–52.0)
Hemoglobin: 13.5 g/dL (ref 13.0–17.0)
Immature Granulocytes: 0 %
Lymphocytes Relative: 28 %
Lymphs Abs: 3 10*3/uL (ref 0.7–4.0)
MCH: 31.4 pg (ref 26.0–34.0)
MCHC: 33.9 g/dL (ref 30.0–36.0)
MCV: 92.6 fL (ref 80.0–100.0)
Monocytes Absolute: 0.9 10*3/uL (ref 0.1–1.0)
Monocytes Relative: 8 %
Neutro Abs: 6.4 10*3/uL (ref 1.7–7.7)
Neutrophils Relative %: 62 %
Platelets: 288 10*3/uL (ref 150–400)
RBC: 4.3 MIL/uL (ref 4.22–5.81)
RDW: 12.8 % (ref 11.5–15.5)
WBC: 10.6 10*3/uL — ABNORMAL HIGH (ref 4.0–10.5)
nRBC: 0 % (ref 0.0–0.2)

## 2021-12-01 LAB — URINALYSIS, ROUTINE W REFLEX MICROSCOPIC
Bilirubin Urine: NEGATIVE
Glucose, UA: NEGATIVE mg/dL
Hgb urine dipstick: NEGATIVE
Ketones, ur: NEGATIVE mg/dL
Leukocytes,Ua: NEGATIVE
Nitrite: NEGATIVE
Protein, ur: NEGATIVE mg/dL
Specific Gravity, Urine: 1.015 (ref 1.005–1.030)
pH: 6 (ref 5.0–8.0)

## 2021-12-01 LAB — LIPASE, BLOOD: Lipase: 33 U/L (ref 11–51)

## 2021-12-01 MED ORDER — PREDNISONE 20 MG PO TABS: ORAL_TABLET | ORAL | 0 refills | Status: DC

## 2021-12-01 MED ORDER — MESALAMINE 1000 MG RE SUPP
1000.0000 mg | Freq: Once | RECTAL | Status: AC
  Administered 2021-12-01: 23:00:00 1000 mg via RECTAL
  Filled 2021-12-01: qty 1

## 2021-12-01 MED ORDER — MESALAMINE 400 MG PO CPDR
400.0000 mg | DELAYED_RELEASE_CAPSULE | Freq: Once | ORAL | Status: DC
  Filled 2021-12-01: qty 1

## 2021-12-01 MED ORDER — IOHEXOL 350 MG/ML SOLN
80.0000 mL | Freq: Once | INTRAVENOUS | Status: AC | PRN
  Administered 2021-12-01: 16:00:00 80 mL via INTRAVENOUS

## 2021-12-01 NOTE — Discharge Instructions (Addendum)
Please follow up with your gastroenterologist. Follow up with GI regarding steroid taper.   Return to the Emergency Dept for any worsening or worrisome symptoms.

## 2021-12-01 NOTE — ED Provider Notes (Signed)
[facility] DEPT Provider Note   CSN: [id number] Arrival date & time: 12/01/21  1150     History Chief Complaint  Patient presents with   Blood In Stools    Kenneth Terrell is a 30 y.o. male.  This is a 30 y.o. male with significant medical history as below, including ulcerative sigmoiditis who presents to the ED with complaint of blood in rectum.  Patient reports he noticed blood in stool the past week, was started on oral prednisone and symptoms have continued. He has no acute abdominal discomfort. He takes mesalamine rectal suppositories but due to insurance issues it has become cost prohibitive to take them until after Jan 1. He has appt with GI on Jan 18. He is tolerating PO, no abdominal or rectal pain. Bleeding consistent with prior flares of his ulcerative sigmoiditis. No fevers, chills, dib, n/v, change to urination. No excessive fatigue or weakness.   The history is provided by the patient. No language interpreter was used.      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 Towanda Malkin)       Family History  Problem Relation Age of Onset   Drug abuse Father    CAD Other        MI; angina   Cancer Other        colon   Diabetes Maternal Uncle    Cancer Maternal Grandmother        lung cancer met. to brain   Depression Maternal Grandmother    Stroke Maternal Grandmother    Colon cancer Maternal Grandmother     Social History   Tobacco Use   Smoking status: Never   Smokeless tobacco: Never  Vaping Use   Vaping Use: Never used  Substance Use Topics   Alcohol use: No    Alcohol/week: 0.0 standard drinks   Drug use: No    Home Medications Prior to Admission medications   Medication Sig Start Date End Date Taking? Authorizing Provider  predniSONE (DELTASONE) 20 MG tablet Take 2 tablets (40 mg total) by mouth daily for 14 days, THEN 1 tablet (20 mg total) daily for 7 days, THEN 0.5 tablets (10 mg total) daily for 7 days. 12/01/21 12/29/21 Yes Jeanell Sparrow, DO    Allergies  Patient has no known allergies.  Review of Systems   Review of Systems  Constitutional:  Negative for chills and fever.  HENT:  Negative for facial swelling and trouble swallowing.   Eyes:  Negative for photophobia and visual disturbance.  Respiratory:  Negative for cough and shortness of breath.   Cardiovascular:  Negative for chest pain and palpitations.  Gastrointestinal:  Positive for blood in stool. Negative for abdominal pain, nausea and vomiting.  Endocrine: Negative for polydipsia and polyuria.  Genitourinary:  Negative for difficulty urinating and hematuria.  Musculoskeletal:  Negative for gait problem and joint swelling.  Skin:  Negative for pallor and rash.  Neurological:  Negative for syncope and headaches.   Psychiatric/Behavioral:  Negative for agitation and confusion.    Physical Exam Updated Vital Signs BP 118/80 (BP Location: Right Arm)    Pulse (!) 47    Temp 98 F (36.7 C) (Oral)    Resp 14    Ht '5\' 10"'  (1.778 m)    Wt 92.5 kg    SpO2 100%    BMI 29.27 kg/m   Physical Exam Vitals and nursing note reviewed.  Constitutional:      General: He is not in acute distress.    Appearance: He is well-developed.  HENT:     Head: Normocephalic and atraumatic.     Right Ear: External ear normal.     Left Ear: External ear normal.     Mouth/Throat:     Mouth: Mucous membranes are moist.  Eyes:     General: No scleral icterus.    Conjunctiva/sclera: Conjunctivae normal.  Cardiovascular:     Rate and Rhythm: Normal rate and regular rhythm.     Pulses: Normal pulses.     Heart sounds: Normal heart sounds.  Pulmonary:     Effort: Pulmonary effort is normal. No respiratory distress.     Breath sounds: Normal breath sounds.  Abdominal:     General: Abdomen is flat.     Palpations: Abdomen is soft.     Tenderness: There is no abdominal tenderness.  Musculoskeletal:        General: Normal range of motion.     Cervical back: Normal range of motion.     Right lower leg: No edema.     Left lower leg: No edema.  Skin:    General: Skin is warm and dry.     Capillary Refill: Capillary refill takes less than 2 seconds.  Neurological:     Mental Status: He is alert and oriented to person, place, and time.  Psychiatric:        Mood and Affect: Mood normal.        Behavior: Behavior normal.    ED Results / Procedures / Treatments   Labs (all labs ordered are listed, but only abnormal results are displayed) Labs Reviewed  COMPREHENSIVE METABOLIC PANEL - Abnormal; Notable for the following components:      Result Value   Glucose, Bld 110 (*)    Creatinine, Ser 1.42 (*)    All other components within normal limits  CBC WITH DIFFERENTIAL/PLATELET - Abnormal; Notable for the following  components:   WBC 10.6 (*)    All other components within normal limits  URINALYSIS, ROUTINE W REFLEX MICROSCOPIC - Abnormal; Notable for the following components:   Color, Urine STRAW (*)    All other components within normal limits  LIPASE, BLOOD  TYPE AND SCREEN    EKG None  Radiology CT ABDOMEN PELVIS  W CONTRAST  Result Date: 12/01/2021 CLINICAL DATA:  Left lower quadrant abdominal pain. EXAM: CT ABDOMEN AND PELVIS WITH CONTRAST TECHNIQUE: Multidetector CT imaging of the abdomen and pelvis was performed using the standard protocol following bolus administration of intravenous contrast. CONTRAST:  45m OMNIPAQUE IOHEXOL 350 MG/ML SOLN COMPARISON:  None. FINDINGS: Lower chest: No acute abnormality. Hepatobiliary: No suspicious hepatic lesion. Gallbladder is unremarkable. No biliary ductal dilation. Pancreas: No pancreatic ductal dilation or evidence of acute inflammation. Spleen: Within normal limits. Adrenals/Urinary Tract: Bilateral adrenal glands are unremarkable. No hydronephrosis. No solid enhancing renal mass. Urinary bladder is unremarkable for degree of distension. Stomach/Bowel: No enteric contrast was administered. Stomach is unremarkable for degree of distension. No pathologic dilation of small or large bowel. Terminal ileum appears normal. The appendix is not confidently identified however there is no pericecal inflammation. No evidence of acute bowel inflammation. Vascular/Lymphatic: No abdominal aortic aneurysm. No pathologically enlarged abdominal or pelvic lymph nodes. Reproductive: Prostate is unremarkable. Other: No significant abdominopelvic free fluid. No pneumoperitoneum. Musculoskeletal: No acute or significant osseous findings. IMPRESSION: No acute findings in the abdomen or pelvis. Electronically Signed   By: JDahlia BailiffM.D.   On: 12/01/2021 16:14    Procedures Procedures   Medications Ordered in ED Medications  iohexol (OMNIPAQUE) 350 MG/ML injection 80 mL (80  mLs Intravenous Contrast Given 12/01/21 1553)  mesalamine (CANASA) suppository 1,000 mg (1,000 mg Rectal Given 12/01/21 2256)  mesalamine (CANASA) suppository 1,000 mg (1,000 mg Rectal Given 12/01/21 2256)    ED Course  I have reviewed the triage vital signs and the nursing notes.  Pertinent labs & imaging results that were available during my care of the patient were reviewed by me and considered in my medical decision making (see chart for details).    MDM Rules/Calculators/A&P                          CC: rectal bleeding  This patient complains of above; this involves an extensive number of treatment options and is a complaint that carries with it a high risk of complications and morbidity. Vital signs were reviewed. Serious etiologies considered.  Record review:  Previous records obtained and reviewed   Work up as above, notable for:  Labs & imaging results that were available during my care of the patient were reviewed by me and considered in my medical decision making.   I ordered imaging studies which included CT a/p and I independently visualized and interpreted imaging which showed no acute findings.  Hemoglobin 13.5.  There is no conjunctival pallor, he is hemodynamically stable. Cr similar to his baseline.   Management: Patient given mesalamine.  Reassessment:  Patient is well-appearing, bleeding controlled.  Advised patient continue steroids 437mqd then start taper. Advised him to call his GI specialist tomorrow morning for a sooner appointment.  Advised continued home medications as prescribed. Strict return precautions were discussed.  The patient improved significantly and was discharged in stable condition. Detailed discussions were had with the patient regarding current findings, and need for close f/u with PCP or on call doctor. The patient has been instructed to return immediately if the symptoms worsen in any way for re-evaluation. Patient verbalized  understanding and is in agreement with current care plan. All questions answered prior to discharge.           This chart was dictated using voice recognition software.  Despite best efforts to proofread,  errors can occur which  can change the documentation meaning.    Final Clinical Impression(s) / ED Diagnoses Final diagnoses:  Ulcerative rectosigmoiditis with rectal bleeding [facility])    Rx / DC Orders ED Discharge Orders          Ordered    predniSONE (DELTASONE) 20 MG tablet        12/01/21 2149             Jeanell Sparrow, DO 12/02/21 [phone]

## 2021-12-01 NOTE — ED Provider Notes (Signed)
Emergency Medicine Provider Triage Evaluation Note  Kenneth Terrell , a 30 y.o. male  was evaluated in triage.  Pt complains of rectal bleeding.  Patient reports that he has had intermittent rectal bleeding since July.  Denies any change in his bleeding recently.  Patient also complains of intermittent pain to left lower quadrant.  Endorses fatigue.  Per chart review patient has history of ulcerative rectosigmoiditis.  States that he was prescribed prednisone around Thanksgiving and has been taking this intermittently since then.  Review of Systems  Positive: Fatigue, rectal bleeding, left lower quadrant abdominal pain Negative: Fever, chills, nausea, vomiting, diarrhea, constipation, dysuria, hematuria, urinary urgency, shortness of breath, syncope  Physical Exam  BP (!) 149/76    Pulse 61    Temp 98.1 F (36.7 C) (Oral)    Resp 16    Ht 5\' 10"  (1.778 m)    Wt 92.5 kg    SpO2 99%    BMI 29.27 kg/m  Gen:   Awake, no distress   Resp:  Normal effort  MSK:   Moves extremities without difficulty  Other:  Abdomen soft, nondistended, nontender.  Medical Decision Making  Medically screening exam initiated at 12:42 PM.  Appropriate orders placed.  Kenneth Terrell was informed that the remainder of the evaluation will be completed by another provider, this initial triage assessment does not replace that evaluation, and the importance of remaining in the ED until their evaluation is complete.  Will obtain abdominal labs work-up and type and screen.  Due to patient's history of ulcerative rectosigmoiditis will obtain CT abdomen pelvis.   [name], PA-C 12/01/21 1244    12/03/21, DO 12/02/21 1619

## 2021-12-01 NOTE — ED Triage Notes (Addendum)
Patient reports he has had intermittent rectal bleeding since July. Patient reports that he has ulcerative sigmoiditis. Patient states he was started on Prednisone yesterday. Patient c/o LLQ abdominal pain, fatigue. Patient denies N/v/D.
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
## 2021-12-11 ENCOUNTER — Encounter: Payer: Self-pay | Admitting: Physician Assistant

## 2021-12-11 ENCOUNTER — Other Ambulatory Visit: Payer: Self-pay

## 2021-12-11 ENCOUNTER — Ambulatory Visit (INDEPENDENT_AMBULATORY_CARE_PROVIDER_SITE_OTHER): Payer: 59 | Admitting: Physician Assistant

## 2021-12-11 VITALS — BP 110/60 | HR 60 | Temp 98.6°F | Ht 70.0 in | Wt 198.4 lb

## 2021-12-11 DIAGNOSIS — M25562 Pain in left knee: Secondary | ICD-10-CM | POA: Diagnosis not present

## 2021-12-11 DIAGNOSIS — R229 Localized swelling, mass and lump, unspecified: Secondary | ICD-10-CM

## 2021-12-11 NOTE — Patient Instructions (Signed)
It was great to see you!  A referral has been placed for you to see Dr. Clementeen Graham with [facility]. Someone from their office will be in touch soon regarding your appointment with him. His location: Psychiatrist Medicine at [facility] [address] on the 1st floor.   Phone number [phone], Fax [phone].  This location is across the street from the entrance to [employer] and in the same complex as the [facility]   I'm also going to put in a referral for you to have an ultrasound of your head. This will be done at [facility]. Someone will call you about this appointment.   If a referral was placed today, you will be contacted for an appointment. Please note that routine referrals can sometimes take up to 3-4 weeks to process. Please call our office if you haven't heard anything after this time frame.  Take care,  Jarold Motto PA-C

## 2021-12-12 ENCOUNTER — Encounter: Payer: Self-pay | Admitting: Family Medicine

## 2021-12-12 ENCOUNTER — Other Ambulatory Visit: Payer: Self-pay

## 2021-12-12 ENCOUNTER — Ambulatory Visit: Payer: Self-pay

## 2021-12-12 ENCOUNTER — Ambulatory Visit (INDEPENDENT_AMBULATORY_CARE_PROVIDER_SITE_OTHER): Payer: 59 | Admitting: Family Medicine

## 2021-12-12 VITALS — BP 120/78 | HR 62 | Ht 70.0 in | Wt 200.0 lb

## 2021-12-12 DIAGNOSIS — M25562 Pain in left knee: Secondary | ICD-10-CM | POA: Diagnosis not present

## 2021-12-12 NOTE — Patient Instructions (Addendum)
Good to see you today.  Con't w/ the exercises you've been doing to strengthen your hamstring.  Follow-up: as needed
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
## 2021-12-17 ENCOUNTER — Ambulatory Visit (INDEPENDENT_AMBULATORY_CARE_PROVIDER_SITE_OTHER): Payer: 59 | Admitting: Family Medicine

## 2021-12-17 ENCOUNTER — Ambulatory Visit: Payer: Self-pay

## 2021-12-17 ENCOUNTER — Other Ambulatory Visit: Payer: Self-pay

## 2021-12-17 ENCOUNTER — Ambulatory Visit (INDEPENDENT_AMBULATORY_CARE_PROVIDER_SITE_OTHER): Payer: 59

## 2021-12-17 VITALS — BP 124/82 | HR 60 | Ht 70.0 in | Wt 197.2 lb

## 2021-12-17 DIAGNOSIS — M25561 Pain in right knee: Secondary | ICD-10-CM
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
## 2021-12-17 NOTE — Patient Instructions (Addendum)
Thank you for coming in today.   Please use Voltaren gel (Generic Diclofenac Gel) up to 4x daily for pain as needed.  This is available over-the-counter as both the name brand Voltaren gel and the generic diclofenac gel.   Please go to [facility] supply to get the hinged knee brace we talked about today. You may also be able to get it from [employer].    If not improving quickly, please let me know, and we will proceed to a MRI

## 2021-12-17 NOTE — Progress Notes (Signed)
Knee x-ray shows no fractures.

## 2021-12-18 DIAGNOSIS — R69 Illness, unspecified: Secondary | ICD-10-CM | POA: Diagnosis not present

## 2021-12-19 ENCOUNTER — Ambulatory Visit
Admission: RE | Admit: 2021-12-19 | Discharge: 2021-12-19 | Disposition: A | Discharge status: Home / Self Care | Payer: 59 | Source: Ambulatory Visit | Attending: Physician Assistant | Admitting: Physician Assistant

## 2021-12-19 DIAGNOSIS — R220 Localized swelling, mass and lump, head: Secondary | ICD-10-CM | POA: Diagnosis not present

## 2021-12-19 DIAGNOSIS — L989 Disorder of the skin and subcutaneous tissue, unspecified: Secondary | ICD-10-CM | POA: Diagnosis not present

## 2021-12-19 DIAGNOSIS — R229 Localized swelling, mass and lump, unspecified: Secondary | ICD-10-CM

## 2021-12-24 DIAGNOSIS — K51211 Ulcerative (chronic) proctitis with rectal bleeding: Secondary | ICD-10-CM | POA: Diagnosis not present

## 2022-01-22 DIAGNOSIS — K5120 Ulcerative (chronic) proctitis without complications: Secondary | ICD-10-CM | POA: Diagnosis not present

## 2022-01-26 ENCOUNTER — Telehealth: Payer: Self-pay

## 2022-01-26 NOTE — Telephone Encounter (Addendum)
Chart shows pt has new PCP, PA Inda Coke.    Lvm asking pt to call back.  Need to confirm if pt does have new PCP.

## 2022-01-27 NOTE — Telephone Encounter (Signed)
Chart shows pt has new PCP, PA Samantha Worley.   ° °Lvm asking pt to call back.  Need to confirm if pt does have new PCP.  °

## 2022-01-28 NOTE — Telephone Encounter (Signed)
Chart shows pt has new PCP, PA Jarold Motto.    Lvm asking pt to call back.  Need to confirm if pt does have new PCP.   Also, sending a MyChart message.

## 2022-04-23 ENCOUNTER — Ambulatory Visit (INDEPENDENT_AMBULATORY_CARE_PROVIDER_SITE_OTHER): Payer: 59 | Admitting: Physician Assistant

## 2022-04-23 ENCOUNTER — Encounter: Payer: Self-pay | Admitting: Physician Assistant

## 2022-04-23 ENCOUNTER — Other Ambulatory Visit (HOSPITAL_COMMUNITY)
Admission: RE | Admit: 2022-04-23 | Discharge: 2022-04-23 | Disposition: A | Discharge status: Home / Self Care | Payer: 59 | Source: Ambulatory Visit | Attending: Physician Assistant | Admitting: Physician Assistant

## 2022-04-23 VITALS — BP 108/76 | HR 55 | Temp 98.3°F | Ht 70.0 in | Wt 206.0 lb

## 2022-04-23 DIAGNOSIS — Z113 Encounter for screening for infections with a predominantly sexual mode of transmission: Secondary | ICD-10-CM | POA: Insufficient documentation

## 2022-04-23 DIAGNOSIS — Z0000 Encounter for general adult medical examination without abnormal findings: Secondary | ICD-10-CM

## 2022-04-23 DIAGNOSIS — Z13220 Encounter for screening for lipoid disorders: Secondary | ICD-10-CM

## 2022-04-23 DIAGNOSIS — L910 Hypertrophic scar: Secondary | ICD-10-CM | POA: Diagnosis not present

## 2022-04-23 DIAGNOSIS — Z1159 Encounter for screening for other viral diseases: Secondary | ICD-10-CM | POA: Diagnosis not present

## 2022-04-23 DIAGNOSIS — R21 Rash and other nonspecific skin eruption: Secondary | ICD-10-CM | POA: Diagnosis not present

## 2022-04-23 DIAGNOSIS — Z136 Encounter for screening for cardiovascular disorders: Secondary | ICD-10-CM

## 2022-04-23 DIAGNOSIS — K51311 Ulcerative (chronic) rectosigmoiditis with rectal bleeding: Secondary | ICD-10-CM

## 2022-04-23 DIAGNOSIS — R69 Illness, unspecified: Secondary | ICD-10-CM | POA: Diagnosis not present

## 2022-04-23 LAB — LIPID PANEL
Cholesterol: 198 mg/dL (ref 0–200)
HDL: 56.7 mg/dL (ref >39.00)
LDL Cholesterol: 127 mg/dL — ABNORMAL HIGH (ref 0–99)
NonHDL: 141.66
Total CHOL/HDL Ratio: 3
Triglycerides: 73 mg/dL (ref 0.0–149.0)
VLDL: 14.6 mg/dL (ref 0.0–40.0)

## 2022-04-23 LAB — CBC WITH DIFFERENTIAL/PLATELET
Basophils Absolute: 0 10*3/uL (ref 0.0–0.1)
Basophils Relative: 0.2 % (ref 0.0–3.0)
Eosinophils Absolute: 0 10*3/uL (ref 0.0–0.7)
Eosinophils Relative: 0.7 % (ref 0.0–5.0)
HCT: 35.8 % — ABNORMAL LOW (ref 39.0–52.0)
Hemoglobin: 11.3 g/dL — ABNORMAL LOW (ref 13.0–17.0)
Lymphocytes Relative: 27.8 % (ref 12.0–46.0)
Lymphs Abs: 1.5 10*3/uL (ref 0.7–4.0)
MCHC: 31.7 g/dL (ref 30.0–36.0)
MCV: 78.3 fl (ref 78.0–100.0)
Monocytes Absolute: 0.5 10*3/uL (ref 0.1–1.0)
Monocytes Relative: 8.3 % (ref 3.0–12.0)
Neutro Abs: 3.5 10*3/uL (ref 1.4–7.7)
Neutrophils Relative %: 63 % (ref 43.0–77.0)
Platelets: 299 10*3/uL (ref 150.0–400.0)
RBC: 4.57 Mil/uL (ref 4.22–5.81)
RDW: 14.6 % (ref 11.5–15.5)
WBC: 5.5 10*3/uL (ref 4.0–10.5)

## 2022-04-23 LAB — COMPREHENSIVE METABOLIC PANEL
ALT: 26 U/L (ref 0–53)
AST: 28 U/L (ref 0–37)
Albumin: 4.5 g/dL (ref 3.5–5.2)
Alkaline Phosphatase: 71 U/L (ref 39–117)
BUN: 11 mg/dL (ref 6–23)
CO2: 30 mEq/L (ref 19–32)
Calcium: 10.1 mg/dL (ref 8.4–10.5)
Chloride: 101 mEq/L (ref 96–112)
Creatinine, Ser: 1.48 mg/dL (ref 0.40–1.50)
GFR: 62.49 mL/min (ref >60.00)
Glucose, Bld: 87 mg/dL (ref 70–99)
Potassium: 4.3 mEq/L (ref 3.5–5.1)
Sodium: 136 mEq/L (ref 135–145)
Total Bilirubin: 0.8 mg/dL (ref 0.2–1.2)
Total Protein: 7.6 g/dL (ref 6.0–8.3)

## 2022-04-23 LAB — C-REACTIVE PROTEIN: CRP: <1 mg/dL (ref 0.5–20.0)

## 2022-04-23 LAB — SEDIMENTATION RATE: Sed Rate: 52 mm/hr — ABNORMAL HIGH (ref 0–15)

## 2022-04-23 MED ORDER — MUPIROCIN 2 % EX OINT: TOPICAL_OINTMENT | CUTANEOUS | 0 refills | Status: DC

## 2022-04-23 NOTE — Patient Instructions (Addendum)
It was great to see you!  We will place referral for [facility] Use bactroban cream for your rash  Please go to the lab for blood work.   Our office will call you with your results unless you have chosen to receive results via MyChart.  If your blood work is normal we will follow-up each year for physicals and as scheduled for chronic medical problems.  If anything is abnormal we will treat accordingly and get you in for a follow-up.  Take care,  Aldona Bar

## 2022-04-23 NOTE — Progress Notes (Signed)
Subjective:    Kenneth Terrell is a 31 y.o. male and is here for a comprehensive physical exam.  HPI   Health Maintenance Due  Topic Date Due   Hepatitis C Screening  Never done   COVID-19 Vaccine (2 - Pfizer series) 07/29/2020    Acute Concerns: Keloid Patient has noticed a keloid behind his L ear piercing. He notes he had similar issue in the past and was seen by [facility] dermatologist and was managing well. States he was seeing Dr. Lanae Crumbly and he would like to be referred for this issue.   Rash He recently waxed his chest about 3 weeks ago and broke out in a rash that was very itchy. This the first time he was waxed. Denies: fevers, chills. Some of the spots have opened up.   Chronic Issues: UC  Currently well controlled. Consuming sea moss. Currently not taking his mesalamine. Would like this inflammatory markers checked.  Health Maintenance: Immunizations -- UTD Covid-UTD, 07/08/2020 Colonoscopy -- N/A PSA -- No results found for: PSA1, PSA Diet -- Trying to follow healthy diet. Sleep habits -- No concerns  Exercise -- Regularly  Weight -- 206 Ib (93.4 kg) Weight history Wt Readings from Last 10 Encounters:  12/17/21 197 lb 3.2 oz (89.4 kg)  12/12/21 200 lb (90.7 kg)  12/11/21 198 lb 6.1 oz (90 kg)  12/01/21 204 lb (92.5 kg)  10/21/21 204 lb 6.1 oz (92.7 kg)  10/07/21 203 lb 3.2 oz (92.2 kg)  01/10/21 203 lb (92.1 kg)  01/04/21 200 lb (90.7 kg)  09/20/20 209 lb (94.8 kg)  08/30/20 207 lb 6.4 oz (94.1 kg)   There is no height or weight on file to calculate BMI. Mood -- Better  Tobacco use -- None  Tobacco Use: Low Risk    Smoking Tobacco Use: Never   Smokeless Tobacco Use: Never   Passive Exposure: Not on file    Alcohol use ---  reports no history of alcohol use.      04/21/2018    9:57 AM  Depression screen PHQ 2/9  Decreased Interest 2  Down, Depressed, Hopeless 0  PHQ - 2 Score 2  Altered sleeping 0  Tired, decreased energy 3  Change in  appetite 0  Feeling bad or failure about yourself  0  Trouble concentrating 0  Moving slowly or fidgety/restless 0  Suicidal thoughts 0  PHQ-9 Score 5     Other providers/specialists: Patient Care Team: Inda Coke, [place] as PCP - General (Physician Assistant)   PMHx, SurgHx, SocialHx, Medications, and Allergies were reviewed in the Visit Navigator and updated as appropriate.   Past Medical History:  Diagnosis Date   ADHD 01/11/2008   3 YOA    BIPOLAR DISORDER UNSPECIFIED 01/11/2008   actually thought more depression - ?misdiagnosis   History of chlamydia 12/07/2006   treated   Ulcerative colitis ([place]) 03/07/2016   by colonoscopy   Ulcerative proctitis [facility])      Past Surgical History:  Procedure Laterality Date   Charter Admission  1999   x 1 week   COLONOSCOPY  03/2016   Ulcerative colitis by biopsy ([place])   EXTERNAL EAR SURGERY     KELOID EXCISION  10/2007   left ear (Dr. Towanda Malkin)     Family History  Problem Relation Age of Onset   Drug abuse Father    CAD Other        MI; angina   Cancer Other  colon   Diabetes Maternal Uncle    Cancer Maternal Grandmother        lung cancer met. to brain   Depression Maternal Grandmother    Stroke Maternal Grandmother    Colon cancer Maternal Grandmother     Social History   Tobacco Use   Smoking status: Never   Smokeless tobacco: Never  Vaping Use   Vaping Use: Never used  Substance Use Topics   Alcohol use: No    Alcohol/week: 0.0 standard drinks   Drug use: No    Review of Systems:   Review of Systems  Constitutional:  Negative for chills, fever, malaise/fatigue and weight loss.  HENT:  Negative for hearing loss, sinus pain and sore throat.   Respiratory:  Negative for cough and hemoptysis.   Cardiovascular:  Negative for chest pain, palpitations, leg swelling and PND.  Gastrointestinal:  Negative for abdominal pain, constipation, diarrhea, heartburn, nausea and vomiting.  Genitourinary:   Negative for dysuria, frequency and urgency.  Musculoskeletal:  Negative for back pain, myalgias and neck pain.  Skin:  Negative for itching and rash.  Neurological:  Negative for dizziness, tingling, seizures and headaches.  Endo/Heme/Allergies:  Negative for polydipsia.  Psychiatric/Behavioral:  Negative for depression. The patient is not nervous/anxious.     Objective:   There were no vitals filed for this visit. There is no height or weight on file to calculate BMI.  General Appearance:  Alert, cooperative, no distress, appears stated age  Head:  Normocephalic, without obvious abnormality, atraumatic  Eyes:  PERRL, conjunctiva/corneas clear, EOM's intact, fundi benign, both eyes       Ears:  Normal TM's and external ear canals, both ears  Nose: Nares normal, septum midline, mucosa normal, no drainage    or sinus tenderness  Throat: Lips, mucosa, and tongue normal; teeth and gums normal  Neck: Supple, symmetrical, trachea midline, no adenopathy; thyroid:  No enlargement/tenderness/nodules; no carotit bruit or JVD  Back:   Symmetric, no curvature, ROM normal, no CVA tenderness  Lungs:   Clear to auscultation bilaterally, respirations unlabored  Chest wall:  No tenderness or deformity  Heart:  Regular rate and rhythm, S1 and S2 normal, no murmur, rub   or gallop  Abdomen:   Soft, non-tender, bowel sounds active all four quadrants, no masses, no organomegaly  Extremities: Extremities normal, atraumatic, no cyanosis or edema  Prostate: Not done.   Skin: Skin color, texture, turgor normal Pustular rash on upper chest without obvious surrounding erythema Keloid present on posterior L ear lobe  Lymph nodes: Cervical, supraclavicular, and axillary nodes normal  Neurologic: CNII-XII grossly intact. Normal strength, sensation and reflexes throughout    Assessment/Plan:   Routine physical examination Today patient counseled on age appropriate routine health concerns for screening and  prevention, each reviewed and up to date or declined. Immunizations reviewed and up to date or declined. Labs ordered and reviewed. Risk factors for depression reviewed and negative. Hearing function and visual acuity are intact. ADLs screened and addressed as needed. Functional ability and level of safety reviewed and appropriate. Education, counseling and referrals performed based on assessed risks today. Patient provided with a copy of personalized plan for preventive services.  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  [x]   Stressed the importance of regular exercise.   []   Substance Abuse: Discussed cessation/primary prevention of tobacco, alcohol, or other drug use; driving or other dangerous activities under the influence; availability of treatment for abuse.   [x]   Injury prevention: Discussed safety belts, safety helmets, smoke detector, smoking near bedding or upholstery.   []   Sexuality: Discussed sexually transmitted diseases, partner selection, use of condoms, avoidance of unintended pregnancy  and contraceptive alternatives.   [x]   Dental health: Discussed importance of regular tooth brushing, flossing, and dental visits.  [x]   Health maintenance and immunizations reviewed. Please  refer to Health maintenance section.     I,Savera Zaman,acting as a Education administrator for [employer], PA.,have documented all relevant documentation on the behalf of Inda Coke, PA,as directed by  Inda Coke, PA while in the presence of Inda Coke, [place].   I, Inda Coke, [place], have reviewed all documentation for this visit. The documentation on 04/23/22 for the exam, diagnosis, procedures, and orders are all accurate and complete.   Inda Coke, PA-C [place]

## 2022-04-24 LAB — HEPATITIS C ANTIBODY
Hepatitis C Ab: NONREACTIVE
SIGNAL TO CUT-OFF: 0.25 (ref <1.00)

## 2022-04-24 LAB — URINE CYTOLOGY ANCILLARY ONLY
Chlamydia: NEGATIVE
Comment: NEGATIVE
Comment: NEGATIVE
Comment: NORMAL
Neisseria Gonorrhea: NEGATIVE
Trichomonas: NEGATIVE

## 2022-04-24 LAB — RPR: RPR Ser Ql: NONREACTIVE

## 2022-04-24 LAB — HIV ANTIBODY (ROUTINE TESTING W REFLEX): HIV 1&2 Ab, 4th Generation: NONREACTIVE

## 2022-04-25 ENCOUNTER — Other Ambulatory Visit: Payer: Self-pay | Admitting: Physician Assistant

## 2022-04-25 DIAGNOSIS — R700 Elevated erythrocyte sedimentation rate: Secondary | ICD-10-CM

## 2022-05-08 ENCOUNTER — Other Ambulatory Visit (INDEPENDENT_AMBULATORY_CARE_PROVIDER_SITE_OTHER): Payer: 59

## 2022-05-08 ENCOUNTER — Ambulatory Visit: Payer: 59 | Admitting: Physician Assistant

## 2022-05-08 DIAGNOSIS — R700 Elevated erythrocyte sedimentation rate: Secondary | ICD-10-CM | POA: Diagnosis not present

## 2022-05-08 LAB — SEDIMENTATION RATE: Sed Rate: 41 mm/hr — ABNORMAL HIGH (ref 0–15)

## 2022-06-15 DIAGNOSIS — K51219 Ulcerative (chronic) proctitis with unspecified complications: Secondary | ICD-10-CM | POA: Diagnosis not present

## 2022-06-23 DIAGNOSIS — L910 Hypertrophic scar: Secondary | ICD-10-CM | POA: Diagnosis not present

## 2022-07-13 NOTE — Progress Notes (Deleted)
   I, Kenneth Terrell, LAT, ATC acting as a scribe for Kenneth Leader, MD.  Kenneth Terrell is a 31 y.o. male who presents to [place] [facility] today for shoulder pain. Pt was previously seen by Dr. Georgina Snell on 12/17/21 for R knee pain. Today, pt c/o shoulder pain x /. Pt locates paint to  Neck pain: Radiates: UE numbness/tingling: UE weakness: Aggravates: Treatments tried:  Dx imaging: 01/04/21 T-spine & chest XR 08/07/20 L shoulder XR  Pertinent review of systems: ***  Relevant historical information: ***   Exam:  There were no vitals taken for this visit. General: Well Developed, well nourished, and in no acute distress.   MSK: ***    Lab and Radiology Results No results found for this or any previous visit (from the past 72 hour(s)). No results found.     Assessment and Plan: 31 y.o. male with ***   PDMP not reviewed this encounter. No orders of the defined types were placed in this encounter.  No orders of the defined types were placed in this encounter.    Discussed warning signs or symptoms. Please see discharge instructions. Patient expresses understanding.   ***

## 2022-07-14 ENCOUNTER — Ambulatory Visit: Payer: 59 | Admitting: Family Medicine

## 2022-09-23 ENCOUNTER — Telehealth: Payer: Self-pay | Admitting: *Deleted

## 2022-09-23 NOTE — Chronic Care Management (AMB) (Signed)
  Care Coordination   Note   09/23/2022 Name: SHAKA CARDIN MRN: [id number] DOB: 03-09-1991  Andres Labrum is a 31 y.o. year old male who sees [place], [place], [place] for primary care. I reached out to Andres Labrum by phone today to offer care coordination services.  Mr. Ambler was given information about Care Coordination services today including:   The Care Coordination services include support from the care team which includes your Nurse Coordinator, Clinical Social Worker, or Pharmacist.  The Care Coordination team is here to help remove barriers to the health concerns and goals most important to you. Care Coordination services are voluntary, and the patient may decline or stop services at any time by request to their care team member.   Care Coordination Consent Status: Patient agreed to services and verbal consent obtained.   Follow up plan:  Telephone appointment with care coordination team member scheduled for:  09/24/2022  Encounter Outcome:  Pt. Scheduled  Julian Hy, [place] Direct Dial: [phone]

## 2022-09-24 ENCOUNTER — Ambulatory Visit: Payer: Self-pay | Admitting: *Deleted

## 2022-09-24 NOTE — Patient Outreach (Signed)
  Care Coordination   09/24/2022 Name: MANNY VITOLO MRN: [id number] DOB: 1990/12/24   Care Coordination Outreach Attempts:  An unsuccessful telephone outreach was attempted for a scheduled appointment today.  Follow Up Plan:  Additional outreach attempts will be made to offer the patient care coordination information and services.   Encounter Outcome:  No Answer  Care Coordination Interventions Activated:  No   Care Coordination Interventions:  No, not indicated    Raina Mina, RN Care Management Coordinator [place] Office [phone]

## 2022-09-29 ENCOUNTER — Telehealth: Payer: Self-pay | Admitting: *Deleted

## 2022-09-29 NOTE — Chronic Care Management (AMB) (Signed)
  Care Coordination Note  09/29/2022 Name: SCOTT VANDERVEER MRN: [id number] DOB: 1991-03-23  Andres Labrum is a 31 y.o. year old male who is a primary care patient of Inda Coke, [place] and is actively engaged with the care management team. I reached out to Andres Labrum by phone today to assist with re-scheduling an initial visit with the RN Case Manager  Follow up plan: Unsuccessful telephone outreach attempt made. A HIPAA compliant phone message was left for the patient providing contact information and requesting a return call.   Julian Hy, [place] Direct Dial: [phone]

## 2022-10-14 NOTE — Progress Notes (Signed)
  Care Coordination Note  10/14/2022 Name: YOANDRI CONGROVE MRN: [id number] DOB: 04/19/1991  Andres Labrum is a 31 y.o. year old male who is a primary care patient of Inda Coke, [place] and is actively engaged with the care management team. I reached out to Andres Labrum by phone today to assist with re-scheduling an initial visit with the RN Case Manager  Follow up plan: Telephone appointment with care management team member scheduled for: 11/03/2022   Julian Hy, [place] Direct Dial: [phone]

## 2022-11-03 ENCOUNTER — Ambulatory Visit: Payer: Self-pay | Admitting: *Deleted

## 2022-11-03 NOTE — Patient Outreach (Signed)
  Care Coordination   Initial Visit Note   11/03/2022 Name: SEBASTION JUN MRN: [id number] DOB: [birth year]  Andres Labrum is a 31 y.o. year old male who sees [place], [place], [place] for primary care. I spoke with  Andres Labrum by phone today.  What matters to the patients health and wellness today?  na    Goals Addressed   None     SDOH assessments and interventions completed:  No   Care Coordination Interventions:  No, not indicated   Follow up plan: Pt requested a call back 11/13/2022 @ 1:00 PM   Encounter Outcome:  Pt. Request to Call Back   Raina Mina, RN Care Management Coordinator [place] Office [phone]

## 2022-11-13 ENCOUNTER — Ambulatory Visit: Payer: Self-pay | Admitting: *Deleted

## 2022-11-13 NOTE — Patient Outreach (Signed)
  Care Coordination   11/13/2022 Name: RAYQUAN AMRHEIN MRN: [id number] DOB: [birth year]   Care Coordination Outreach Attempts:  An unsuccessful telephone outreach was attempted for a scheduled appointment today.  Follow Up Plan:  No further outreach attempts will be made at this time. We have been unable to contact the patient to offer or enroll patient in care coordination services  Encounter Outcome:  No Answer   Care Coordination Interventions:  No, not indicated    Raina Mina, RN Care Management Coordinator [place] Office [phone]

## 2022-12-31 ENCOUNTER — Telehealth: Payer: Self-pay | Admitting: Physician Assistant

## 2022-12-31 NOTE — Telephone Encounter (Signed)
Spoke to pt asked him what lab work are you wanting? Pt said his yearly labs. Told pt you are not due till after May 18th for labs that is when your last physical was. Pt verbalized understanding and said he has new insurance plan. Told him then if you are able to have it now I would suggest you schedule an appointment so we can order labs accordingly. Pt verbalized understanding.

## 2022-12-31 NOTE — Telephone Encounter (Signed)
Pt states he need order for labs to be put in. Please advise.

## 2023-04-23 DIAGNOSIS — L910 Hypertrophic scar: Secondary | ICD-10-CM | POA: Diagnosis not present

## 2023-05-06 ENCOUNTER — Encounter: Payer: Self-pay | Admitting: Physician Assistant

## 2023-05-06 ENCOUNTER — Ambulatory Visit (INDEPENDENT_AMBULATORY_CARE_PROVIDER_SITE_OTHER): Payer: 59 | Admitting: Physician Assistant

## 2023-05-06 VITALS — BP 110/70 | HR 47 | Temp 97.5°F | Ht 70.0 in | Wt 203.4 lb

## 2023-05-06 DIAGNOSIS — S61411A Laceration without foreign body of right hand, initial encounter: Secondary | ICD-10-CM | POA: Diagnosis not present

## 2023-05-06 DIAGNOSIS — K51311 Ulcerative (chronic) rectosigmoiditis with rectal bleeding: Secondary | ICD-10-CM

## 2023-05-06 DIAGNOSIS — Z0000 Encounter for general adult medical examination without abnormal findings: Secondary | ICD-10-CM | POA: Diagnosis not present

## 2023-05-06 LAB — CBC WITH DIFFERENTIAL/PLATELET
Basophils Absolute: 0 10*3/uL (ref 0.0–0.1)
Basophils Relative: 0.8 % (ref 0.0–3.0)
Eosinophils Absolute: 0.1 10*3/uL (ref 0.0–0.7)
Eosinophils Relative: 1.6 % (ref 0.0–5.0)
HCT: 41.7 % (ref 39.0–52.0)
Hemoglobin: 14 g/dL (ref 13.0–17.0)
Lymphocytes Relative: 34.6 % (ref 12.0–46.0)
Lymphs Abs: 1.7 10*3/uL (ref 0.7–4.0)
MCHC: 33.4 g/dL (ref 30.0–36.0)
MCV: 93.9 fl (ref 78.0–100.0)
Monocytes Absolute: 0.4 10*3/uL (ref 0.1–1.0)
Monocytes Relative: 7.2 % (ref 3.0–12.0)
Neutro Abs: 2.8 10*3/uL (ref 1.4–7.7)
Neutrophils Relative %: 55.8 % (ref 43.0–77.0)
Platelets: 210 10*3/uL (ref 150.0–400.0)
RBC: 4.44 Mil/uL (ref 4.22–5.81)
RDW: 13.3 % (ref 11.5–15.5)
WBC: 5 10*3/uL (ref 4.0–10.5)

## 2023-05-06 LAB — COMPREHENSIVE METABOLIC PANEL
ALT: 39 U/L (ref 0–53)
AST: 30 U/L (ref 0–37)
Albumin: 4.3 g/dL (ref 3.5–5.2)
Alkaline Phosphatase: 96 U/L (ref 39–117)
BUN: 14 mg/dL (ref 6–23)
CO2: 28 mEq/L (ref 19–32)
Calcium: 9.5 mg/dL (ref 8.4–10.5)
Chloride: 104 mEq/L (ref 96–112)
Creatinine, Ser: 1.69 mg/dL — ABNORMAL HIGH (ref 0.40–1.50)
GFR: 52.91 mL/min — ABNORMAL LOW (ref >60.00)
Glucose, Bld: 89 mg/dL (ref 70–99)
Potassium: 4.6 mEq/L (ref 3.5–5.1)
Sodium: 140 mEq/L (ref 135–145)
Total Bilirubin: 0.8 mg/dL (ref 0.2–1.2)
Total Protein: 7 g/dL (ref 6.0–8.3)

## 2023-05-06 LAB — LIPID PANEL
Cholesterol: 211 mg/dL — ABNORMAL HIGH (ref 0–200)
HDL: 63.9 mg/dL (ref >39.00)
LDL Cholesterol: 132 mg/dL — ABNORMAL HIGH (ref 0–99)
NonHDL: 146.9
Total CHOL/HDL Ratio: 3
Triglycerides: 76 mg/dL (ref 0.0–149.0)
VLDL: 15.2 mg/dL (ref 0.0–40.0)

## 2023-05-06 LAB — SEDIMENTATION RATE: Sed Rate: 4 mm/hr (ref 0–15)

## 2023-05-06 MED ORDER — DOXYCYCLINE HYCLATE 100 MG PO TABS: 100.0000 mg | ORAL_TABLET | Freq: Two times a day (BID) | ORAL | 0 refills | Status: DC

## 2023-05-06 NOTE — Progress Notes (Signed)
Subjective:    Kenneth Terrell is a 32 y.o. male and is here for a comprehensive physical exam.  HPI  There are no preventive care reminders to display for this patient.  Acute Concerns: Right hand laceration Cut hand on a railroad tie yesterday Cleaned with hydrogen peroxide and neosporin UpToDate on tetanus Denies fevers, chills, nausea and vomiting and diarrhea  Chronic Issues: Ulcerative colitis Currently well controlled Denies any new/worsening sx  Health Maintenance: Immunizations -- UTD Colonoscopy -- 2017 PSA -- No results found for: "PSA1", "PSA" Diet -- healthy Sleep habits -- no concerns Exercise -- very active  Weight -- Weight: 203 lb 6.1 oz (92.3 kg)  Recent weight history Wt Readings from Last 10 Encounters:  05/06/23 203 lb 6.1 oz (92.3 kg)  04/23/22 206 lb (93.4 kg)  12/17/21 197 lb 3.2 oz (89.4 kg)  12/12/21 200 lb (90.7 kg)  12/11/21 198 lb 6.1 oz (90 kg)  12/01/21 204 lb (92.5 kg)  10/21/21 204 lb 6.1 oz (92.7 kg)  10/07/21 203 lb 3.2 oz (92.2 kg)  01/10/21 203 lb (92.1 kg)  01/04/21 200 lb (90.7 kg)   Body mass index is 29.18 kg/m.  Mood -- overall stable Alcohol use --  reports current alcohol use.  Tobacco use --  Tobacco Use: Low Risk  (05/06/2023)   Patient History    Smoking Tobacco Use: Never    Smokeless Tobacco Use: Never    Passive Exposure: Not on file    Eligible for Low Dose CT? No  UTD with eye doctor? yes UTD with dentist? yes     05/06/2023    9:18 AM  Depression screen PHQ 2/9  Decreased Interest 0  Down, Depressed, Hopeless 0  PHQ - 2 Score 0    Other providers/specialists: Patient Care Team: Jarold Motto, [place] as PCP - General (Physician Assistant)    PMHx, SurgHx, SocialHx, Medications, and Allergies were reviewed in the Visit Navigator and updated as appropriate.   Past Medical History:  Diagnosis Date   ADHD 01/11/2008   3 YOA    BIPOLAR DISORDER UNSPECIFIED 01/11/2008   actually thought  more depression - ?misdiagnosis   History of chlamydia 12/07/2006   treated   Ulcerative colitis ([facility]) 03/07/2016   by colonoscopy   Ulcerative proctitis [facility])      Past Surgical History:  Procedure Laterality Date   Charter Admission  12/07/1997   x 1 week   COLONOSCOPY  03/07/2016   Ulcerative colitis by biopsy ([place])   EXTERNAL EAR SURGERY     KELOID EXCISION  10/08/2007   left ear (Dr. Shon Hough)     Family History  Problem Relation Age of Onset   Drug abuse Father    Cancer Maternal Grandmother        lung cancer met. to brain   Depression Maternal Grandmother    Stroke Maternal Grandmother    Colon cancer Maternal Grandmother 52   Diabetes Maternal Uncle    CAD Other        MI; angina   Cancer Other        colon    Social History   Tobacco Use   Smoking status: Never   Smokeless tobacco: Never  Vaping Use   Vaping Use: Never used  Substance Use Topics   Alcohol use: Yes    Comment: rare/social   Drug use: No    Review of Systems:   Review of Systems  Constitutional:  Negative for chills, fever, malaise/fatigue  and weight loss.  HENT:  Negative for hearing loss, sinus pain and sore throat.   Respiratory:  Negative for cough and hemoptysis.   Cardiovascular:  Negative for chest pain, palpitations, leg swelling and PND.  Gastrointestinal:  Negative for abdominal pain, constipation, diarrhea, heartburn, nausea and vomiting.  Genitourinary:  Negative for dysuria, frequency and urgency.  Musculoskeletal:  Negative for back pain, myalgias and neck pain.  Skin:  Negative for itching and rash.  Neurological:  Negative for dizziness, tingling, seizures and headaches.  Endo/Heme/Allergies:  Negative for polydipsia.  Psychiatric/Behavioral:  Negative for depression. The patient is not nervous/anxious.     Objective:    Vitals:   05/06/23 0915  BP: 110/70  Pulse: (!) 47  Temp: (!) 97.5 F (36.4 C)  SpO2: 98%    Body mass index is 29.18  kg/m.  General  Alert, cooperative, no distress, appears stated age  Head:  Normocephalic, without obvious abnormality, atraumatic  Eyes:  PERRL, conjunctiva/corneas clear, EOM's intact, fundi benign, both eyes       Ears:  Normal TM's and external ear canals, both ears  Nose: Nares normal, septum midline, mucosa normal, no drainage or sinus tenderness  Throat: Lips, mucosa, and tongue normal; teeth and gums normal  Neck: Supple, symmetrical, trachea midline, no adenopathy;     thyroid:  No enlargement/tenderness/nodules; no carotid bruit or JVD  Back:   Symmetric, no curvature, ROM normal, no CVA tenderness  Lungs:   Clear to auscultation bilaterally, respirations unlabored  Chest wall:  No tenderness or deformity  Heart:  Regular rate and rhythm, S1 and S2 normal, no murmur, rub or gallop  Abdomen:   Soft, non-tender, bowel sounds active all four quadrants, no masses, no organomegaly  Extremities: Extremities normal, atraumatic, no cyanosis or edema  Prostate : Deferred   Skin: Skin color, texture, turgor normal, no rashes or lesions  Lymph nodes: Cervical, supraclavicular, and axillary nodes normal  Neurologic: CNII-XII grossly intact. Normal strength, sensation and reflexes throughout   AssessmentPlan:   Routine physical examination Today patient counseled on age appropriate routine health concerns for screening and prevention, each reviewed and up to date or declined. Immunizations reviewed and up to date or declined. Labs ordered and reviewed. Risk factors for depression reviewed and negative. Hearing function and visual acuity are intact. ADLs screened and addressed as needed. Functional ability and level of safety reviewed and appropriate. Education, counseling and referrals performed based on assessed risks today. Patient provided with a copy of personalized plan for preventive services.  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

## 2023-05-06 NOTE — Patient Instructions (Signed)
It was great to see you!  Keep hand covered with ointment Use regular soap and water to wash 1-2 times daily Gloves when working Earplugs when working  Please go to the lab for blood work.   Our office will call you with your results unless you have chosen to receive results via MyChart.  If your blood work is normal we will follow-up each year for physicals and as scheduled for chronic medical problems.  If anything is abnormal we will treat accordingly and get you in for a follow-up.  Take care,  Lelon Mast

## 2023-05-07 ENCOUNTER — Other Ambulatory Visit: Payer: Self-pay | Admitting: Physician Assistant

## 2023-05-07 DIAGNOSIS — R944 Abnormal results of kidney function studies: Secondary | ICD-10-CM

## 2023-06-16 ENCOUNTER — Encounter: Payer: Self-pay | Admitting: Physician Assistant

## 2023-06-16 DIAGNOSIS — Z3141 Encounter for fertility testing: Secondary | ICD-10-CM

## 2023-06-17 ENCOUNTER — Other Ambulatory Visit: Payer: Self-pay | Admitting: Physician Assistant

## 2023-06-17 ENCOUNTER — Other Ambulatory Visit (INDEPENDENT_AMBULATORY_CARE_PROVIDER_SITE_OTHER): Payer: 59

## 2023-06-17 DIAGNOSIS — R944 Abnormal results of kidney function studies: Secondary | ICD-10-CM

## 2023-06-17 LAB — COMPREHENSIVE METABOLIC PANEL
ALT: 26 U/L (ref 0–53)
AST: 32 U/L (ref 0–37)
Albumin: 4.6 g/dL (ref 3.5–5.2)
Alkaline Phosphatase: 70 U/L (ref 39–117)
BUN: 17 mg/dL (ref 6–23)
CO2: 27 mEq/L (ref 19–32)
Calcium: 9.9 mg/dL (ref 8.4–10.5)
Chloride: 100 mEq/L (ref 96–112)
Creatinine, Ser: 1.69 mg/dL — ABNORMAL HIGH (ref 0.40–1.50)
GFR: 52.87 mL/min — ABNORMAL LOW (ref >60.00)
Glucose, Bld: 84 mg/dL (ref 70–99)
Potassium: 4.5 mEq/L (ref 3.5–5.1)
Sodium: 133 mEq/L — ABNORMAL LOW (ref 135–145)
Total Bilirubin: 1.1 mg/dL (ref 0.2–1.2)
Total Protein: 7.2 g/dL (ref 6.0–8.3)

## 2023-06-18 DIAGNOSIS — L910 Hypertrophic scar: Secondary | ICD-10-CM | POA: Diagnosis not present

## 2023-07-05 DIAGNOSIS — Z713 Dietary counseling and surveillance: Secondary | ICD-10-CM | POA: Diagnosis not present

## 2023-07-05 DIAGNOSIS — K922 Gastrointestinal hemorrhage, unspecified: Secondary | ICD-10-CM | POA: Diagnosis not present

## 2023-07-05 DIAGNOSIS — Z724 Inappropriate diet and eating habits: Secondary | ICD-10-CM | POA: Diagnosis not present

## 2023-07-05 DIAGNOSIS — D500 Iron deficiency anemia secondary to blood loss (chronic): Secondary | ICD-10-CM | POA: Diagnosis not present

## 2023-07-06 DIAGNOSIS — D500 Iron deficiency anemia secondary to blood loss (chronic): Secondary | ICD-10-CM | POA: Diagnosis not present

## 2023-07-06 DIAGNOSIS — K922 Gastrointestinal hemorrhage, unspecified: Secondary | ICD-10-CM | POA: Diagnosis not present

## 2023-07-06 DIAGNOSIS — Z713 Dietary counseling and surveillance: Secondary | ICD-10-CM | POA: Diagnosis not present

## 2023-07-06 DIAGNOSIS — Z724 Inappropriate diet and eating habits: Secondary | ICD-10-CM | POA: Diagnosis not present

## 2023-07-07 ENCOUNTER — Other Ambulatory Visit: Payer: 59

## 2023-07-08 ENCOUNTER — Ambulatory Visit (INDEPENDENT_AMBULATORY_CARE_PROVIDER_SITE_OTHER): Payer: 59 | Admitting: Physician Assistant

## 2023-07-08 ENCOUNTER — Encounter: Payer: Self-pay | Admitting: Physician Assistant

## 2023-07-08 VITALS — BP 110/80 | HR 52 | Temp 98.0°F | Ht 70.0 in | Wt 197.2 lb

## 2023-07-08 DIAGNOSIS — Z136 Encounter for screening for cardiovascular disorders: Secondary | ICD-10-CM | POA: Diagnosis not present

## 2023-07-08 DIAGNOSIS — Z13220 Encounter for screening for lipoid disorders: Secondary | ICD-10-CM

## 2023-07-08 DIAGNOSIS — R944 Abnormal results of kidney function studies: Secondary | ICD-10-CM | POA: Diagnosis not present

## 2023-07-08 LAB — URINALYSIS, ROUTINE W REFLEX MICROSCOPIC
Bilirubin Urine: NEGATIVE
Hgb urine dipstick: NEGATIVE
Ketones, ur: NEGATIVE
Leukocytes,Ua: NEGATIVE
Nitrite: NEGATIVE
RBC / HPF: NONE SEEN (ref 0–?)
Specific Gravity, Urine: 1.01 (ref 1.000–1.030)
Total Protein, Urine: NEGATIVE
Urine Glucose: NEGATIVE
Urobilinogen, UA: 0.2 (ref 0.0–1.0)
WBC, UA: NONE SEEN (ref 0–?)
pH: 7 (ref 5.0–8.0)

## 2023-07-08 LAB — COMPREHENSIVE METABOLIC PANEL
ALT: 22 U/L (ref 0–53)
AST: 27 U/L (ref 0–37)
Albumin: 4.2 g/dL (ref 3.5–5.2)
Alkaline Phosphatase: 75 U/L (ref 39–117)
BUN: 11 mg/dL (ref 6–23)
CO2: 29 mEq/L (ref 19–32)
Calcium: 9.6 mg/dL (ref 8.4–10.5)
Chloride: 101 mEq/L (ref 96–112)
Creatinine, Ser: 1.46 mg/dL (ref 0.40–1.50)
GFR: 62.99 mL/min (ref >60.00)
Glucose, Bld: 93 mg/dL (ref 70–99)
Potassium: 4.2 mEq/L (ref 3.5–5.1)
Sodium: 136 mEq/L (ref 135–145)
Total Bilirubin: 0.8 mg/dL (ref 0.2–1.2)
Total Protein: 7.2 g/dL (ref 6.0–8.3)

## 2023-07-08 LAB — LIPID PANEL
Cholesterol: 163 mg/dL (ref 0–200)
HDL: 48.9 mg/dL (ref >39.00)
LDL Cholesterol: 101 mg/dL — ABNORMAL HIGH (ref 0–99)
NonHDL: 114.07
Total CHOL/HDL Ratio: 3
Triglycerides: 67 mg/dL (ref 0.0–149.0)
VLDL: 13.4 mg/dL (ref 0.0–40.0)

## 2023-07-08 NOTE — Progress Notes (Signed)
Kenneth Terrell is a 32 y.o. male here for a follow up of a pre-existing problem.  History of Present Illness:   Chief Complaint  Patient presents with   Kidney function    Pt is here to f/u on Kidney function.   HPI   Decreased GFR Presented to redo labs to recheck kidney function after CMP resulted decreased GFR. Reports he has reduced sugar intake, eating plenty of fruits and vegetables.  Hydrating well.   He denies any nausea, vomiting, diarrhea, unusual back pain or supplements  HLD Patient requesting lipid panel to be checked      Past Medical History:  Diagnosis Date   ADHD 01/11/2008   3 YOA    BIPOLAR DISORDER UNSPECIFIED 01/11/2008   actually thought more depression - ?misdiagnosis   History of chlamydia 12/07/2006   treated   Ulcerative colitis (HCC) 03/07/2016   by colonoscopy   Ulcerative proctitis (HCC)      Social History   Tobacco Use   Smoking status: Never   Smokeless tobacco: Never  Vaping Use   Vaping status: Never Used  Substance Use Topics   Alcohol use: Yes    Alcohol/week: 1.0 standard drink of alcohol    Types: 1 Glasses of wine per week    Comment: rare/social   Drug use: No    Past Surgical History:  Procedure Laterality Date   Charter Admission  12/07/1997   x 1 week   COLONOSCOPY  03/07/2016   Ulcerative colitis by biopsy ([facility])   EXTERNAL EAR SURGERY     KELOID EXCISION  10/08/2007   left ear (Dr. Shon Hough)    Family History  Problem Relation Age of Onset   Drug abuse Father    Cancer Maternal Grandmother        lung cancer met. to brain   Depression Maternal Grandmother    Stroke Maternal Grandmother    Colon cancer Maternal Grandmother 60   Diabetes Maternal Uncle    CAD Other        MI; angina   Cancer Other        colon    No Known Allergies  Current Medications:  No current outpatient medications on file.   Review of Systems:   ROS Negative unless otherwise specified per HPI.  Vitals:    Vitals:   07/08/23 0812  BP: 110/80  Pulse: (!) 52  Temp: 98 F (36.7 C)  TempSrc: Temporal  SpO2: 98%  Weight: 197 lb 4 oz (89.5 kg)  Height: 5\' 10"  (1.778 m)     Body mass index is 28.3 kg/m.  Physical Exam:   Physical Exam Vitals and nursing note reviewed.  Constitutional:      Appearance: He is well-developed.  HENT:     Head: Normocephalic.  Eyes:     Conjunctiva/sclera: Conjunctivae normal.     Pupils: Pupils are equal, round, and reactive to light.  Pulmonary:     Effort: Pulmonary effort is normal.  Musculoskeletal:        General: Normal range of motion.     Cervical back: Normal range of motion.  Skin:    General: Skin is warm and dry.  Neurological:     Mental Status: He is alert and oriented to person, place, and time.  Psychiatric:        Behavior: Behavior normal.        Thought Content: Thought content normal.        Judgment: Judgment normal.  Assessment and Plan:   Decreased GFR Update blood work and urinalysis today Will guide further based on results  Encounter for lipid screening for cardiovascular disease Update lipid panel and provide recommendations accordingly   I,Emily Lagle,acting as a scribe for [employer], PA.,have documented all relevant documentation on the behalf of Jarold Motto, PA,as directed by  Jarold Motto, PA while in the presence of Jarold Motto, [place].  I, Jarold Motto, [place], have reviewed all documentation for this visit. The documentation on 07/08/23 for the exam, diagnosis, procedures, and orders are all accurate and complete.   Jarold Motto, PA-C

## 2023-07-13 DIAGNOSIS — K922 Gastrointestinal hemorrhage, unspecified: Secondary | ICD-10-CM | POA: Diagnosis not present

## 2023-07-13 DIAGNOSIS — Z713 Dietary counseling and surveillance: Secondary | ICD-10-CM | POA: Diagnosis not present

## 2023-07-13 DIAGNOSIS — D500 Iron deficiency anemia secondary to blood loss (chronic): Secondary | ICD-10-CM | POA: Diagnosis not present

## 2023-07-13 DIAGNOSIS — Z724 Inappropriate diet and eating habits: Secondary | ICD-10-CM | POA: Diagnosis not present

## 2023-07-19 NOTE — Telephone Encounter (Signed)
Please see message and advise referral?

## 2023-10-25 DIAGNOSIS — F4320 Adjustment disorder, unspecified: Secondary | ICD-10-CM | POA: Diagnosis not present

## 2023-10-25 DIAGNOSIS — F909 Attention-deficit hyperactivity disorder, unspecified type: Secondary | ICD-10-CM | POA: Diagnosis not present

## 2023-11-01 DIAGNOSIS — F4322 Adjustment disorder with anxiety: Secondary | ICD-10-CM | POA: Diagnosis not present

## 2023-11-01 DIAGNOSIS — F902 Attention-deficit hyperactivity disorder, combined type: Secondary | ICD-10-CM | POA: Diagnosis not present

## 2023-11-22 DIAGNOSIS — F4322 Adjustment disorder with anxiety: Secondary | ICD-10-CM | POA: Diagnosis not present

## 2023-11-22 DIAGNOSIS — F902 Attention-deficit hyperactivity disorder, combined type: Secondary | ICD-10-CM | POA: Diagnosis not present

## 2023-12-14 ENCOUNTER — Encounter: Payer: Self-pay | Admitting: Physician Assistant

## 2023-12-16 ENCOUNTER — Ambulatory Visit: Payer: 59 | Admitting: Physician Assistant

## 2023-12-20 ENCOUNTER — Ambulatory Visit: Payer: 59 | Admitting: Physician Assistant

## 2023-12-20 ENCOUNTER — Ambulatory Visit: Payer: 59 | Admitting: Family

## 2023-12-20 NOTE — Progress Notes (Deleted)
   Patient ID: Kenneth Terrell, male    DOB: 09-26-1991, 33 y.o.   MRN: [id number]  No chief complaint on file.           Assessment & Plan:   Subjective:    No outpatient medications prior to visit.   No facility-administered medications prior to visit.   Past Medical History:  Diagnosis Date   ADHD 01/11/2008   3 YOA    BIPOLAR DISORDER UNSPECIFIED 01/11/2008   actually thought more depression - ?misdiagnosis   History of chlamydia 12/07/2006   treated   Ulcerative colitis (HCC) 03/07/2016   by colonoscopy   Ulcerative proctitis [facility])    Past Surgical History:  Procedure Laterality Date   Charter Admission  12/07/1997   x 1 week   COLONOSCOPY  03/07/2016   Ulcerative colitis by biopsy ([facility])   EXTERNAL EAR SURGERY     KELOID EXCISION  10/08/2007   left ear (Dr. Marcus)   No Known Allergies    Objective:    Physical Exam Vitals and nursing note reviewed.  Constitutional:      General: He is not in acute distress.    Appearance: Normal appearance.  HENT:     Head: Normocephalic.  Cardiovascular:     Rate and Rhythm: Normal rate and regular rhythm.  Pulmonary:     Effort: Pulmonary effort is normal.     Breath sounds: Normal breath sounds.  Musculoskeletal:        General: Normal range of motion.     Cervical back: Normal range of motion.  Skin:    General: Skin is warm and dry.  Neurological:     Mental Status: He is alert and oriented to person, place, and time.  Psychiatric:        Mood and Affect: Mood normal.    There were no vitals taken for this visit. Wt Readings from Last 3 Encounters:  07/08/23 197 lb 4 oz (89.5 kg)  05/06/23 203 lb 6.1 oz (92.3 kg)  04/23/22 206 lb (93.4 kg)       Lucius Krabbe, NP

## 2023-12-21 ENCOUNTER — Encounter: Payer: Self-pay | Admitting: Physician Assistant

## 2023-12-21 ENCOUNTER — Telehealth (INDEPENDENT_AMBULATORY_CARE_PROVIDER_SITE_OTHER): Payer: 59 | Admitting: Physician Assistant

## 2023-12-21 VITALS — Ht 70.0 in | Wt 210.0 lb

## 2023-12-21 DIAGNOSIS — R5383 Other fatigue: Secondary | ICD-10-CM

## 2023-12-21 DIAGNOSIS — E559 Vitamin D deficiency, unspecified: Secondary | ICD-10-CM

## 2023-12-21 DIAGNOSIS — R350 Frequency of micturition: Secondary | ICD-10-CM | POA: Diagnosis not present

## 2023-12-21 NOTE — Progress Notes (Signed)
   Virtual Visit via Video Note   I, Lucie Buttner, Pa, connected with  Kenneth Terrell  ([id number], January 05, 1991) on 12/21/23 at 11:20 AM EST by a video-enabled telemedicine application and verified that I am speaking with the correct person using two identifiers.  Location: Patient: Home Provider: [facility] office   I discussed the limitations of evaluation and management by telemedicine and the availability of in person appointments. The patient expressed understanding and agreed to proceed.    History of Present Illness: Kenneth Terrell is a 33 y.o. who identifies as a male who was assigned male at birth, and is being seen today for fatigue. Pt c/o feeling very tired since December, would like blood work done and check kidney function. He reports that he has frequent urination and noticeable froth in urine. Denies pain with urination.  Denies changes to routine but does admit to increase in stress. He is having a baby in a few months. Denies weight changes, night sweats, recent ulcerative colitis flares.  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  Observations/Objective: Patient is well-developed, well-nourished in no acute distress.  Resting comfortably  at home.  Head is normocephalic, atraumatic.  No labored breathing.  Speech is clear and coherent with logical content.  Patient is alert and oriented at baseline.   Assessment and Plan: 1. Fatigue, unspecified type (Primary) - CBC with Differential/Platelet; Future - Comprehensive metabolic panel; Future - IBC +  Ferritin; Future - TSH; Future - Vitamin B12; Future - Urinalysis, Routine w reflex microscopic; Future  Unclear etiology Will update blood work and UA to rule out organic cause of symptom(s)  Continue efforts at stress management and healthy lifestyle Consider additional blood work if symptom(s) persist or new changes  2. Vitamin D  deficiency - VITAMIN D  25 Hydroxy (Vit-D Deficiency, Fractures); Future  3. Frequent urination - Urine Culture; Future Will update urinalysis and culture for further evaluation Continue efforts to maintain good hydration status Declines sexual transmitted infection testing  Follow Up Instructions: I discussed the assessment and treatment plan with the patient. The patient was provided an opportunity to ask questions and all were answered. The patient agreed with the plan and demonstrated an understanding of the instructions.  A copy of instructions were sent to the patient via MyChart unless otherwise noted below.   The patient was advised to call back or seek an in-person evaluation if the symptoms worsen or if the condition fails to improve as anticipated.  Lucie Buttner, [place]

## 2023-12-21 NOTE — Patient Instructions (Addendum)
 Froth in urine Frequent urine and protein No changes in eating, sleeping, exercising Stress is up  No flares -- no blood in stool

## 2023-12-22 ENCOUNTER — Other Ambulatory Visit (INDEPENDENT_AMBULATORY_CARE_PROVIDER_SITE_OTHER): Payer: 59

## 2023-12-22 DIAGNOSIS — E559 Vitamin D deficiency, unspecified: Secondary | ICD-10-CM

## 2023-12-22 DIAGNOSIS — R5383 Other fatigue: Secondary | ICD-10-CM

## 2023-12-22 DIAGNOSIS — R350 Frequency of micturition: Secondary | ICD-10-CM | POA: Diagnosis not present

## 2023-12-22 DIAGNOSIS — E538 Deficiency of other specified B group vitamins: Secondary | ICD-10-CM | POA: Insufficient documentation

## 2023-12-22 LAB — URINALYSIS, ROUTINE W REFLEX MICROSCOPIC
Bilirubin Urine: NEGATIVE
Hgb urine dipstick: NEGATIVE
Ketones, ur: NEGATIVE
Leukocytes,Ua: NEGATIVE
Nitrite: NEGATIVE
RBC / HPF: NONE SEEN (ref 0–?)
Specific Gravity, Urine: 1.015 (ref 1.000–1.030)
Total Protein, Urine: NEGATIVE
Urine Glucose: NEGATIVE
Urobilinogen, UA: 0.2 (ref 0.0–1.0)
pH: 8 (ref 5.0–8.0)

## 2023-12-22 LAB — CBC WITH DIFFERENTIAL/PLATELET
Basophils Absolute: 0.1 10*3/uL (ref 0.0–0.1)
Basophils Relative: 1 % (ref 0.0–3.0)
Eosinophils Absolute: 0.1 10*3/uL (ref 0.0–0.7)
Eosinophils Relative: 1.1 % (ref 0.0–5.0)
HCT: 41.7 % (ref 39.0–52.0)
Hemoglobin: 14.1 g/dL (ref 13.0–17.0)
Lymphocytes Relative: 37.2 % (ref 12.0–46.0)
Lymphs Abs: 2.1 10*3/uL (ref 0.7–4.0)
MCHC: 33.9 g/dL (ref 30.0–36.0)
MCV: 92.8 fL (ref 78.0–100.0)
Monocytes Absolute: 0.4 10*3/uL (ref 0.1–1.0)
Monocytes Relative: 7.1 % (ref 3.0–12.0)
Neutro Abs: 3 10*3/uL (ref 1.4–7.7)
Neutrophils Relative %: 53.6 % (ref 43.0–77.0)
Platelets: 201 10*3/uL (ref 150.0–400.0)
RBC: 4.5 Mil/uL (ref 4.22–5.81)
RDW: 12.5 % (ref 11.5–15.5)
WBC: 5.5 10*3/uL (ref 4.0–10.5)

## 2023-12-22 LAB — COMPREHENSIVE METABOLIC PANEL
ALT: 37 U/L (ref 0–53)
AST: 30 U/L (ref 0–37)
Albumin: 4.5 g/dL (ref 3.5–5.2)
Alkaline Phosphatase: 80 U/L (ref 39–117)
BUN: 13 mg/dL (ref 6–23)
CO2: 31 meq/L (ref 19–32)
Calcium: 9.6 mg/dL (ref 8.4–10.5)
Chloride: 104 meq/L (ref 96–112)
Creatinine, Ser: 1.5 mg/dL (ref 0.40–1.50)
GFR: 60.78 mL/min (ref >60.00)
Glucose, Bld: 90 mg/dL (ref 70–99)
Potassium: 4.4 meq/L (ref 3.5–5.1)
Sodium: 139 meq/L (ref 135–145)
Total Bilirubin: 1.1 mg/dL (ref 0.2–1.2)
Total Protein: 7 g/dL (ref 6.0–8.3)

## 2023-12-22 LAB — IBC + FERRITIN
Ferritin: 29.1 ng/mL (ref 22.0–322.0)
Iron: 181 ug/dL — ABNORMAL HIGH (ref 42–165)
Saturation Ratios: 55.7 % — ABNORMAL HIGH (ref 20.0–50.0)
TIBC: 324.8 ug/dL (ref 250.0–450.0)
Transferrin: 232 mg/dL (ref 212.0–360.0)

## 2023-12-22 LAB — VITAMIN D 25 HYDROXY (VIT D DEFICIENCY, FRACTURES): VITD: 18.24 ng/mL — ABNORMAL LOW (ref 30.00–100.00)

## 2023-12-22 LAB — TSH: TSH: 1.93 u[IU]/mL (ref 0.35–5.50)

## 2023-12-22 LAB — VITAMIN B12: Vitamin B-12: 234 pg/mL (ref 211–911)

## 2023-12-23 ENCOUNTER — Other Ambulatory Visit: Payer: Self-pay | Admitting: Physician Assistant

## 2023-12-23 LAB — URINE CULTURE
MICRO NUMBER:: 15958907
Result:: NO GROWTH
SPECIMEN QUALITY:: ADEQUATE

## 2023-12-23 MED ORDER — VITAMIN D (ERGOCALCIFEROL) 1.25 MG (50000 UNIT) PO CAPS: 50000.0000 [IU] | ORAL_CAPSULE | ORAL | 0 refills | Status: DC

## 2024-01-01 ENCOUNTER — Encounter: Payer: Self-pay | Admitting: Physician Assistant

## 2024-01-03 NOTE — Telephone Encounter (Signed)
Hasna, can you answer patients question about B12, see message.

## 2024-01-04 ENCOUNTER — Ambulatory Visit: Payer: 59 | Admitting: *Deleted

## 2024-01-04 DIAGNOSIS — F4322 Adjustment disorder with anxiety: Secondary | ICD-10-CM | POA: Diagnosis not present

## 2024-01-04 DIAGNOSIS — F902 Attention-deficit hyperactivity disorder, combined type: Secondary | ICD-10-CM | POA: Diagnosis not present

## 2024-01-18 ENCOUNTER — Encounter: Payer: Self-pay | Admitting: *Deleted

## 2024-01-18 ENCOUNTER — Ambulatory Visit (INDEPENDENT_AMBULATORY_CARE_PROVIDER_SITE_OTHER): Payer: 59 | Admitting: *Deleted

## 2024-01-18 DIAGNOSIS — E538 Deficiency of other specified B group vitamins: Secondary | ICD-10-CM

## 2024-01-18 MED ORDER — CYANOCOBALAMIN 1000 MCG/ML IJ SOLN
1000.0000 ug | Freq: Once | INTRAMUSCULAR | Status: AC
  Administered 2024-01-18: 1000 ug via INTRAMUSCULAR

## 2024-01-18 NOTE — Progress Notes (Signed)
Per orders of Jarold Motto, [place], injection of Cyanocobalamin 1000 mcg given IM by Corky Mull, LPN in left deltoid. Patient tolerated injection well. Patient will make appointment for 1 week.

## 2024-01-25 ENCOUNTER — Encounter: Payer: Self-pay | Admitting: *Deleted

## 2024-01-25 ENCOUNTER — Ambulatory Visit (INDEPENDENT_AMBULATORY_CARE_PROVIDER_SITE_OTHER): Payer: 59 | Admitting: *Deleted

## 2024-01-25 DIAGNOSIS — E538 Deficiency of other specified B group vitamins: Secondary | ICD-10-CM | POA: Diagnosis not present

## 2024-01-25 MED ORDER — CYANOCOBALAMIN 1000 MCG/ML IJ SOLN
1000.0000 ug | Freq: Once | INTRAMUSCULAR | Status: AC
  Administered 2024-01-25: 1000 ug via INTRAMUSCULAR

## 2024-01-25 NOTE — Progress Notes (Signed)
Per orders of Jarold Motto, [place], injection of Cyanocobalamin 1000 mcg given IM by Corky Mull, LPN in right deltoid. Patient tolerated injection well. Patient will make appointment for 1 week.

## 2024-02-01 ENCOUNTER — Ambulatory Visit: Payer: 59

## 2024-02-01 DIAGNOSIS — F902 Attention-deficit hyperactivity disorder, combined type: Secondary | ICD-10-CM | POA: Diagnosis not present

## 2024-02-01 DIAGNOSIS — F4322 Adjustment disorder with anxiety: Secondary | ICD-10-CM | POA: Diagnosis not present

## 2024-02-08 DIAGNOSIS — L910 Hypertrophic scar: Secondary | ICD-10-CM | POA: Diagnosis not present

## 2024-03-15 DIAGNOSIS — L910 Hypertrophic scar: Secondary | ICD-10-CM | POA: Diagnosis not present

## 2024-03-25 ENCOUNTER — Encounter: Payer: Self-pay | Admitting: Physician Assistant

## 2024-03-26 ENCOUNTER — Other Ambulatory Visit: Payer: Self-pay | Admitting: Physician Assistant

## 2024-03-26 DIAGNOSIS — E559 Vitamin D deficiency, unspecified: Secondary | ICD-10-CM

## 2024-03-26 DIAGNOSIS — E785 Hyperlipidemia, unspecified: Secondary | ICD-10-CM

## 2024-03-26 DIAGNOSIS — E538 Deficiency of other specified B group vitamins: Secondary | ICD-10-CM

## 2024-03-26 DIAGNOSIS — R944 Abnormal results of kidney function studies: Secondary | ICD-10-CM

## 2024-04-20 DIAGNOSIS — L910 Hypertrophic scar: Secondary | ICD-10-CM | POA: Diagnosis not present

## 2024-06-06 DIAGNOSIS — L910 Hypertrophic scar: Secondary | ICD-10-CM | POA: Diagnosis not present

## 2024-06-14 DIAGNOSIS — F902 Attention-deficit hyperactivity disorder, combined type: Secondary | ICD-10-CM | POA: Diagnosis not present

## 2024-06-14 DIAGNOSIS — F4322 Adjustment disorder with anxiety: Secondary | ICD-10-CM | POA: Diagnosis not present

## 2024-06-19 ENCOUNTER — Ambulatory Visit (INDEPENDENT_AMBULATORY_CARE_PROVIDER_SITE_OTHER): Admitting: Physician Assistant

## 2024-06-19 ENCOUNTER — Encounter: Payer: Self-pay | Admitting: Physician Assistant

## 2024-06-19 VITALS — BP 108/70 | HR 55 | Temp 98.1°F | Ht 70.0 in | Wt 205.6 lb

## 2024-06-19 DIAGNOSIS — R229 Localized swelling, mass and lump, unspecified: Secondary | ICD-10-CM | POA: Diagnosis not present

## 2024-06-19 DIAGNOSIS — Z0000 Encounter for general adult medical examination without abnormal findings: Secondary | ICD-10-CM

## 2024-06-19 DIAGNOSIS — E559 Vitamin D deficiency, unspecified: Secondary | ICD-10-CM | POA: Diagnosis not present

## 2024-06-19 DIAGNOSIS — E785 Hyperlipidemia, unspecified: Secondary | ICD-10-CM | POA: Diagnosis not present

## 2024-06-19 DIAGNOSIS — K51311 Ulcerative (chronic) rectosigmoiditis with rectal bleeding: Secondary | ICD-10-CM

## 2024-06-19 DIAGNOSIS — E538 Deficiency of other specified B group vitamins: Secondary | ICD-10-CM | POA: Diagnosis not present

## 2024-06-19 NOTE — Patient Instructions (Signed)
 It was great to see you!  Please go to the lab for blood work.   Our office will call you with your results unless you have chosen to receive results via MyChart.  If your blood work is normal we will follow-up each year for physicals and as scheduled for chronic medical problems.  If anything is abnormal we will treat accordingly and get you in for a follow-up.  Take care,  Lelon Mast

## 2024-06-19 NOTE — Progress Notes (Signed)
 Subjective:    Kenneth Terrell is a 33 y.o. male and is here for a comprehensive physical exam.  HPI  Health Maintenance Due  Topic Date Due   Hepatitis B Vaccines (1 of 3 - 19+ 3-dose series) Never done   HPV VACCINES (1 - 3-dose SCDM series) Never done    Acute Concerns: No acute concerns reported today.  Chronic Issues: Cyst Pt complains of a cyst that he has had for a while.  He reports it has enlarged over time and associated headaches. An ultrasound was perforrmed on 12/2021 and results showed an underlying scalp thickening and suggested either a sebaceous cyst or lipoma. He notes his skin is susceptible to keloids  Ulcerative colitis  Currently well controlled without medication  Health Maintenance: Immunizations -- See above. Colonoscopy -- Last done 03/23/2016, dx Ulcerative Colitis. PSA -- No results found for: PSA1, PSA Diet -- Eats primarily plant-based diet and chicken; orders liquid form of soursop online after seeing an herbalist  Sleep habits -- Good sleeping habits. Exercise -- Daily exercise.  Weight --  Recent weight history Wt Readings from Last 10 Encounters:  06/19/24 205 lb 9.6 oz (93.3 kg)  12/21/23 210 lb (95.3 kg)  07/08/23 197 lb 4 oz (89.5 kg)  05/06/23 203 lb 6.1 oz (92.3 kg)  04/23/22 206 lb (93.4 kg)  12/17/21 197 lb 3.2 oz (89.4 kg)  12/12/21 200 lb (90.7 kg)  12/11/21 198 lb 6.1 oz (90 kg)  12/01/21 204 lb (92.5 kg)  10/21/21 204 lb 6.1 oz (92.7 kg)   Body mass index is 29.5 kg/m.  Mood -- Stable. Alcohol  use --  reports current alcohol  use of about 1.0 standard drink of alcohol  per week.  Tobacco use --  Tobacco Use: Low Risk  (06/19/2024)   Patient History    Smoking Tobacco Use: Never    Smokeless Tobacco Use: Never    Passive Exposure: Not on file    Eligible for Low Dose CT? no  UTD with eye doctor? Needs to see one soon UTD with dentist? 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   Past Medical History:  Diagnosis Date   ADHD 01/11/2008   3 YOA    BIPOLAR DISORDER UNSPECIFIED 01/11/2008   actually thought more depression - ?misdiagnosis   History of chlamydia 12/07/2006   treated   Ulcerative colitis (HCC) 03/07/2016   by colonoscopy   Ulcerative proctitis [facility])      Past Surgical History:  Procedure Laterality Date   Charter Admission  12/07/1997   x 1 week   COLONOSCOPY  03/07/2016   Ulcerative colitis by biopsy ([facility])   EXTERNAL EAR SURGERY     KELOID EXCISION  10/08/2007   left ear (Dr. Marcus)     Family History  Problem Relation Age of Onset   Drug abuse Father    Cancer Maternal Grandmother        lung cancer met. to brain   Depression Maternal Grandmother    Stroke Maternal Grandmother    Colon cancer Maternal Grandmother 60   Diabetes Maternal Uncle    CAD  Other        MI; angina   Cancer Other        colon    Social History   Tobacco Use   Smoking status: Never   Smokeless tobacco: Never  Vaping Use   Vaping status: Never Used  Substance Use Topics   Alcohol  use: Yes    Alcohol /week: 1.0 standard drink of alcohol     Types: 1 Glasses of wine per week    Comment: rare/social   Drug use: No    Review of Systems:   Review of Systems  Constitutional:  Negative for chills, fever, malaise/fatigue and weight loss.  HENT:  Negative for hearing loss, sinus pain and sore throat.   Respiratory:  Negative for cough and hemoptysis.   Cardiovascular:  Negative for chest pain, palpitations, leg swelling and PND.  Gastrointestinal:  Negative for abdominal pain, constipation, diarrhea, heartburn, nausea and vomiting.   Genitourinary:  Negative for dysuria, frequency and urgency.  Musculoskeletal:  Negative for back pain, myalgias and neck pain.  Skin:  Negative for itching and rash.  Neurological:  Negative for dizziness, tingling, seizures and headaches.  Endo/Heme/Allergies:  Negative for polydipsia.  Psychiatric/Behavioral:  Negative for depression. The patient is not nervous/anxious.       Objective:    Vitals:   06/19/24 1106  BP: 108/70  Pulse: (!) 55  Temp: 98.1 F (36.7 C)  SpO2: 98%    Body mass index is 29.5 kg/m.  General  Alert, cooperative, no distress, appears stated age  Head:  Normocephalic, without obvious abnormality, atraumatic  Eyes:  PERRL, conjunctiva/corneas clear, EOM's intact, fundi benign, both eyes       Ears:  Normal TM's and external ear canals, both ears  Nose: Nares normal, septum midline, mucosa normal, no drainage or sinus tenderness  Throat: Lips, mucosa, and tongue normal; teeth and gums normal  Neck: Supple, symmetrical, trachea midline, no adenopathy;     thyroid :  No enlargement/tenderness/nodules; no carotid bruit or JVD  Back:   Symmetric, no curvature, ROM normal, no CVA tenderness  Lungs:   Clear to auscultation bilaterally, respirations unlabored  Chest wall:  No tenderness or deformity  Heart:  Regular rate and rhythm, S1 and S2 normal, no murmur, rub or gallop  Abdomen:   Soft, non-tender, bowel sounds active all four quadrants, no masses, no organomegaly  Extremities: Extremities normal, atraumatic, no cyanosis or edema  Prostate : Deferred  Skin: Skin color, texture, turgor normal, no rashes Fluctuant mass to right forehead  Lymph nodes: Cervical, supraclavicular, and axillary nodes normal  Neurologic: CNII-XII grossly intact. Normal strength, sensation and reflexes throughout   AssessmentPlan:   Routine physical examination Today patient counseled on age appropriate routine health concerns for screening and prevention, each reviewed  and up to date or declined. Immunizations reviewed and up to date or declined. Labs ordered and reviewed. Risk factors for depression reviewed and negative. Hearing function and visual acuity are intact. ADLs screened and addressed as needed. Functional ability and level of safety reviewed and appropriate. Education, counseling and referrals performed based on assessed risks today. Patient provided with a copy of personalized plan for preventive services.  Localized skin mass, lump, or swelling Reviewed recent imaging We considered updating CT scan vs referral to plastics -- we decided to go ahead and refer to plastics and will defer imaging to them  Ulcerative rectosigmoiditis with rectal bleeding (HCC) Denies current flares or other issues Will update ESR/crp and iron studies per  patient request  Vitamin B12 deficiency Update B12 and provide recommendations   Vitamin D  deficiency Update vitamin D  and provide recommendations   Hyperlipidemia, unspecified hyperlipidemia type Update lipid panel and provide recommendations     I, Lavern Simmers, acting as a Neurosurgeon for [employer], [place]., have documented all relevant documentation on the behalf of Lucie Buttner, [place], as directed by Lucie Buttner, PA while in the presence of Lucie Buttner, [place].  I, Lucie Buttner, [place], have reviewed all documentation for this visit. The documentation on 06/19/24 for the exam, diagnosis, procedures, and orders are all accurate and complete.  Lucie Buttner, PA-C [facility]

## 2024-06-20 ENCOUNTER — Other Ambulatory Visit (INDEPENDENT_AMBULATORY_CARE_PROVIDER_SITE_OTHER)

## 2024-06-20 DIAGNOSIS — E538 Deficiency of other specified B group vitamins: Secondary | ICD-10-CM

## 2024-06-20 DIAGNOSIS — E785 Hyperlipidemia, unspecified: Secondary | ICD-10-CM | POA: Diagnosis not present

## 2024-06-20 DIAGNOSIS — E559 Vitamin D deficiency, unspecified: Secondary | ICD-10-CM | POA: Diagnosis not present

## 2024-06-20 DIAGNOSIS — K51311 Ulcerative (chronic) rectosigmoiditis with rectal bleeding: Secondary | ICD-10-CM | POA: Diagnosis not present

## 2024-06-20 DIAGNOSIS — Z0000 Encounter for general adult medical examination without abnormal findings: Secondary | ICD-10-CM | POA: Diagnosis not present

## 2024-06-20 LAB — LIPID PANEL
Cholesterol: 171 mg/dL (ref 0–200)
HDL: 52.7 mg/dL (ref >39.00)
LDL Cholesterol: 108 mg/dL — ABNORMAL HIGH (ref 0–99)
NonHDL: 118.17
Total CHOL/HDL Ratio: 3
Triglycerides: 53 mg/dL (ref 0.0–149.0)
VLDL: 10.6 mg/dL (ref 0.0–40.0)

## 2024-06-20 LAB — IBC + FERRITIN
Ferritin: 37.7 ng/mL (ref 22.0–322.0)
Iron: 163 ug/dL (ref 42–165)
Saturation Ratios: 51.5 % — ABNORMAL HIGH (ref 20.0–50.0)
TIBC: 316.4 ug/dL (ref 250.0–450.0)
Transferrin: 226 mg/dL (ref 212.0–360.0)

## 2024-06-20 LAB — CBC WITH DIFFERENTIAL/PLATELET
Basophils Absolute: 0 K/uL (ref 0.0–0.1)
Basophils Relative: 0.6 % (ref 0.0–3.0)
Eosinophils Absolute: 0.2 K/uL (ref 0.0–0.7)
Eosinophils Relative: 3.1 % (ref 0.0–5.0)
HCT: 38.9 % — ABNORMAL LOW (ref 39.0–52.0)
Hemoglobin: 13.2 g/dL (ref 13.0–17.0)
Lymphocytes Relative: 33.4 % (ref 12.0–46.0)
Lymphs Abs: 1.7 K/uL (ref 0.7–4.0)
MCHC: 33.9 g/dL (ref 30.0–36.0)
MCV: 92.1 fl (ref 78.0–100.0)
Monocytes Absolute: 0.3 K/uL (ref 0.1–1.0)
Monocytes Relative: 6.4 % (ref 3.0–12.0)
Neutro Abs: 2.9 K/uL (ref 1.4–7.7)
Neutrophils Relative %: 56.5 % (ref 43.0–77.0)
Platelets: 198 K/uL (ref 150.0–400.0)
RBC: 4.22 Mil/uL (ref 4.22–5.81)
RDW: 12.9 % (ref 11.5–15.5)
WBC: 5.1 K/uL (ref 4.0–10.5)

## 2024-06-20 LAB — COMPREHENSIVE METABOLIC PANEL WITH GFR
ALT: 43 U/L (ref 0–53)
AST: 49 U/L — ABNORMAL HIGH (ref 0–37)
Albumin: 4.4 g/dL (ref 3.5–5.2)
Alkaline Phosphatase: 62 U/L (ref 39–117)
BUN: 18 mg/dL (ref 6–23)
CO2: 31 meq/L (ref 19–32)
Calcium: 9.2 mg/dL (ref 8.4–10.5)
Chloride: 102 meq/L (ref 96–112)
Creatinine, Ser: 1.65 mg/dL — ABNORMAL HIGH (ref 0.40–1.50)
GFR: 54.02 mL/min — ABNORMAL LOW (ref >60.00)
Glucose, Bld: 91 mg/dL (ref 70–99)
Potassium: 4 meq/L (ref 3.5–5.1)
Sodium: 136 meq/L (ref 135–145)
Total Bilirubin: 1.1 mg/dL (ref 0.2–1.2)
Total Protein: 6.6 g/dL (ref 6.0–8.3)

## 2024-06-20 LAB — VITAMIN D 25 HYDROXY (VIT D DEFICIENCY, FRACTURES): VITD: 39.56 ng/mL (ref 30.00–100.00)

## 2024-06-20 LAB — C-REACTIVE PROTEIN: CRP: <1 mg/dL (ref 0.5–20.0)

## 2024-06-20 LAB — SEDIMENTATION RATE: Sed Rate: 3 mm/h (ref 0–15)

## 2024-06-20 LAB — VITAMIN B12: Vitamin B-12: 421 pg/mL (ref 211–911)

## 2024-06-21 ENCOUNTER — Ambulatory Visit: Payer: Self-pay | Admitting: Physician Assistant

## 2024-06-21 DIAGNOSIS — R944 Abnormal results of kidney function studies: Secondary | ICD-10-CM

## 2024-06-26 DIAGNOSIS — F902 Attention-deficit hyperactivity disorder, combined type: Secondary | ICD-10-CM | POA: Diagnosis not present

## 2024-06-26 DIAGNOSIS — F4322 Adjustment disorder with anxiety: Secondary | ICD-10-CM | POA: Diagnosis not present

## 2024-07-14 ENCOUNTER — Telehealth: Payer: Self-pay | Admitting: Physician Assistant

## 2024-07-14 NOTE — Telephone Encounter (Signed)
 Copied from CRM [id number]. Topic: Clinical - Request for Lab/Test Order >> Jul 14, 2024 10:59 AM Gennette ORN wrote: Patient stated he wants a STD testing done when he had his last physical but he checked my chart and noticed it wasn't done. He requesting a test.

## 2024-07-14 NOTE — Telephone Encounter (Signed)
 Please advise on future orders for STD testing; not showing this was discussed during annual CPE in July

## 2024-07-17 ENCOUNTER — Other Ambulatory Visit: Payer: Self-pay

## 2024-07-17 ENCOUNTER — Other Ambulatory Visit (INDEPENDENT_AMBULATORY_CARE_PROVIDER_SITE_OTHER)

## 2024-07-17 ENCOUNTER — Other Ambulatory Visit (HOSPITAL_COMMUNITY)
Admission: RE | Admit: 2024-07-17 | Discharge: 2024-07-17 | Disposition: A | Discharge status: Home / Self Care | Source: Ambulatory Visit | Attending: Physician Assistant | Admitting: Physician Assistant

## 2024-07-17 DIAGNOSIS — Z113 Encounter for screening for infections with a predominantly sexual mode of transmission: Secondary | ICD-10-CM

## 2024-07-17 DIAGNOSIS — R944 Abnormal results of kidney function studies: Secondary | ICD-10-CM | POA: Diagnosis not present

## 2024-07-17 LAB — COMPREHENSIVE METABOLIC PANEL WITH GFR
ALT: 21 U/L (ref 0–53)
AST: 17 U/L (ref 0–37)
Albumin: 4.7 g/dL (ref 3.5–5.2)
Alkaline Phosphatase: 80 U/L (ref 39–117)
BUN: 10 mg/dL (ref 6–23)
CO2: 31 meq/L (ref 19–32)
Calcium: 9.8 mg/dL (ref 8.4–10.5)
Chloride: 100 meq/L (ref 96–112)
Creatinine, Ser: 1.4 mg/dL (ref 0.40–1.50)
GFR: 65.76 mL/min (ref >60.00)
Glucose, Bld: 79 mg/dL (ref 70–99)
Potassium: 4.4 meq/L (ref 3.5–5.1)
Sodium: 137 meq/L (ref 135–145)
Total Bilirubin: 1 mg/dL (ref 0.2–1.2)
Total Protein: 7.1 g/dL (ref 6.0–8.3)

## 2024-07-17 NOTE — Telephone Encounter (Signed)
 MyChart message sent to patient advising of lab orders and to contact our office to schedule a lab only appointment at his convenience.

## 2024-07-17 NOTE — Telephone Encounter (Signed)
 Future lab orders placed; please contact pt to schedule lab only appt

## 2024-07-17 NOTE — Telephone Encounter (Signed)
 Called to schedule pt but VM full. Phone number was to [employer].

## 2024-07-18 ENCOUNTER — Ambulatory Visit: Payer: Self-pay | Admitting: Physician Assistant

## 2024-07-18 LAB — HIV ANTIBODY (ROUTINE TESTING W REFLEX): HIV 1&2 Ab, 4th Generation: NONREACTIVE

## 2024-07-18 LAB — URINE CYTOLOGY ANCILLARY ONLY
Chlamydia: NEGATIVE
Comment: NEGATIVE
Comment: NEGATIVE
Comment: NORMAL
Neisseria Gonorrhea: NEGATIVE
Trichomonas: NEGATIVE

## 2024-07-18 LAB — RPR: RPR Ser Ql: NONREACTIVE

## 2024-08-17 DIAGNOSIS — F4322 Adjustment disorder with anxiety: Secondary | ICD-10-CM | POA: Diagnosis not present

## 2024-08-17 DIAGNOSIS — F902 Attention-deficit hyperactivity disorder, combined type: Secondary | ICD-10-CM | POA: Diagnosis not present

## 2024-08-20 ENCOUNTER — Encounter: Payer: Self-pay | Admitting: Physician Assistant

## 2024-09-14 DIAGNOSIS — F4322 Adjustment disorder with anxiety: Secondary | ICD-10-CM | POA: Diagnosis not present

## 2024-09-14 DIAGNOSIS — F902 Attention-deficit hyperactivity disorder, combined type: Secondary | ICD-10-CM | POA: Diagnosis not present
